# Patient Record
Sex: Male | Born: 1950 | Race: White | Hispanic: No | Marital: Married | Smoking: Former smoker
Health system: Southern US, Community
[De-identification: ages and names within clinical notes are randomized; demographics above are authoritative.]

---

## 2019-12-17 DIAGNOSIS — I4891 Unspecified atrial fibrillation: Secondary | ICD-10-CM

## 2020-01-31 ENCOUNTER — Inpatient Hospital Stay (HOSPITAL_COMMUNITY): Payer: Medicare HMO

## 2020-01-31 ENCOUNTER — Inpatient Hospital Stay (HOSPITAL_COMMUNITY)
Admission: AD | Admit: 2020-01-31 | Discharge: 2020-02-17 | DRG: 207 | Disposition: E | Payer: Medicare HMO | Source: Other Acute Inpatient Hospital | Attending: Internal Medicine | Admitting: Internal Medicine

## 2020-01-31 DIAGNOSIS — N179 Acute kidney failure, unspecified: Secondary | ICD-10-CM | POA: Diagnosis present

## 2020-01-31 DIAGNOSIS — Z66 Do not resuscitate: Secondary | ICD-10-CM | POA: Diagnosis not present

## 2020-01-31 DIAGNOSIS — E669 Obesity, unspecified: Secondary | ICD-10-CM | POA: Diagnosis present

## 2020-01-31 DIAGNOSIS — G9341 Metabolic encephalopathy: Secondary | ICD-10-CM | POA: Diagnosis present

## 2020-01-31 DIAGNOSIS — G936 Cerebral edema: Secondary | ICD-10-CM | POA: Diagnosis present

## 2020-01-31 DIAGNOSIS — Z4659 Encounter for fitting and adjustment of other gastrointestinal appliance and device: Secondary | ICD-10-CM

## 2020-01-31 DIAGNOSIS — L8990 Pressure ulcer of unspecified site, unspecified stage: Secondary | ICD-10-CM | POA: Insufficient documentation

## 2020-01-31 DIAGNOSIS — G9340 Encephalopathy, unspecified: Secondary | ICD-10-CM

## 2020-01-31 DIAGNOSIS — E785 Hyperlipidemia, unspecified: Secondary | ICD-10-CM | POA: Diagnosis present

## 2020-01-31 DIAGNOSIS — R0600 Dyspnea, unspecified: Secondary | ICD-10-CM

## 2020-01-31 DIAGNOSIS — R220 Localized swelling, mass and lump, head: Secondary | ICD-10-CM | POA: Diagnosis not present

## 2020-01-31 DIAGNOSIS — J440 Chronic obstructive pulmonary disease with acute lower respiratory infection: Secondary | ICD-10-CM | POA: Diagnosis present

## 2020-01-31 DIAGNOSIS — J9601 Acute respiratory failure with hypoxia: Secondary | ICD-10-CM | POA: Diagnosis not present

## 2020-01-31 DIAGNOSIS — R0603 Acute respiratory distress: Secondary | ICD-10-CM | POA: Diagnosis not present

## 2020-01-31 DIAGNOSIS — J9621 Acute and chronic respiratory failure with hypoxia: Secondary | ICD-10-CM | POA: Diagnosis present

## 2020-01-31 DIAGNOSIS — Z6841 Body Mass Index (BMI) 40.0 and over, adult: Secondary | ICD-10-CM | POA: Diagnosis not present

## 2020-01-31 DIAGNOSIS — Z0189 Encounter for other specified special examinations: Secondary | ICD-10-CM

## 2020-01-31 DIAGNOSIS — F329 Major depressive disorder, single episode, unspecified: Secondary | ICD-10-CM | POA: Diagnosis present

## 2020-01-31 DIAGNOSIS — R4182 Altered mental status, unspecified: Secondary | ICD-10-CM | POA: Diagnosis not present

## 2020-01-31 DIAGNOSIS — Z9289 Personal history of other medical treatment: Secondary | ICD-10-CM

## 2020-01-31 DIAGNOSIS — I4891 Unspecified atrial fibrillation: Secondary | ICD-10-CM | POA: Diagnosis present

## 2020-01-31 DIAGNOSIS — R918 Other nonspecific abnormal finding of lung field: Secondary | ICD-10-CM | POA: Diagnosis not present

## 2020-01-31 DIAGNOSIS — Z7189 Other specified counseling: Secondary | ICD-10-CM | POA: Diagnosis not present

## 2020-01-31 DIAGNOSIS — Z515 Encounter for palliative care: Secondary | ICD-10-CM | POA: Diagnosis not present

## 2020-01-31 DIAGNOSIS — R739 Hyperglycemia, unspecified: Secondary | ICD-10-CM | POA: Diagnosis not present

## 2020-01-31 DIAGNOSIS — Z87891 Personal history of nicotine dependence: Secondary | ICD-10-CM

## 2020-01-31 DIAGNOSIS — I609 Nontraumatic subarachnoid hemorrhage, unspecified: Secondary | ICD-10-CM | POA: Diagnosis present

## 2020-01-31 DIAGNOSIS — Z79899 Other long term (current) drug therapy: Secondary | ICD-10-CM | POA: Diagnosis not present

## 2020-01-31 DIAGNOSIS — J9819 Other pulmonary collapse: Secondary | ICD-10-CM | POA: Diagnosis present

## 2020-01-31 DIAGNOSIS — J181 Lobar pneumonia, unspecified organism: Secondary | ICD-10-CM | POA: Diagnosis present

## 2020-01-31 DIAGNOSIS — R410 Disorientation, unspecified: Secondary | ICD-10-CM | POA: Diagnosis not present

## 2020-01-31 DIAGNOSIS — Z8616 Personal history of COVID-19: Secondary | ICD-10-CM

## 2020-01-31 DIAGNOSIS — G9389 Other specified disorders of brain: Secondary | ICD-10-CM

## 2020-01-31 DIAGNOSIS — C3431 Malignant neoplasm of lower lobe, right bronchus or lung: Secondary | ICD-10-CM | POA: Diagnosis present

## 2020-01-31 DIAGNOSIS — G4700 Insomnia, unspecified: Secondary | ICD-10-CM | POA: Diagnosis present

## 2020-01-31 DIAGNOSIS — Z452 Encounter for adjustment and management of vascular access device: Secondary | ICD-10-CM

## 2020-01-31 DIAGNOSIS — C7931 Secondary malignant neoplasm of brain: Secondary | ICD-10-CM

## 2020-01-31 DIAGNOSIS — J9600 Acute respiratory failure, unspecified whether with hypoxia or hypercapnia: Secondary | ICD-10-CM

## 2020-01-31 DIAGNOSIS — I10 Essential (primary) hypertension: Secondary | ICD-10-CM | POA: Diagnosis present

## 2020-01-31 DIAGNOSIS — R05 Cough: Secondary | ICD-10-CM | POA: Diagnosis not present

## 2020-01-31 DIAGNOSIS — J9589 Other postprocedural complications and disorders of respiratory system, not elsewhere classified: Secondary | ICD-10-CM

## 2020-01-31 DIAGNOSIS — E869 Volume depletion, unspecified: Secondary | ICD-10-CM | POA: Diagnosis present

## 2020-01-31 DIAGNOSIS — Z9981 Dependence on supplemental oxygen: Secondary | ICD-10-CM | POA: Diagnosis not present

## 2020-01-31 DIAGNOSIS — E870 Hyperosmolality and hypernatremia: Secondary | ICD-10-CM | POA: Diagnosis not present

## 2020-01-31 DIAGNOSIS — J9690 Respiratory failure, unspecified, unspecified whether with hypoxia or hypercapnia: Secondary | ICD-10-CM | POA: Diagnosis not present

## 2020-01-31 DIAGNOSIS — R579 Shock, unspecified: Secondary | ICD-10-CM | POA: Diagnosis not present

## 2020-01-31 DIAGNOSIS — D649 Anemia, unspecified: Secondary | ICD-10-CM | POA: Diagnosis present

## 2020-01-31 LAB — CBC
HCT: 35 % — ABNORMAL LOW (ref 39.0–52.0)
Hemoglobin: 10.8 g/dL — ABNORMAL LOW (ref 13.0–17.0)
MCH: 26 pg (ref 26.0–34.0)
MCHC: 30.9 g/dL (ref 30.0–36.0)
MCV: 84.1 fL (ref 80.0–100.0)
Platelets: 278 10*3/uL (ref 150–400)
RBC: 4.16 MIL/uL — ABNORMAL LOW (ref 4.22–5.81)
RDW: 15.9 % — ABNORMAL HIGH (ref 11.5–15.5)
WBC: 13.2 10*3/uL — ABNORMAL HIGH (ref 4.0–10.5)
nRBC: 0 % (ref 0.0–0.2)

## 2020-01-31 LAB — COMPREHENSIVE METABOLIC PANEL
ALT: 20 U/L (ref 0–44)
AST: 23 U/L (ref 15–41)
Albumin: 2.2 g/dL — ABNORMAL LOW (ref 3.5–5.0)
Alkaline Phosphatase: 108 U/L (ref 38–126)
Anion gap: 13 (ref 5–15)
BUN: 52 mg/dL — ABNORMAL HIGH (ref 8–23)
CO2: 23 mmol/L (ref 22–32)
Calcium: 9.4 mg/dL (ref 8.9–10.3)
Chloride: 99 mmol/L (ref 98–111)
Creatinine, Ser: 2.06 mg/dL — ABNORMAL HIGH (ref 0.61–1.24)
GFR calc Af Amer: 38 mL/min — ABNORMAL LOW (ref >60)
GFR calc non Af Amer: 33 mL/min — ABNORMAL LOW (ref >60)
Glucose, Bld: 95 mg/dL (ref 70–99)
Potassium: 3.9 mmol/L (ref 3.5–5.1)
Sodium: 135 mmol/L (ref 135–145)
Total Bilirubin: 1 mg/dL (ref 0.3–1.2)
Total Protein: 6.9 g/dL (ref 6.5–8.1)

## 2020-01-31 LAB — SARS CORONAVIRUS 2 (TAT 6-24 HRS): SARS Coronavirus 2: NEGATIVE

## 2020-01-31 IMAGING — MR MR HEAD W/O CM
14 series · 48 of 48 positions shown · non-contrast
Comparison: Prior head CT from [DATE].

CLINICAL DATA: Follow-up examination for subarachnoid hemorrhage.
History of right lung mass, smoking.

EXAM:
MRI HEAD WITHOUT CONTRAST
TECHNIQUE: Multiplanar, multiecho pulse sequences of the brain and surrounding
structures were obtained without intravenous contrast.

[Series 5: DWI · axial · 3.0mm · 1.04mm/px · z∈[-55,+104]mm · 7 of 118 slices shown (1 of 4)]
[im 1/118]
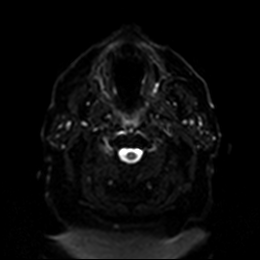
[im 20/118]
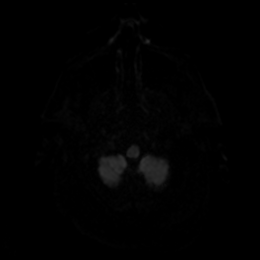
[im 40/118]
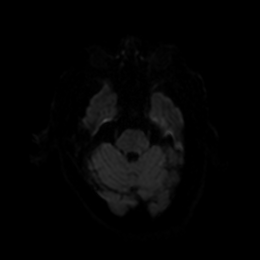
[im 59/118]
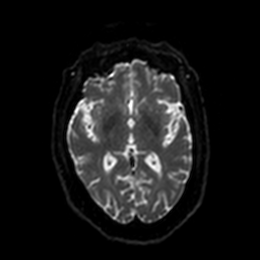
[im 79/118]
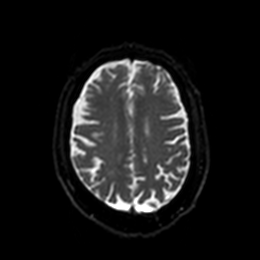
[im 98/118]
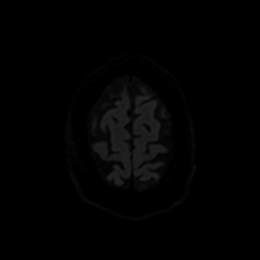
[im 118/118]
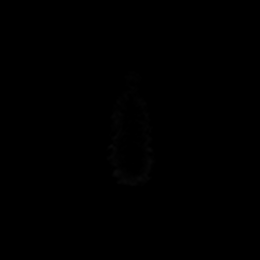

[Series 6: DWI · axial · 3.0mm · 1.04mm/px · z∈[-55,+104]mm · 4 of 59 slices shown (2 of 4)]
[im 1/59]
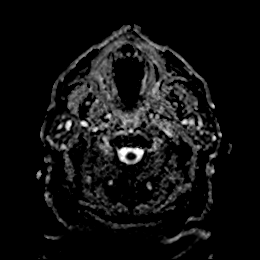
[im 20/59]
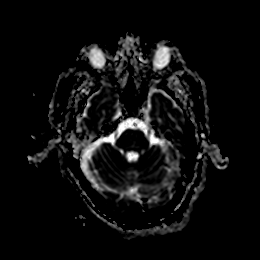
[im 39/59]
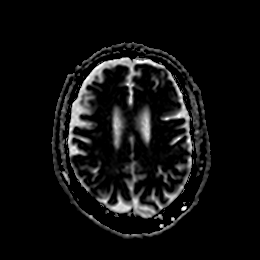
[im 59/59]
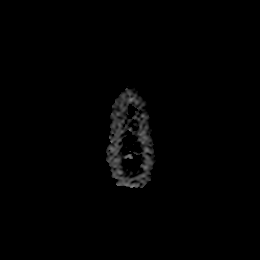

[Series 7: DWI · coronal · 4.0mm · 0.88mm/px · 6 of 78 slices shown (3 of 4)]
[im 1/78]
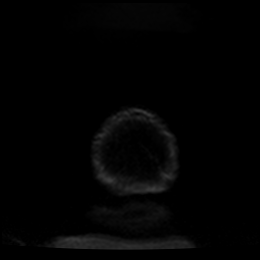
[im 16/78]
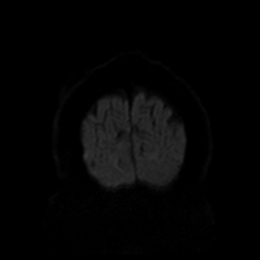
[im 31/78]
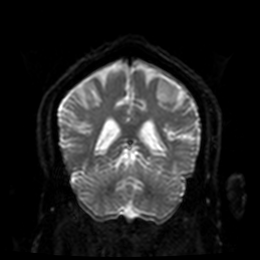
[im 47/78]
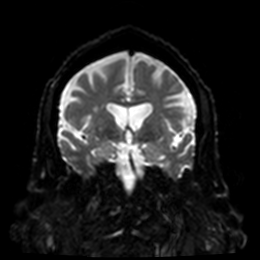
[im 62/78]
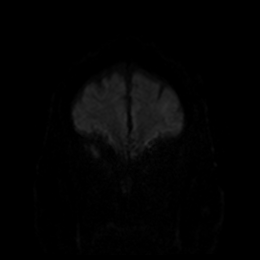
[im 78/78]
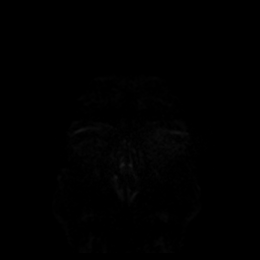

[Series 8: DWI · coronal · 4.0mm · 0.88mm/px · 3 of 39 slices shown (4 of 4)]
[im 1/39]
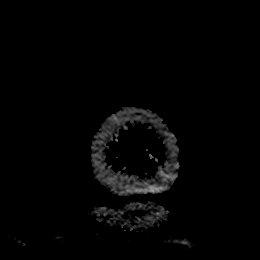
[im 20/39]
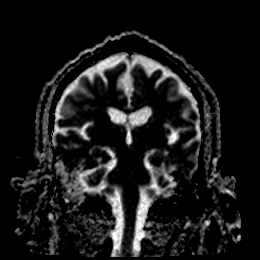
[im 39/39]
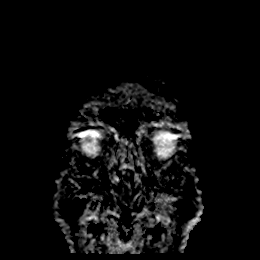

[Series 9: T1 · sagittal · 5.0mm · 0.81mm/px · 2 of 23 slices shown]
[im 1/23]
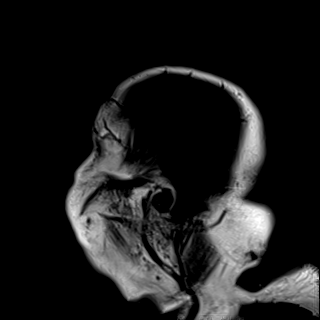
[im 23/23]
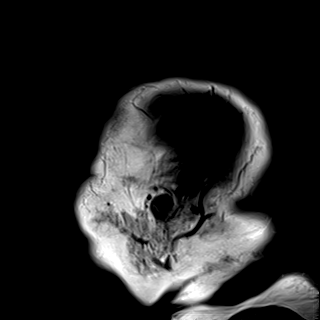

[Series 10: T2 · axial · 5.0mm · 0.75mm/px · z∈[-52,+80]mm · 2 of 25 slices shown (1 of 2)]
[im 1/25]
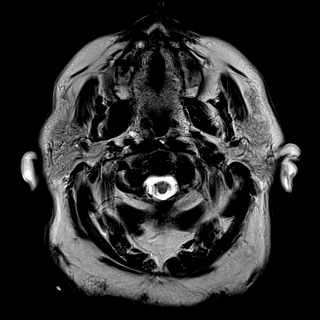
[im 25/25]
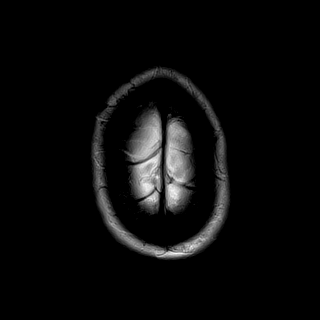

[Series 11: mag_images · axial · 3.0mm · 0.94mm/px · z∈[-57,+105]mm · 4 of 60 slices shown]
[im 1/60]
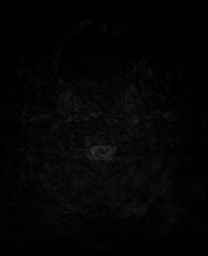
[im 20/60]
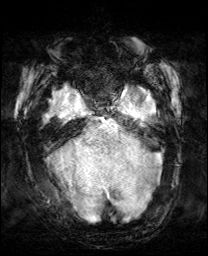
[im 40/60]
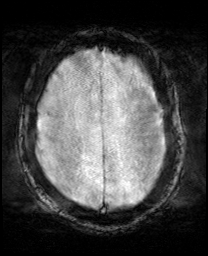
[im 60/60]
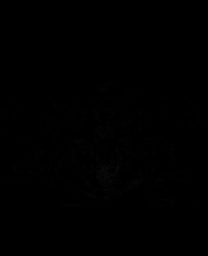

[Series 12: pha_images · axial · 3.0mm · 0.94mm/px · z∈[-54,+97]mm · 4 of 56 slices shown]
[im 1/56]
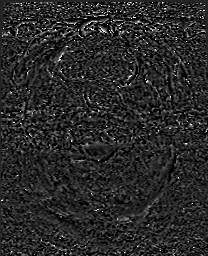
[im 19/56]
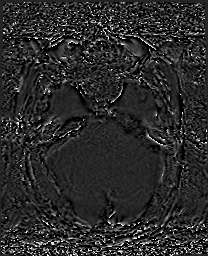
[im 37/56]
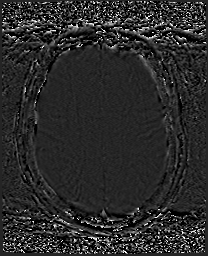
[im 56/56]
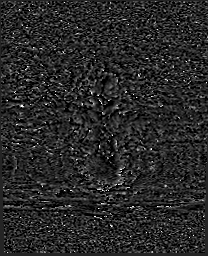

[Series 13: swi_images · axial · 3.0mm · 0.94mm/px · z∈[-57,+105]mm · 4 of 60 slices shown]
[im 1/60]
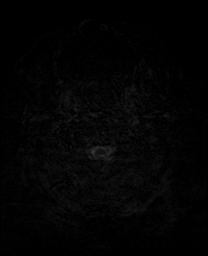
[im 20/60]
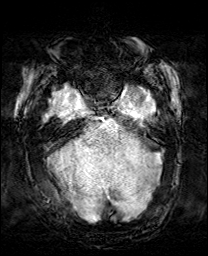
[im 40/60]
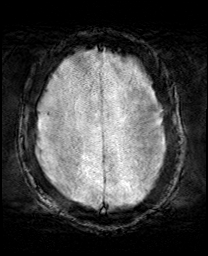
[im 60/60]
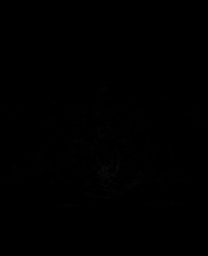

[Series 14: mip_images(sw) · axial · 24.0mm · 0.94mm/px · z∈[-47,+95]mm · 4 of 53 slices shown]
[im 1/53]
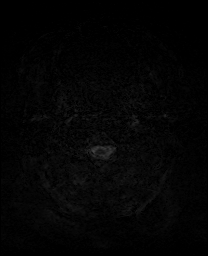
[im 18/53]
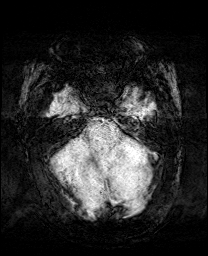
[im 35/53]
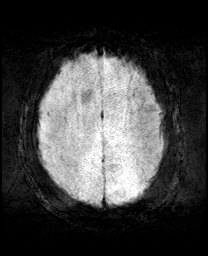
[im 53/53]
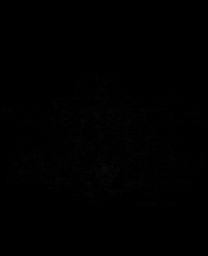

[Series 15: FLAIR · axial · 5.0mm · 0.90mm/px · z∈[-53,+78]mm · 2 of 25 slices shown (1 of 2)]
[im 1/25]
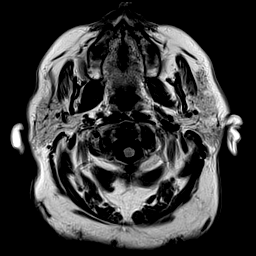
[im 25/25]
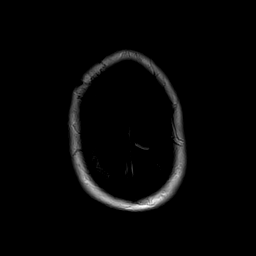

[Series 16: FLAIR · axial · 5.0mm · 0.90mm/px · z∈[-44,+87]mm · 2 of 25 slices shown (2 of 2)]
[im 1/25]
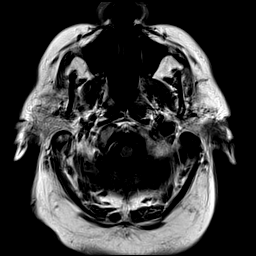
[im 25/25]
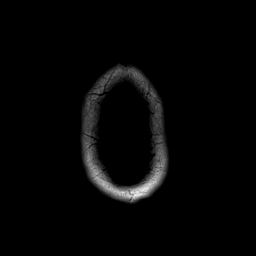

[Series 18: ax hemo · axial · 5.0mm · 0.86mm/px · z∈[-45,+86]mm · 2 of 25 slices shown]
[im 1/25]
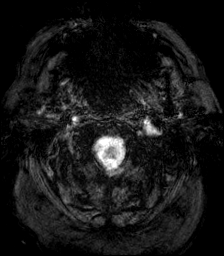
[im 25/25]
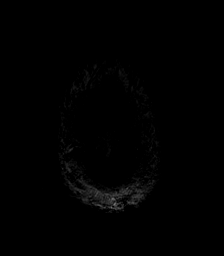

[Series 19: T2 · coronal · 5.0mm · 0.72mm/px · 2 of 29 slices shown (2 of 2)]
[im 1/29]
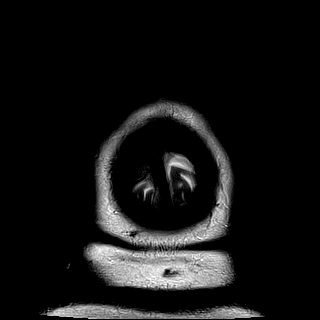
[im 29/29]
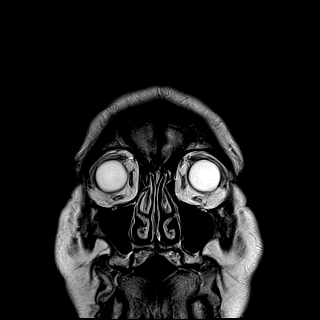

[48 of 48 positions shown; findings below may reference images not displayed]

FINDINGS: Brain: Examination moderately degraded by motion artifact.

Diffuse prominence of the CSF containing spaces compatible with
generalized age-related cerebral atrophy. Patchy T2/FLAIR
hyperintensity within the periventricular and deep white matter both
cerebral hemispheres most consistent with chronic small vessel
ischemic disease, mild to moderate in nature.

No abnormal foci of restricted diffusion to suggest acute or
subacute ischemia. No encephalomalacia to suggest chronic cortical
infarction.

There is an approximate 13 x 14 mm cortically based mass positioned
at the anterior left frontal lobe, corresponding with abnormality
seen on prior CT (series 15, image 17 associated susceptibility
artifact compatible with blood products, also seen on prior CT. No
other definite discrete masses identified on this noncontrast
examination. No midline shift. No hydrocephalus. No extra-axial
fluid collection. Pituitary gland suprasellar region normal. Midline
structures intact.). Exact measurements somewhat difficult given
lack of IV contrast and motion artifact. Small amount of localized
vasogenic edema without significant regional mass effect.

Vascular: Major intracranial vascular flow voids are maintained.

Skull and upper cervical spine: Craniocervical junction within
normal limits. Visualized upper cervical spine grossly unremarkable.
Bone marrow signal intensity within normal limits. No focal marrow
replacing lesion. Scalp soft tissues unremarkable.

Sinuses/Orbits: Patient status post ocular lens replacement on the
left. Globes and orbital soft tissues demonstrate no acute finding.
Paranasal sinuses are clear.

Other: Right mastoid and middle ear effusion noted, of uncertain
significance. Visualized nasopharynx grossly unremarkable.
IMPRESSION: 1. Approximate 14 mm cortically based hemorrhagic mass involving the
anterior left frontal lobe, corresponding with abnormality seen on
prior CT. Mild localized vasogenic edema without significant
regional mass effect. Finding is indeterminate, but most concerning
for a possible solitary intracranial metastasis given the patient's
pulmonary history. Primary CNS neoplasm could also be considered.
Further assessment with postcontrast imaging recommended for
complete evaluation.
2. Underlying mild age-related cerebral atrophy with
mild-to-moderate chronic microvascular ischemic disease.

## 2020-01-31 IMAGING — US US RENAL
1 series · 14 of 25 positions shown · non-contrast
Comparison: [DATE]

CLINICAL DATA: Acute renal insufficiency, renal cysts, renal
calculi

EXAM:
RENAL / URINARY TRACT ULTRASOUND COMPLETE

[Series 1: us renal · 43 acquisitions, 14 frames shown]
[im 1/43]
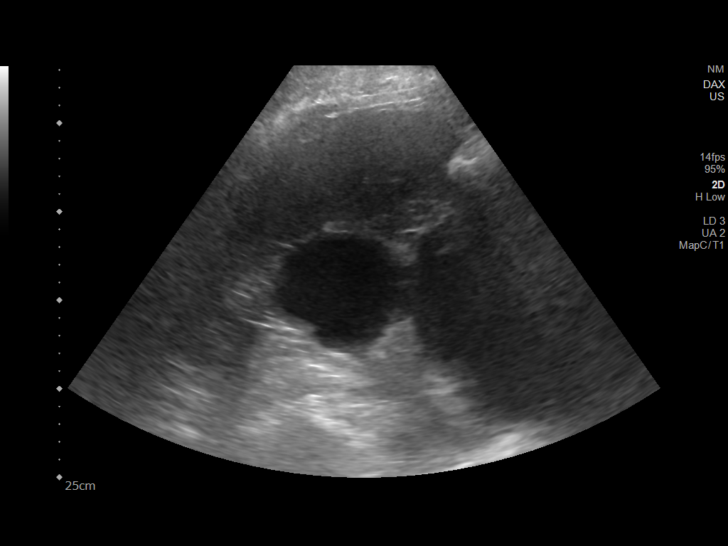
[im 4/43]
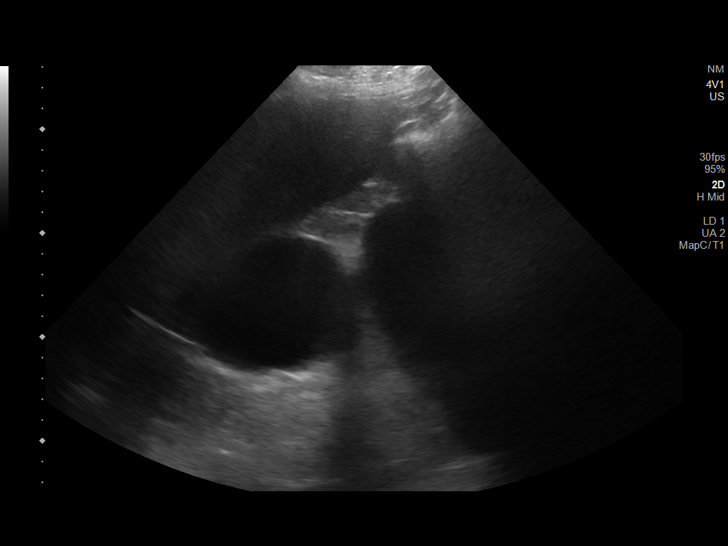
[im 8/43]
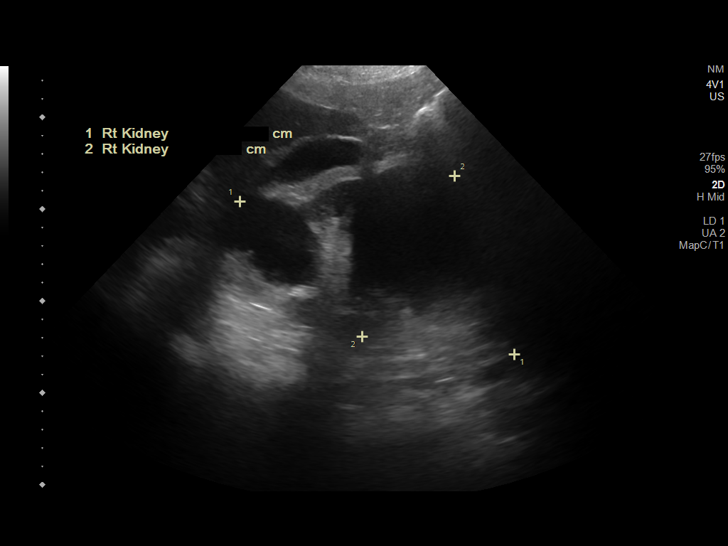
[im 11/43]
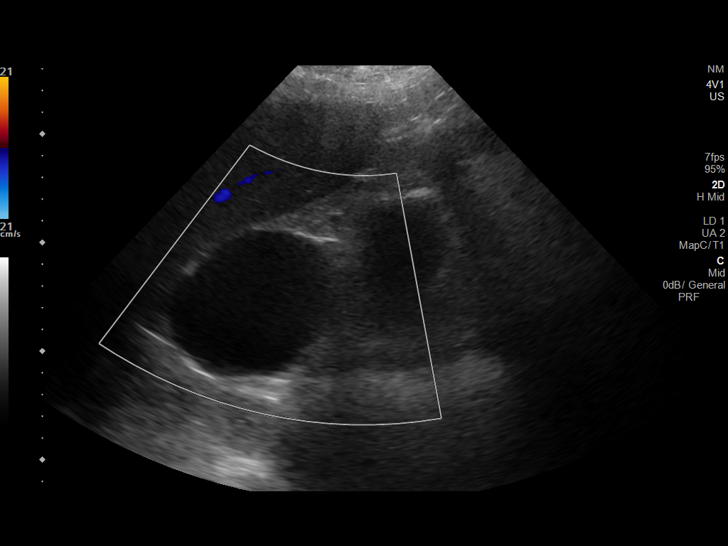
[im 15/43]
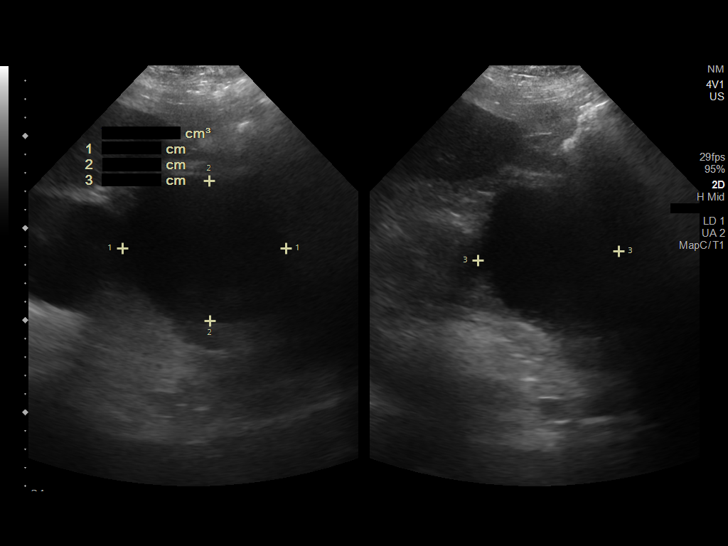
[im 16/43]
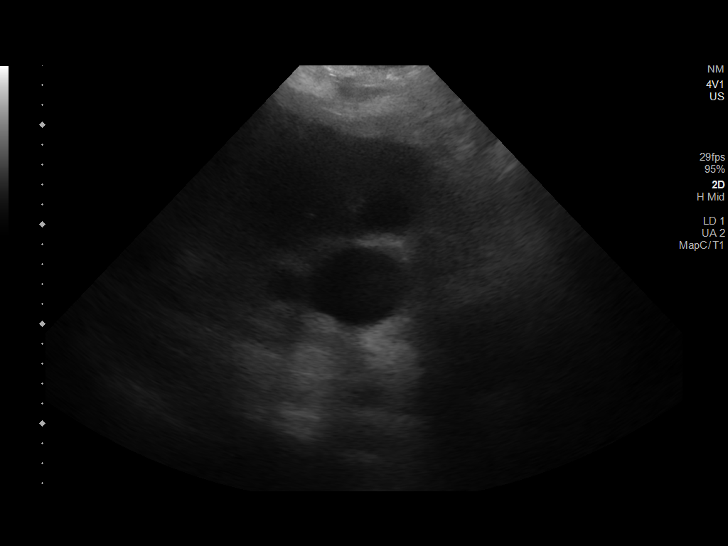
[im 20/43]
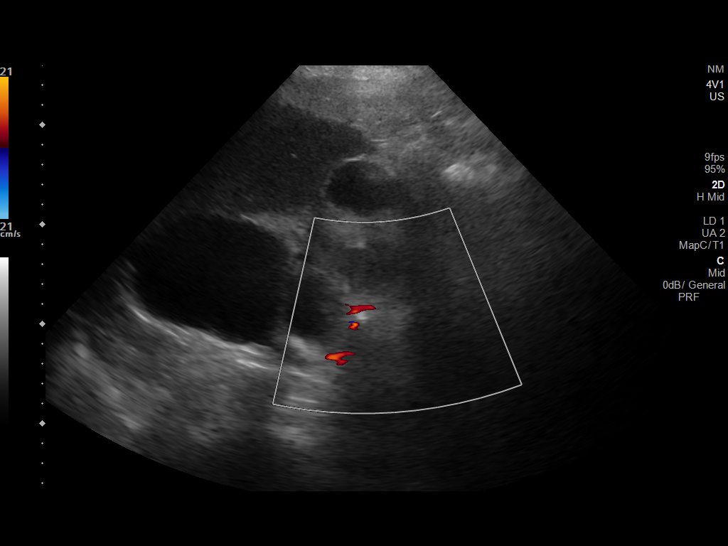
[im 23/43]
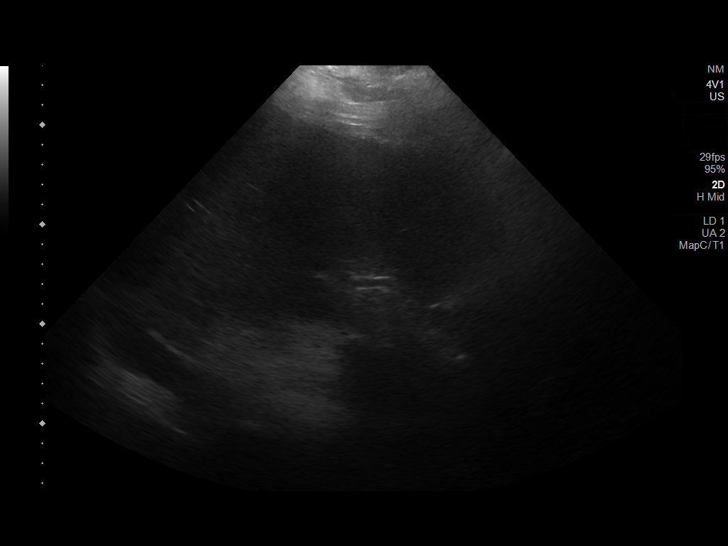
[im 27/43]
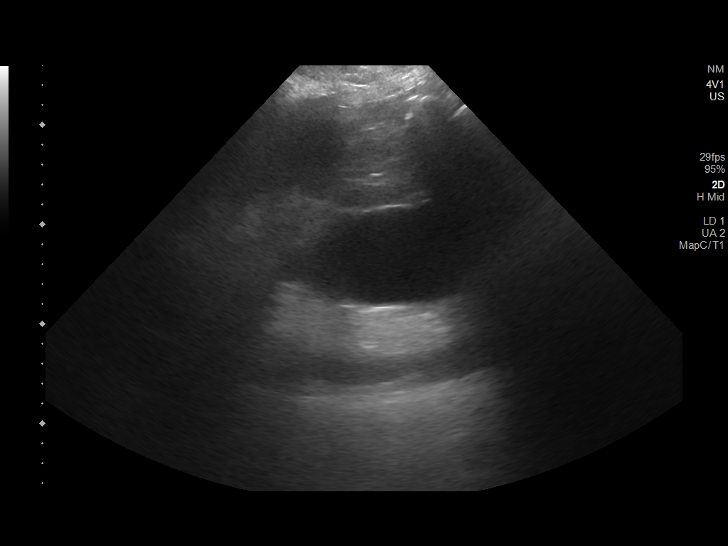
[im 29/43]
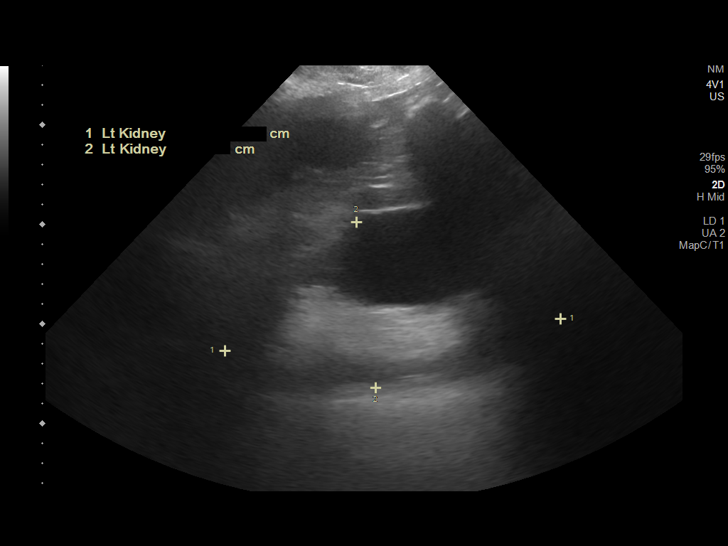
[im 32/43]
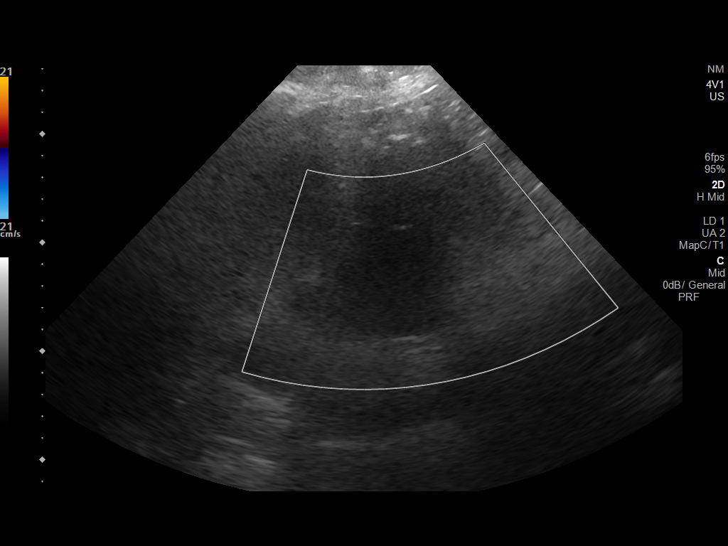
[im 36/43]
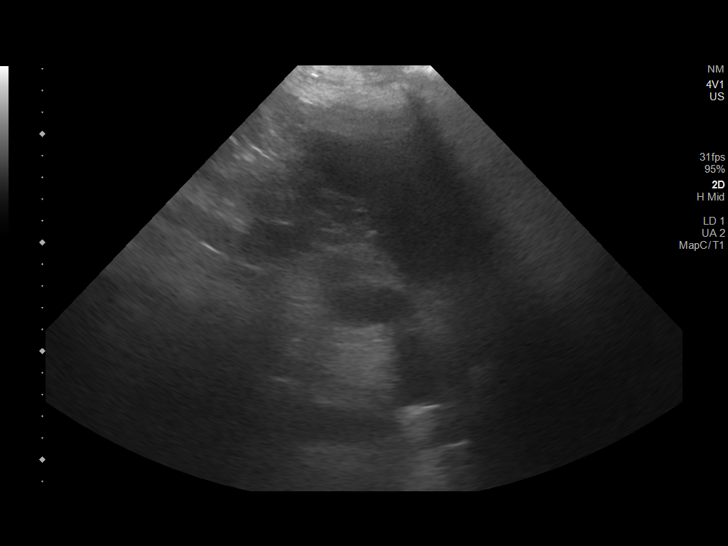
[im 39/43]
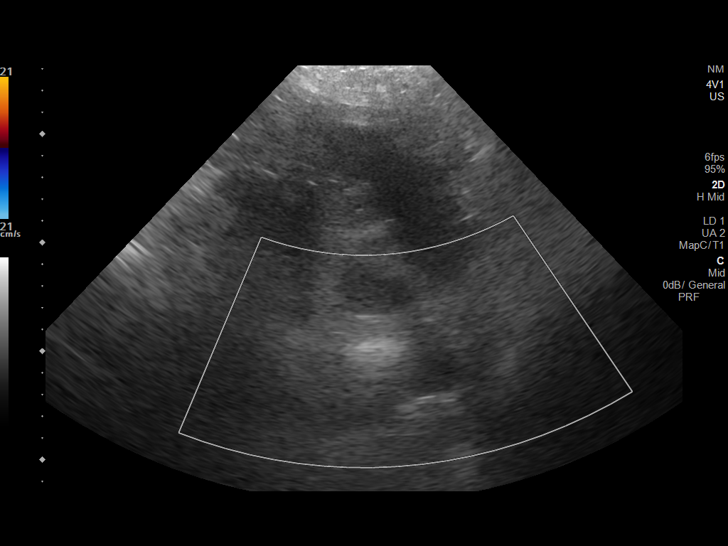
[im 43/43]
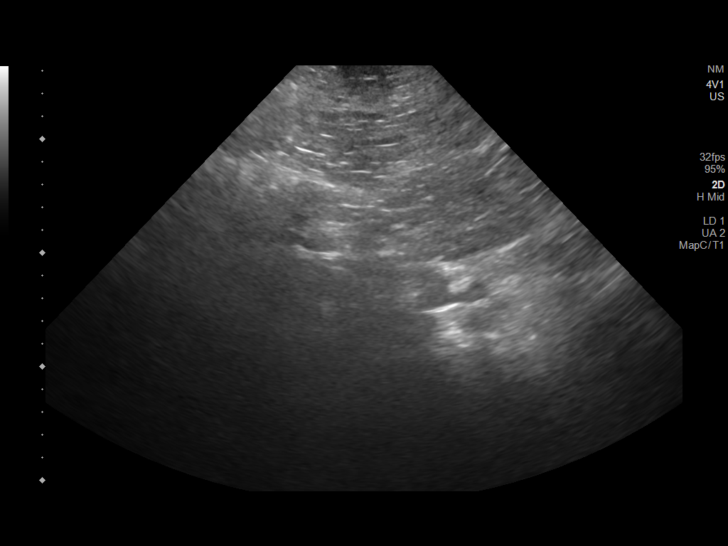

[14 of 25 positions shown; findings below may reference images not displayed]

FINDINGS: Right Kidney:

Renal measurements: 17.1 x 10.1 x 7.7 cm = volume: 696 mL. Numerous
right renal cysts are identified measuring up to 8.9 cm, unchanged
since recent CT. There is increased renal cortical echotexture.

Left Kidney:

Renal measurements: 16.9 x 8.4 by 7.1 cm = volume: 524 mL. Multiple
left renal cysts are identified largest measuring 6.5 cm. Mild
increased renal cortical echotexture.

Bladder:

The bladder is decompressed.

Other:

None.
IMPRESSION: 1. Numerous bilateral renal cortical cysts.
2. Increased renal cortical echotexture consistent with medical
renal disease.

## 2020-01-31 IMAGING — DX DG CHEST 1V PORT
1 series · 1 of 1 positions shown · non-contrast
Comparison: [DATE] [DATE] a.m, chest CT [DATE].

CLINICAL DATA: Dyspnea

EXAM:
PORTABLE CHEST 1 VIEW

[chest ap]
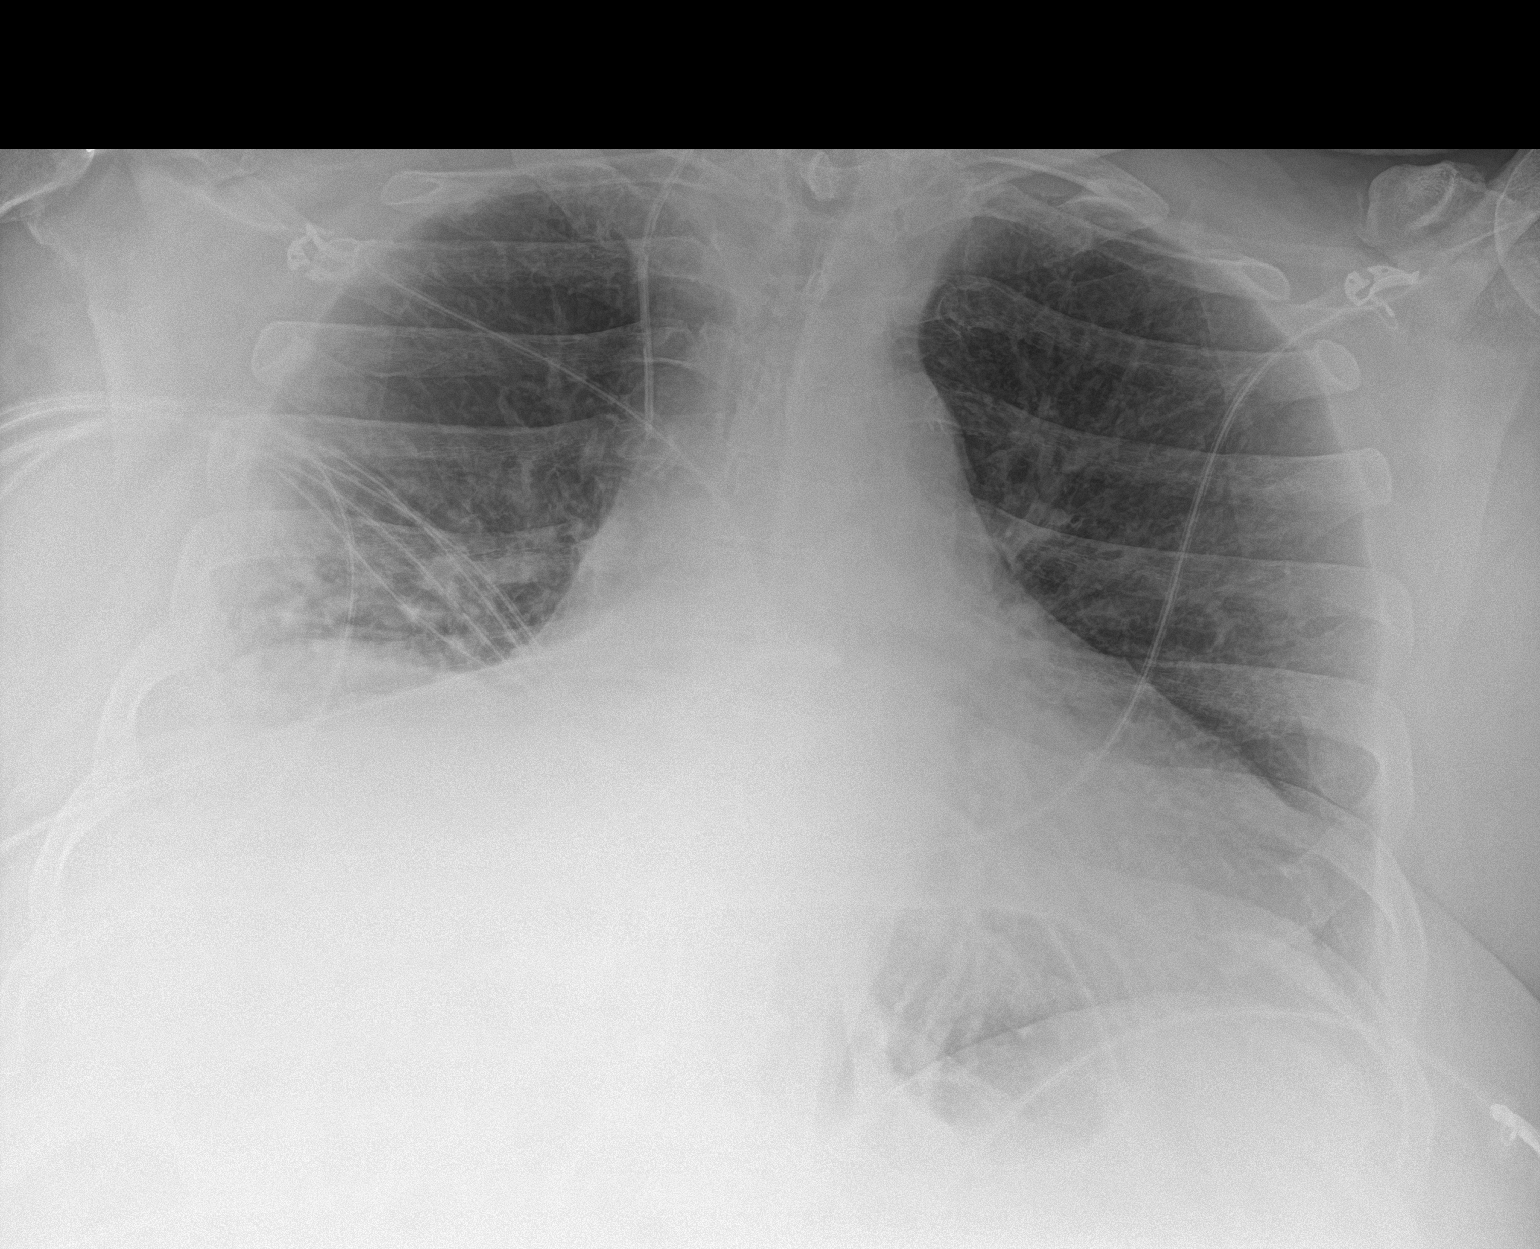

[1 of 1 positions shown; findings below may reference images not displayed]

FINDINGS: Single frontal view of the chest demonstrates stable right internal
jugular catheter tip overlying superior vena cava. The cardiac
silhouette is enlarged but stable. There is persistent but improving
right basilar airspace disease. No effusion or pneumothorax.
IMPRESSION: 1. Persistent but improving right basilar airspace disease, most
compatible with postobstructive bronchopneumonia. Please refer to
prior chest CT [DATE] describing a central obstructive right
hilar mass.

## 2020-01-31 MED ORDER — SODIUM CHLORIDE 0.9% FLUSH
10.0000 mL | Freq: Two times a day (BID) | INTRAVENOUS | Status: DC
  Administered 2020-01-31: 22:00:00 10 mL
  Administered 2020-02-01: 21:00:00 20 mL
  Administered 2020-02-01 – 2020-02-02 (×3): 10 mL

## 2020-01-31 MED ORDER — SODIUM CHLORIDE 0.9 % IV SOLN
1.0000 g | INTRAVENOUS | Status: DC
  Administered 2020-01-31: 21:00:00 1 g via INTRAVENOUS
  Filled 2020-01-31: qty 10

## 2020-01-31 MED ORDER — TRAZODONE HCL 50 MG PO TABS: 25.0000 mg | ORAL_TABLET | Freq: Every evening | ORAL | Status: DC | PRN

## 2020-01-31 MED ORDER — QUETIAPINE FUMARATE 25 MG PO TABS
25.0000 mg | ORAL_TABLET | Freq: Every day | ORAL | Status: AC
  Administered 2020-01-31 – 2020-02-01 (×2): 25 mg via ORAL
  Filled 2020-01-31 (×2): qty 1

## 2020-01-31 MED ORDER — IPRATROPIUM-ALBUTEROL 0.5-2.5 (3) MG/3ML IN SOLN
3.0000 mL | Freq: Four times a day (QID) | RESPIRATORY_TRACT | Status: DC
  Filled 2020-01-31: qty 3

## 2020-01-31 MED ORDER — SODIUM CHLORIDE 0.9 % IV SOLN: INTRAVENOUS | Status: DC

## 2020-01-31 MED ORDER — ACETAMINOPHEN 325 MG PO TABS
650.0000 mg | ORAL_TABLET | Freq: Four times a day (QID) | ORAL | Status: DC | PRN
  Administered 2020-02-02 – 2020-02-15 (×4): 650 mg via ORAL
  Filled 2020-01-31 (×4): qty 2

## 2020-01-31 MED ORDER — ONDANSETRON HCL 4 MG PO TABS
4.0000 mg | ORAL_TABLET | Freq: Four times a day (QID) | ORAL | Status: DC | PRN
  Filled 2020-01-31: qty 1

## 2020-01-31 MED ORDER — LEVETIRACETAM IN NACL 1000 MG/100ML IV SOLN
1000.0000 mg | INTRAVENOUS | Status: AC
  Administered 2020-01-31: 22:00:00 1000 mg via INTRAVENOUS
  Filled 2020-01-31: qty 100

## 2020-01-31 MED ORDER — SODIUM CHLORIDE 0.9 % IV SOLN
500.0000 mg | INTRAVENOUS | Status: DC
  Administered 2020-01-31 – 2020-02-08 (×9): 500 mg via INTRAVENOUS
  Filled 2020-01-31 (×10): qty 500

## 2020-01-31 MED ORDER — BUDESONIDE 0.25 MG/2ML IN SUSP
0.2500 mg | Freq: Two times a day (BID) | RESPIRATORY_TRACT | Status: DC
  Filled 2020-01-31 (×2): qty 2

## 2020-01-31 MED ORDER — ACETAMINOPHEN 650 MG RE SUPP: 650.0000 mg | Freq: Four times a day (QID) | RECTAL | Status: DC | PRN

## 2020-01-31 MED ORDER — LEVETIRACETAM 500 MG PO TABS
500.0000 mg | ORAL_TABLET | Freq: Two times a day (BID) | ORAL | Status: DC
  Administered 2020-02-01 (×2): 500 mg via ORAL
  Filled 2020-01-31 (×2): qty 1

## 2020-01-31 MED ORDER — CHLORHEXIDINE GLUCONATE CLOTH 2 % EX PADS
6.0000 | MEDICATED_PAD | Freq: Every day | CUTANEOUS | Status: DC
  Administered 2020-01-31 – 2020-02-05 (×4): 6 via TOPICAL

## 2020-01-31 MED ORDER — SODIUM CHLORIDE 0.9% FLUSH: 10.0000 mL | INTRAVENOUS | Status: DC | PRN

## 2020-01-31 MED ORDER — ONDANSETRON HCL 4 MG/2ML IJ SOLN: 4.0000 mg | Freq: Four times a day (QID) | INTRAMUSCULAR | Status: DC | PRN

## 2020-01-31 MED ORDER — IPRATROPIUM-ALBUTEROL 0.5-2.5 (3) MG/3ML IN SOLN
3.0000 mL | Freq: Four times a day (QID) | RESPIRATORY_TRACT | Status: DC | PRN
  Administered 2020-02-07: 3 mL via RESPIRATORY_TRACT
  Filled 2020-01-31: qty 3

## 2020-01-31 MED ORDER — TRAMADOL HCL 50 MG PO TABS: 50.0000 mg | ORAL_TABLET | Freq: Four times a day (QID) | ORAL | Status: DC | PRN

## 2020-01-31 NOTE — Plan of Care (Signed)

## 2020-01-31 NOTE — H&P (Addendum)
History and Physical  Shane Avila XIP:[id number] DOB: [birth year] DOA: 02/03/2020  PCP: Bonnita Nasuti, MD Patient coming from: [facility]   I have personally briefly reviewed patient's old medical records in [place]   Chief Complaint: Confusion.   HPI: Shane Avila is a 69 y.o. male past medical history significant for A. fib, right lung mass smoker who presented to [facility] complaining of insomnia for 4 days duration.  He took some Xanax on Monday night but that did not help him.  Patient report chronic cough, no fevers, denies chest pain denies worsening shortness of breath.  Per family patient was diagnosed with a lung mass he was supposed to have biopsy done but he went into cardiac arrest during initial attempt.  Biopsy was rescheduled as an outpatient but patient missed appointment '' due to his brain telling him that he got healed, he is a Environmental education officer now " patient wife noticed that patient has been acting with bizarre behavior and talking nonsense.  Evaluation at [facility]: Patient was found to have white blood cell of 15, hemoglobin 10, BUN 61, creatinine 3.7 blood pressure low 85/45, gross hematuria.  He received IV fluids.  CT head showed a small hyperdense area within the left frontal lobe which likely represent a small area of subarachnoid hemorrhage, however cannot exclude underlying mass lesion with a small area of hemorrhage.  Will recommend MRI with contrast for further evaluation.  Portable chest x-ray showed persistent right pleural effusion with adjacent right basilar atelectasis versus airspace disease.  Obscured right heart border limits evaluation of a known right hilar subcarinal mass.  CT abdomen and pelvis: Reticulonodular and tree in bud opacities with posterior right lung base consolidation, these findings are concerning for multifocal pneumonia.  Trace right pleural effusion.  Unchanged bilateral renal cyst.  Nonobstructing  right renal calculi.  Diverticulosis without diverticulitis, aortic atherosclerosis.   Review of Systems: All systems reviewed and apart from history of presenting illness, are negative.  No past medical history on file.  PMH;  A. 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   Family History; unable to obtain from patient.   Prior to Admission medications   Not on File   Physical Exam: There were no vitals filed for this visit.   General exam: Moderately built and nourished patient, lying comfortably supine on the gurney.  Head, eyes and ENT: Nontraumatic and normocephalic. Pupils equally reacting to light and accommodation. Oral mucosa dry   Neck: Supple. No JVD, carotid bruit or thyromegaly.  Lymphatics: No lymphadenopathy.  Respiratory system: bilateral ronchus, tachypnea.   Cardiovascular system: S1 and S2 heard, RRR. No JVD, murmurs, gallops, clicks, trace edema.  Gastrointestinal system: Abdomen is nondistended, soft and nontender. Normal bowel sounds heard. No organomegaly or masses appreciated.  Central nervous system: Alert and oriented. Follows command, confuse, upper extremity strength 5/5, LE he didn't want to move it due to pain.   Extremities: trace edema,   Musculoskeletal system no muscle atrophy   Psychiatry: Pleasant and cooperative. Confuse.    Labs on Admission:  Basic Metabolic Panel: No results for input(s): NA, K, CL, CO2, GLUCOSE, BUN, CREATININE, CALCIUM, MG, PHOS in the last 168 hours. Liver Function Tests: No results for input(s): AST, ALT, ALKPHOS, BILITOT, PROT, ALBUMIN in the last 168 hours. No results for input(s): LIPASE, AMYLASE in the last 168 hours.  No results for input(s): AMMONIA in the last 168 hours. CBC: No results for input(s): WBC, NEUTROABS, HGB, HCT, MCV, PLT in the last 168 hours. Cardiac Enzymes: No results for  input(s): CKTOTAL, CKMB, CKMBINDEX, TROPONINI in the last 168 hours.  BNP (last 3 results) No results for input(s): PROBNP in the last 8760 hours. CBG: No results for input(s): GLUCAP in the last 168 hours.  Radiological Exams on Admission: No results found.  EKG: Independently reviewed. A fib on monitor. Will order EKG  Assessment/Plan Active Problems:   SAH (subarachnoid hemorrhage) (HCC)   AKI (acute kidney injury) ([place])   Lung mass   Lobar pneumonia ([place])   1-AKI; Patient presents with cr at 3, BUN 60, per [facility] records.  -Suspect related to hypovolemia, in setting diuretics use (losartan NCHTZ) poor oral intake.  -Continue with IV fluids.  -Check renal US -Monitor urine out put. He has foley catheter in place with 800 cc urine in bag.  -Check UA.   2-SAH; per CT scan, cant rule out mass with hemorrhage.  Discussed with Dr Lorraine Lax with neurology, proceed with MRI , no contrast due to renal failure. If patient has a mass will need oncology /neurosurgery consultations. If [facility] neurology will see in consultation.  -Neuro check. -Monitor BP. Avoid Blood thinner.   3-Multifocal PNA;  Continue with ceftriaxone and azithromycin.   4-A fib;  Med list from [place] [facility] listed Nevibolol hold for now due to hypotension. Hold aspirin.   5-Acute hypoxic resp failure; lung mass, PNA; History of COPD.  Continue with oxygen supplement.  Check chest  Xray.  Duonebulizer.  Pulmicort.  Awaiting med rec.   6-Acute metabolic encephalopathy;  Multifactorial, infection, AKI, dehydration, SAH ?  MRI rule out Mass.   7-Lung Mass;  Might need evaluation by pulmonology. Check chest x ray.  8-Anemia; check anemia panel.   DVT Prophylaxis: SCD no anticoagulation in the event of possible SAH Code Status: Full code Family Communication: unable to contact family.  Disposition Plan:   Time spent: 75 minutes.   Elmarie Shiley MD [facility]   02/02/2020, 5:34 PM

## 2020-01-31 NOTE — Consult Note (Addendum)
Neurology Consultation  Reason for Consult: Altered mental status, abnormal brain imaging findings at an outside hospital Referring Physician: Dr Tyrell Antonio  CC: Altered mental status, abnormal brain imaging findings  History is obtained from: Chart review from outside hospital, EMR review  HPI: Shane Avila is a 69 y.o. male past medical history of atrial fibrillation, lung mass, history of smoking, coming in to [facility] for difficulty falling asleep and he says possible overuse of Xanax. He was noted to be not acting like himself.  I was unable to speak with any family where he has been appearing confused over the past few days. Brain imaging in the form of CT scan was performed at the outside hospital and there was a concern for a small left frontal hyperdensity-small subarachnoid versus mass. No reports of seizure activity. MRI was recommended upon phone consultation with my day colleague, and due to deranged renal function only brain MRI without contrast was done which revealed that lesion to be likely a solitary met given the history of lung cancer. Patient continues to report that he knows that he is at [facility] and he is in the hospital because he had taken a few extra Xanax because he has been having a difficult time sleeping.  He also complains of severe back pain and leg pain due to his chronic arthritis.  He seems to be aware that he has a lung mass but does not know if that is cancerous or not.    ROS: Unable to reliably obtain  No past medical history on file. Past medical history as documented above  No family history on file. Unable to provide  Social History:   has no history on file for tobacco, alcohol, and drug. Past history of smoking documented in the H&P.  Unable to ascertain history of drug or tobacco abuse but urinary toxicology screen from the outside hospital was positive for THC and benzos.  Medications  Current Facility-Administered  Medications:  .  0.9 %  sodium chloride infusion, , Intravenous, Continuous, Regalado, Belkys A, MD, Last Rate: 75 mL/hr at 01/20/2020 1804, New Bag at 02/11/2020 1804 .  acetaminophen (TYLENOL) tablet 650 mg, 650 mg, Oral, Q6H PRN **OR** acetaminophen (TYLENOL) suppository 650 mg, 650 mg, Rectal, Q6H PRN, Regalado, Belkys A, MD .  azithromycin (ZITHROMAX) 500 mg in sodium chloride 0.9 % 250 mL IVPB, 500 mg, Intravenous, Q24H, Regalado, Belkys A, MD, Last Rate: 250 mL/hr at 01/23/2020 2000, 500 mg at 01/30/2020 2000 .  budesonide (PULMICORT) nebulizer solution 0.25 mg, 0.25 mg, Nebulization, BID, Regalado, Belkys A, MD .  cefTRIAXone (ROCEPHIN) 1 g in sodium chloride 0.9 % 100 mL IVPB, 1 g, Intravenous, Q24H, Regalado, Belkys A, MD .  ipratropium-albuterol (DUONEB) 0.5-2.5 (3) MG/3ML nebulizer solution 3 mL, 3 mL, Nebulization, Q6H, Regalado, Belkys A, MD .  ondansetron (ZOFRAN) tablet 4 mg, 4 mg, Oral, Q6H PRN **OR** ondansetron (ZOFRAN) injection 4 mg, 4 mg, Intravenous, Q6H PRN, Regalado, Belkys A, MD .  traMADol (ULTRAM) tablet 50 mg, 50 mg, Oral, Q6H PRN, Regalado, Belkys A, MD .  traZODone (DESYREL) tablet 25 mg, 25 mg, Oral, QHS PRN, Regalado, Belkys A, MD  Exam: Current vital signs: BP 91/65   Pulse 82   Temp 98.2 F (36.8 C) (Oral)   Resp (!) 26   SpO2 97%  Vital signs in last 24 hours: Temp:  [98.2 F (36.8 C)] 98.2 F (36.8 C) (02/12 1943) Pulse Rate:  [82-90] 82 (02/12 2030) Resp:  [16-26]  26 (02/12 2030) BP: (91-102)/(50-66) 91/65 (02/12 2030) SpO2:  [95 %-100 %] 97 % (02/12 2030) General: Very pale appearing obese man sleeping in bed opens eyes to voice and follows commands. HEENT: Cephalic atraumatic Lungs: Rhonchorous sounding chest bilaterally Cardiovascular: Regular rate rhythm Abdomen: Obese Extremities: Palms of both hands have a bluish hue, he has pedal edema bilaterally. Neurological exam Awake alert oriented to the fact that he is in the hospital, himself but not to  the exact date and time. Speech is not dysarthric. There is no evidence of aphasia but he has poor attention concentration. Cranial: Pupils equal round react light, extract movements intact, visual fields appear full, face is symmetric, tongue and palate midline, articulate intact. Motor exam: Both upper extremities are antigravity without any drift.  He refused to participate with a lower extremity exam citing severe pain as the reason but says he is able to feel me touching both his legs symmetrically. Sensory exam: Intact to light touch all over Coordination: Difficult to perform but no gross dysmetria in the upper extremities.  Did not perform any testing in the lower extremities Gait testing was deferred due to pain discomfort and patient cooperation.  Of note, on my evaluation the first systolic blood pressure was in the 50s.  Repeat blood pressure testing revealed systolic in the 09U.   Labs I have reviewed labs in epic and the results pertinent to this consultation are:  CBC    Component Value Date/Time   WBC 13.2 (H) 01/21/2020 1727   RBC 4.16 (L) 01/28/2020 1727   HGB 10.8 (L) 01/20/2020 1727   HCT 35.0 (L) 02/08/2020 1727   PLT 278 02/11/2020 1727   MCV 84.1 01/29/2020 1727   MCH 26.0 02/06/2020 1727   MCHC 30.9 02/15/2020 1727   RDW 15.9 (H) 01/27/2020 1727    CMP     Component Value Date/Time   NA 135 01/30/2020 1727   K 3.9 01/26/2020 1727   CL 99 02/05/2020 1727   CO2 23 01/29/2020 1727   GLUCOSE 95 01/25/2020 1727   BUN 52 (H) 01/27/2020 1727   CREATININE 2.06 (H) 01/27/2020 1727   CALCIUM 9.4 02/13/2020 1727   PROT 6.9 02/02/2020 1727   ALBUMIN 2.2 (L) 02/14/2020 1727   AST 23 01/30/2020 1727   ALT 20 02/01/2020 1727   ALKPHOS 108 02/09/2020 1727   BILITOT 1.0 02/13/2020 1727   GFRNONAA 33 (L) 01/21/2020 1727   GFRAA 38 (L) 01/26/2020 1727   Some pertinent outside labs include CRP of greater than 270, proBNP elevated in the 2000.   Imaging I have  reviewed the images obtained:  CT-scan of the brain reviewed in PACS.  Small area of hyperdensity in the left frontal lobe concerning for a subarachnoid versus an underlying mass MRI examination of the brain done at [facility] without contrast due to deranged renal function-reveals a 14 mm cortically based hemorrhagic mass in the anterior left frontal lobe corresponding with an abnormality seen on the prior CT along with mild localized vasogenic edema without significant regional mass-effect.  It is an indeterminate finding but most concerning for possible solitary intracranial meta stasis given the history of the lung findings.  Primary CNS neoplasm could also be considered.  Further imaging with contrast should be performed when able to.  Assessment:  69 year old male past history of atrial fibrillation, lung mass, chronic history of smoking, insomnia with chronic pain medication and sleep medication use, presenting for evaluation of confusion outside hospital noted  to have a suspicious finding for a possible subarachnoid versus mass in the left frontal lobe. Further imaging by MRI reveals a possible hemorrhagic metastatic lesion in the left frontal lobe which is cortically based. Most likely his confusion is secondary to this frontal lesion. Rule out seizure given the cortical location.  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 -I would hold off on adding steroids as he is already very insomniac and appears mildly agitated.  There is very mild local mass-effect based on imaging and I would wait for an oncology/neuro-oncology consultation prior to initiating steroid treatment -I would also avoid  sedating medications.  For sleep, I would recommend using low-dose Seroquel. -Gentle fluid resuscitation-500 cc normal saline for correction of hypotension.  Further management per primary team. -Once renal function improves with GFR above 40, do an MRI brain with and without contrast at that time to get a clear picture of this lesion.  I have discussed my plan with the on-call advanced provider, Rhunette Croft Blount-NP, for the [facility] team.  Please call neurology with questions  -- Amie Portland, MD [facility] Pager: [phone] If 7pm to 7am, please call on call as listed on AMION.

## 2020-01-31 NOTE — Plan of Care (Signed)
°  Problem: Coping: °Goal: Level of anxiety will decrease °Outcome: Progressing °  °

## 2020-02-01 ENCOUNTER — Inpatient Hospital Stay (HOSPITAL_COMMUNITY): Payer: Medicare HMO

## 2020-02-01 ENCOUNTER — Encounter (HOSPITAL_COMMUNITY): Payer: Self-pay | Admitting: Internal Medicine

## 2020-02-01 DIAGNOSIS — G9389 Other specified disorders of brain: Secondary | ICD-10-CM

## 2020-02-01 DIAGNOSIS — G9340 Encephalopathy, unspecified: Secondary | ICD-10-CM

## 2020-02-01 DIAGNOSIS — R918 Other nonspecific abnormal finding of lung field: Secondary | ICD-10-CM

## 2020-02-01 DIAGNOSIS — R4182 Altered mental status, unspecified: Secondary | ICD-10-CM

## 2020-02-01 LAB — URINALYSIS, ROUTINE W REFLEX MICROSCOPIC
Bacteria, UA: NONE SEEN
Bilirubin Urine: NEGATIVE
Glucose, UA: NEGATIVE mg/dL
Ketones, ur: NEGATIVE mg/dL
Nitrite: NEGATIVE
Protein, ur: NEGATIVE mg/dL
RBC / HPF: >50 RBC/hpf — ABNORMAL HIGH (ref 0–5)
Specific Gravity, Urine: 1.014 (ref 1.005–1.030)
pH: 6 (ref 5.0–8.0)

## 2020-02-01 LAB — RETICULOCYTES
Immature Retic Fract: 12.4 % (ref 2.3–15.9)
RBC.: 3.91 MIL/uL — ABNORMAL LOW (ref 4.22–5.81)
Retic Count, Absolute: 40.3 10*3/uL (ref 19.0–186.0)
Retic Ct Pct: 1 % (ref 0.4–3.1)

## 2020-02-01 LAB — CBC
HCT: 33.4 % — ABNORMAL LOW (ref 39.0–52.0)
Hemoglobin: 10.1 g/dL — ABNORMAL LOW (ref 13.0–17.0)
MCH: 25.7 pg — ABNORMAL LOW (ref 26.0–34.0)
MCHC: 30.2 g/dL (ref 30.0–36.0)
MCV: 85 fL (ref 80.0–100.0)
Platelets: 274 10*3/uL (ref 150–400)
RBC: 3.93 MIL/uL — ABNORMAL LOW (ref 4.22–5.81)
RDW: 15.9 % — ABNORMAL HIGH (ref 11.5–15.5)
WBC: 11.4 10*3/uL — ABNORMAL HIGH (ref 4.0–10.5)
nRBC: 0 % (ref 0.0–0.2)

## 2020-02-01 LAB — COMPREHENSIVE METABOLIC PANEL
ALT: 20 U/L (ref 0–44)
AST: 21 U/L (ref 15–41)
Albumin: 2.1 g/dL — ABNORMAL LOW (ref 3.5–5.0)
Alkaline Phosphatase: 111 U/L (ref 38–126)
Anion gap: 12 (ref 5–15)
BUN: 42 mg/dL — ABNORMAL HIGH (ref 8–23)
CO2: 25 mmol/L (ref 22–32)
Calcium: 9.1 mg/dL (ref 8.9–10.3)
Chloride: 100 mmol/L (ref 98–111)
Creatinine, Ser: 1.45 mg/dL — ABNORMAL HIGH (ref 0.61–1.24)
GFR calc Af Amer: 58 mL/min — ABNORMAL LOW (ref >60)
GFR calc non Af Amer: 50 mL/min — ABNORMAL LOW (ref >60)
Glucose, Bld: 86 mg/dL (ref 70–99)
Potassium: 3.6 mmol/L (ref 3.5–5.1)
Sodium: 137 mmol/L (ref 135–145)
Total Bilirubin: 1 mg/dL (ref 0.3–1.2)
Total Protein: 6.2 g/dL — ABNORMAL LOW (ref 6.5–8.1)

## 2020-02-01 LAB — MAGNESIUM: Magnesium: 1.9 mg/dL (ref 1.7–2.4)

## 2020-02-01 LAB — IRON AND TIBC
Iron: 21 ug/dL — ABNORMAL LOW (ref 45–182)
Saturation Ratios: 12 % — ABNORMAL LOW (ref 17.9–39.5)
TIBC: 181 ug/dL — ABNORMAL LOW (ref 250–450)
UIBC: 160 ug/dL

## 2020-02-01 LAB — HIV ANTIBODY (ROUTINE TESTING W REFLEX): HIV Screen 4th Generation wRfx: NONREACTIVE

## 2020-02-01 LAB — FERRITIN: Ferritin: 212 ng/mL (ref 24–336)

## 2020-02-01 LAB — PROTIME-INR
INR: 1.3 — ABNORMAL HIGH (ref 0.8–1.2)
Prothrombin Time: 16.1 seconds — ABNORMAL HIGH (ref 11.4–15.2)

## 2020-02-01 LAB — FOLATE: Folate: 2.9 ng/mL — ABNORMAL LOW (ref >5.9)

## 2020-02-01 LAB — VITAMIN B12: Vitamin B-12: 227 pg/mL (ref 180–914)

## 2020-02-01 IMAGING — MR MR HEAD W/O CM
9 series · 48 of 48 positions shown · non-contrast
Comparison: Brain MRI [DATE]

CLINICAL DATA: Brain mass or lesion. Additional history obtained
from electronic medical record: History of lung mass

EXAM:
MRI HEAD WITHOUT CONTRAST
TECHNIQUE: Multiplanar, multiecho pulse sequences of the brain and surrounding
structures were obtained without intravenous contrast.

[Series 5: DWI · axial · 3.0mm · 0.88mm/px · z∈[-76,+60]mm · 14 of 96 slices shown (1 of 4)]
[im 1/96]
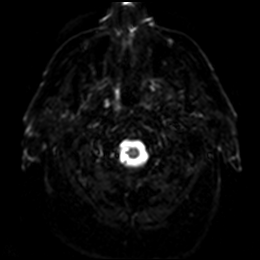
[im 8/96]
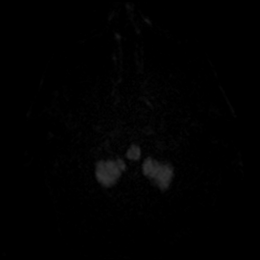
[im 15/96]
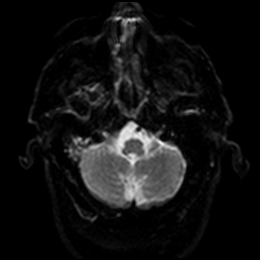
[im 22/96]
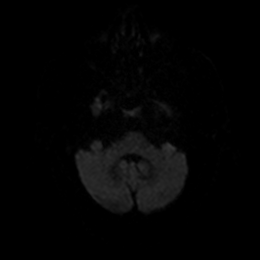
[im 30/96]
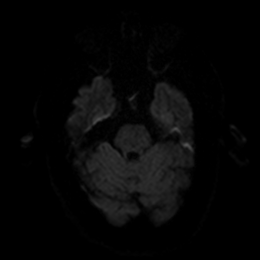
[im 37/96]
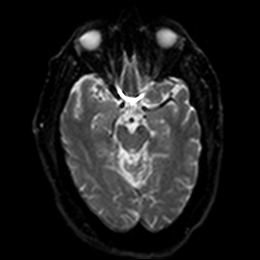
[im 44/96]
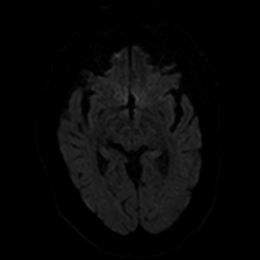
[im 52/96]
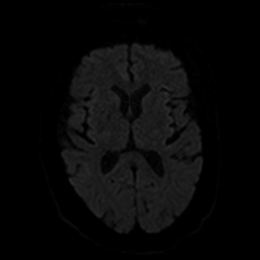
[im 59/96]
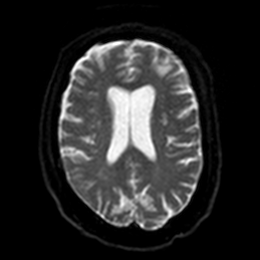
[im 66/96]
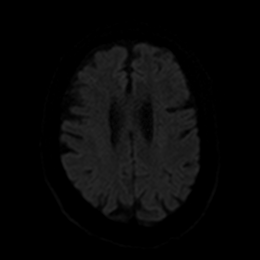
[im 74/96]
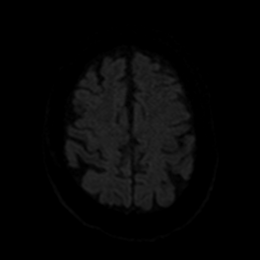
[im 81/96]
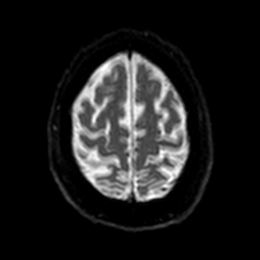
[im 88/96]
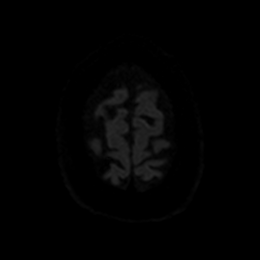
[im 96/96]
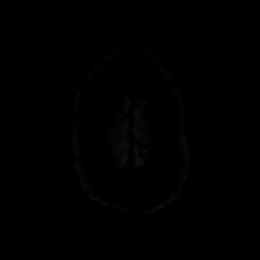

[Series 6: DWI · axial · 3.0mm · 0.88mm/px · z∈[-76,+60]mm · 6 of 48 slices shown (2 of 4)]
[im 1/48]
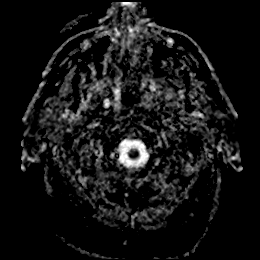
[im 10/48]
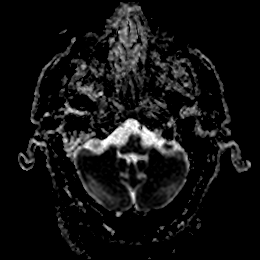
[im 19/48]
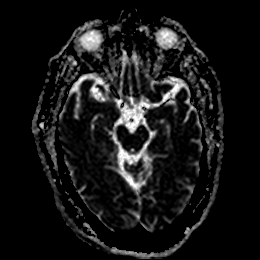
[im 29/48]
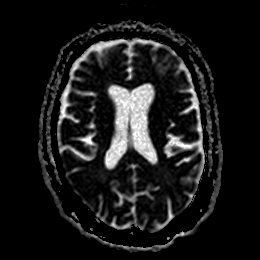
[im 38/48]
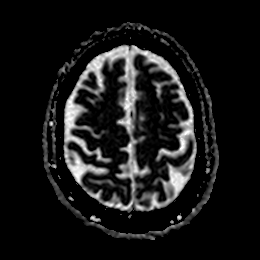
[im 48/48]
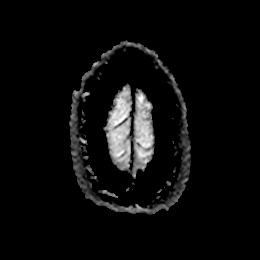

[Series 7: DWI · coronal · 4.0mm · 0.88mm/px · 9 of 72 slices shown (3 of 4)]
[im 1/72]
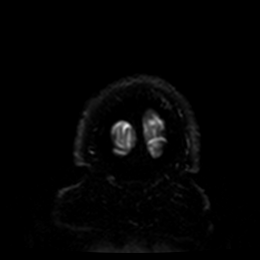
[im 9/72]
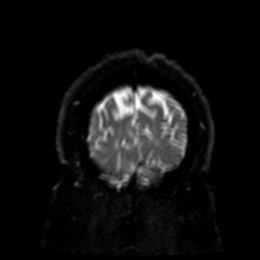
[im 18/72]
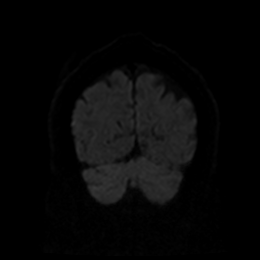
[im 27/72]
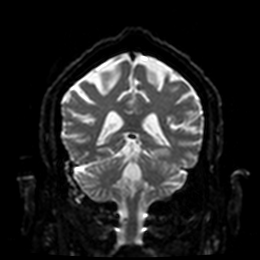
[im 36/72]
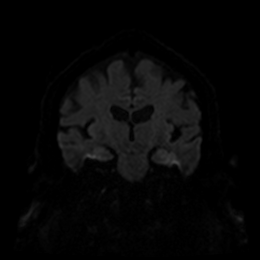
[im 45/72]
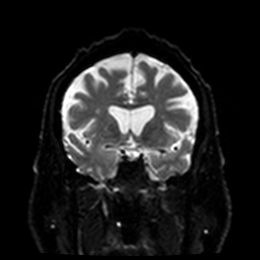
[im 54/72]
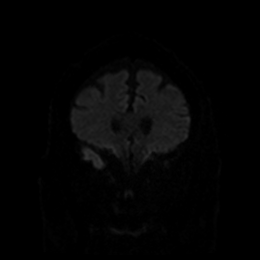
[im 63/72]
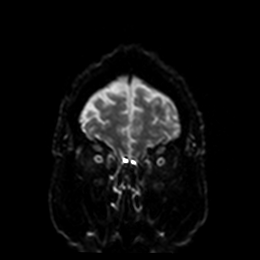
[im 72/72]
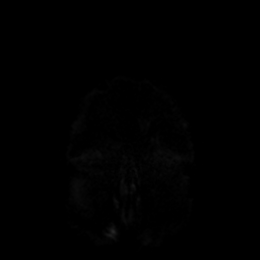

[Series 8: DWI · coronal · 4.0mm · 0.88mm/px · 5 of 36 slices shown (4 of 4)]
[im 1/36]
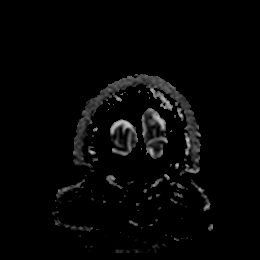
[im 9/36]
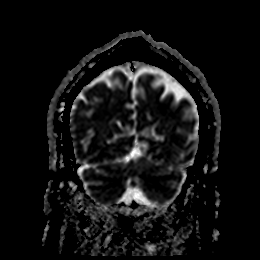
[im 18/36]
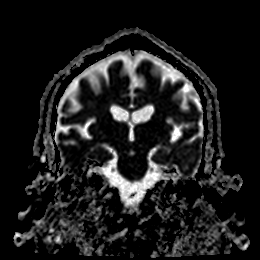
[im 27/36]
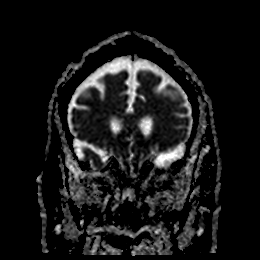
[im 36/36]
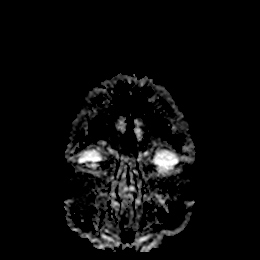

[Series 9: T1 · sagittal · 5.0mm · 0.75mm/px · 3 of 23 slices shown]
[im 1/23]
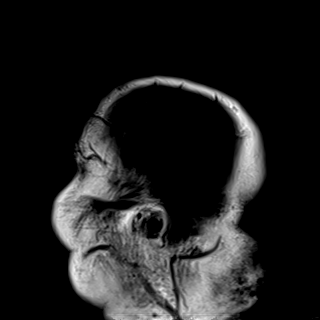
[im 12/23]
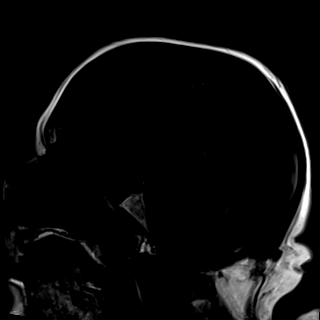
[im 23/23]
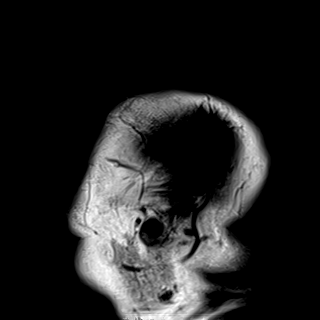

[Series 10: T2 · axial · 5.0mm · 0.72mm/px · z∈[-78,+61]mm · 3 of 25 slices shown]
[im 1/25]
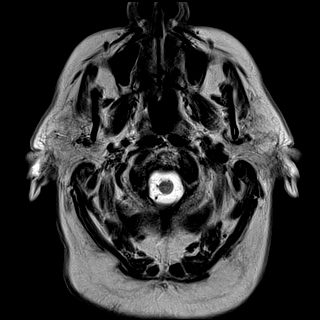
[im 13/25]
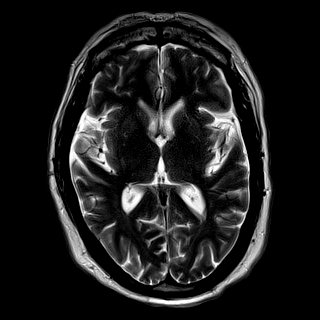
[im 25/25]
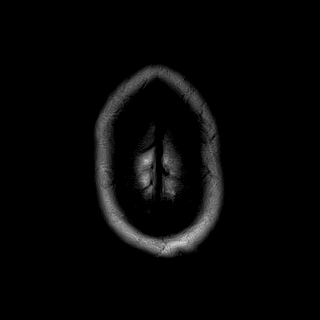

[Series 11: FLAIR · axial · 5.0mm · 0.90mm/px · z∈[-78,+61]mm · 3 of 25 slices shown (1 of 2)]
[im 1/25]
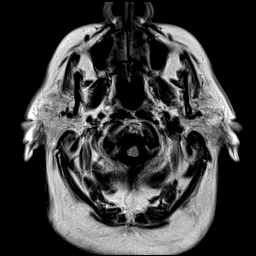
[im 13/25]
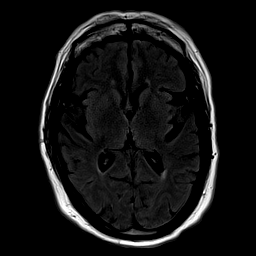
[im 25/25]
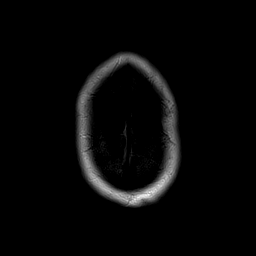

[Series 12: ax hemo · axial · 5.0mm · 0.86mm/px · z∈[-78,+61]mm · 3 of 25 slices shown]
[im 1/25]
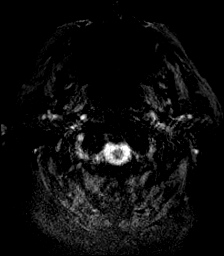
[im 13/25]
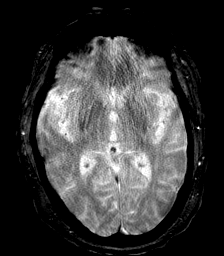
[im 25/25]
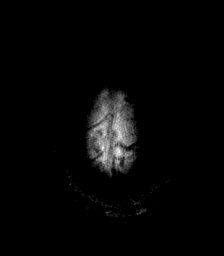

[Series 13: FLAIR · axial · 5.0mm · 0.90mm/px · z∈[-78,+61]mm · 2 of 17 slices shown (2 of 2)]
[im 1/17]
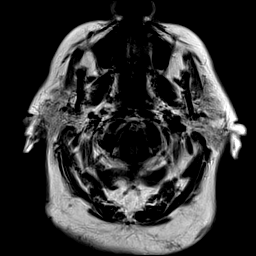
[im 17/17]
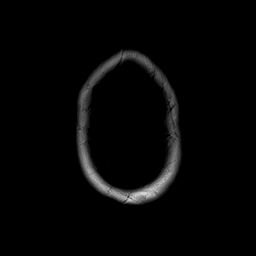

[48 of 48 positions shown; findings below may reference images not displayed]

FINDINGS: Brain:

The examination was prematurely terminated at the patient's request.
Only axial and coronal diffusion-weighted imaging, sagittal T1
weighted imaging, axial T2 weighted imaging, axial T2 FLAIR imaging,
axial T2* imaging and axial T1 FLAIR imaging was obtained.
Postcontrast imaging could not be obtained. The acquired sequences
are motion degraded. Most notably there is moderate motion
degradation of the sagittal T1 weighted sequence and moderate motion
degradation of the axial T2 FLAIR sequence.

Unchanged from prior examination [DATE], there is a 13 x 14 mm
cortically based mass within the anterior left frontal lobe with
associated susceptibility artifact compatible with blood products.
Unchanged small amount of associated edema at this site. There is no
effacement of the ventricular system or midline shift.

No evidence of acute infarct. Background mild scattered T2/FLAIR
hyperintensity within the cerebral white matter is nonspecific, but
consistent with chronic small vessel ischemic disease. Mild
generalized parenchymal atrophy. There is no extra-axial fluid
collection.

Vascular: Flow voids maintained within the proximal large arterial
vessels.

Skull and upper cervical spine: Within the limitations of motion
degradation, no focal marrow lesion is identified.

Sinuses/Orbits: Visualized orbits demonstrate no acute abnormality.
Mild ethmoid sinus mucosal thickening. Redemonstrated right mastoid
effusion.
IMPRESSION: Incomplete and motion degraded examination as described. The
examination was prematurely terminated at the patient's request,
prior to post-contrast imaging.

Unchanged 14 mm cortically based hemorrhagic mass involving the
anterior left frontal lobe with associated mild edema. Findings are
again indeterminate, but most concerning for a metastasis. Primary
CNS neoplasm is also a consideration. Postcontrast imaging is
recommended when the patient is better able to tolerate the study.

Mild generalized parenchymal atrophy and chronic small vessel
ischemic disease.

Right mastoid effusion.

## 2020-02-01 MED ORDER — ORAL CARE MOUTH RINSE
15.0000 mL | Freq: Two times a day (BID) | OROMUCOSAL | Status: DC
  Administered 2020-02-01 – 2020-02-04 (×5): 15 mL via OROMUCOSAL

## 2020-02-01 MED ORDER — FOLIC ACID 1 MG PO TABS
1.0000 mg | ORAL_TABLET | Freq: Every day | ORAL | Status: DC
  Administered 2020-02-01 – 2020-02-10 (×5): 1 mg via ORAL
  Filled 2020-02-01 (×5): qty 1

## 2020-02-01 MED ORDER — DEXAMETHASONE 2 MG PO TABS
4.0000 mg | ORAL_TABLET | Freq: Two times a day (BID) | ORAL | Status: DC
  Administered 2020-02-03 (×2): 4 mg via ORAL
  Filled 2020-02-01 (×2): qty 1

## 2020-02-01 MED ORDER — BENZONATATE 100 MG PO CAPS
200.0000 mg | ORAL_CAPSULE | Freq: Three times a day (TID) | ORAL | Status: DC | PRN
  Administered 2020-02-01 – 2020-02-02 (×2): 200 mg via ORAL
  Filled 2020-02-01 (×2): qty 2

## 2020-02-01 MED ORDER — ARFORMOTEROL TARTRATE 15 MCG/2ML IN NEBU
15.0000 ug | INHALATION_SOLUTION | Freq: Two times a day (BID) | RESPIRATORY_TRACT | Status: DC
  Administered 2020-02-03 – 2020-02-10 (×13): 15 ug via RESPIRATORY_TRACT
  Filled 2020-02-01 (×13): qty 2

## 2020-02-01 MED ORDER — POTASSIUM CHLORIDE 20 MEQ PO PACK
40.0000 meq | PACK | Freq: Once | ORAL | Status: DC
  Filled 2020-02-01: qty 2

## 2020-02-01 MED ORDER — PIPERACILLIN-TAZOBACTAM 3.375 G IVPB
3.3750 g | Freq: Three times a day (TID) | INTRAVENOUS | Status: DC
  Administered 2020-02-01 – 2020-02-10 (×25): 3.375 g via INTRAVENOUS
  Filled 2020-02-01 (×27): qty 50

## 2020-02-01 MED ORDER — LEVETIRACETAM 250 MG PO TABS: 250.0000 mg | ORAL_TABLET | Freq: Two times a day (BID) | ORAL | Status: DC

## 2020-02-01 MED ORDER — LEVETIRACETAM 500 MG PO TABS
500.0000 mg | ORAL_TABLET | Freq: Two times a day (BID) | ORAL | Status: DC
  Administered 2020-02-02 – 2020-02-03 (×4): 500 mg via ORAL
  Filled 2020-02-01 (×4): qty 1

## 2020-02-01 MED ORDER — MAGNESIUM SULFATE IN D5W 1-5 GM/100ML-% IV SOLN
1.0000 g | Freq: Once | INTRAVENOUS | Status: AC
  Administered 2020-02-01: 14:00:00 1 g via INTRAVENOUS
  Filled 2020-02-01: qty 100

## 2020-02-01 MED ORDER — POTASSIUM CITRATE ER 10 MEQ (1080 MG) PO TBCR
40.0000 meq | EXTENDED_RELEASE_TABLET | Freq: Once | ORAL | Status: AC
  Administered 2020-02-01: 40 meq via ORAL
  Filled 2020-02-01: qty 4

## 2020-02-01 MED ORDER — VITAMIN B-12 1000 MCG PO TABS
1000.0000 ug | ORAL_TABLET | Freq: Every day | ORAL | Status: DC
  Administered 2020-02-01 – 2020-02-10 (×5): 1000 ug via ORAL
  Filled 2020-02-01 (×5): qty 1

## 2020-02-01 MED ORDER — DEXAMETHASONE 4 MG PO TABS
8.0000 mg | ORAL_TABLET | Freq: Two times a day (BID) | ORAL | Status: AC
  Administered 2020-02-01 – 2020-02-02 (×4): 8 mg via ORAL
  Filled 2020-02-01 (×4): qty 2

## 2020-02-01 MED ORDER — PIPERACILLIN-TAZOBACTAM 3.375 G IVPB 30 MIN
3.3750 g | Freq: Once | INTRAVENOUS | Status: AC
  Administered 2020-02-01: 12:00:00 3.375 g via INTRAVENOUS
  Filled 2020-02-01: qty 50

## 2020-02-01 NOTE — Progress Notes (Addendum)
NEURO HOSPITALIST PROGRESS NOTE   Subjective: Patient awake, alert, in bed, NAD on 3 L [place]. EEG did not show any seizures or epileptiform discharges.   Exam: Vitals:   02/01/20 0140 02/01/20 0523  BP: 101/70   Pulse: 97   Resp: (!) 26   Temp:  98.9 F (37.2 C)  SpO2: 98%     Physical Exam  Constitutional: Appears well-developed and well-nourished.  Psych: Affect appropriate to situation Eyes: Normal external eye and conjunctiva. HENT: Normocephalic, no lesions, without obvious abnormality.   Musculoskeletal-no joint tenderness, deformity or swelling Cardiovascular: Normal rate and regular rhythm.  Respiratory: Effort normal, non-labored breathing saturations WNL GI: Soft.  No distension. There is no tenderness.  Skin: WDI   Neuro:  Mental Status: Alert, oriented, thought content appropriate.  Speech fluent without evidence of aphasia.  Able to follow  commands without difficulty. Cranial Nerves: II:  Visual fields grossly normal,  III,IV, VI: ptosis not present, extra-ocular motions intact bilaterally pupils equal, round, reactive to light and accommodation V,VII: smile symmetric, facial light touch sensation normal bilaterally VIII: hearing normal bilaterally IX,X: uvula rises symmetrically XI: bilateral shoulder shrug XII: midline tongue extension Motor: Able to life BUE antigravity with 5/5 strength bilaterally. BLE complains of pain and states that he can not move/ lift his legs. Did wiggle toes, but did not even try to lift legs off bed. Tone and bulk:normal tone throughout; no atrophy noted Sensory: light touch intact throughout, bilaterally Deep Tendon Reflexes: 1+ and symmetric biceps, patella Plantars: Right: downgoing   Left: downgoing Cerebellar: No ataxia Gait: deferred    Medications:  Scheduled: . budesonide (PULMICORT) nebulizer solution  0.25 mg Nebulization BID  . Chlorhexidine Gluconate Cloth  6 each Topical Daily  .  dexamethasone  8 mg Oral Q12H   Followed by  . [START ON 02/03/2020] dexamethasone  4 mg Oral Q12H  . folic acid  1 mg Oral Daily  . levETIRAcetam  500 mg Oral BID  . mouth rinse  15 mL Mouth Rinse BID  . potassium chloride  40 mEq Oral Once  . QUEtiapine  25 mg Oral QHS  . sodium chloride flush  10-40 mL Intracatheter Q12H  . vitamin B-12  1,000 mcg Oral Daily   Continuous: . sodium chloride 75 mL/hr at 02/01/20 1107  . azithromycin 500 mg (01/29/2020 2000)  . piperacillin-tazobactam 3.375 g (02/01/20 1136)  . piperacillin-tazobactam (ZOSYN)  IV     TJQ:[id number] **OR** acetaminophen, benzonatate, ipratropium-albuterol, ondansetron **OR** ondansetron (ZOFRAN) IV, sodium chloride flush  Pertinent Labs/Diagnostics:   EEG  Result Date: 02/01/2020 Lora Havens, MD     02/01/2020 10:08 AM Patient Name: Shane Avila MRN: [id number] Epilepsy Attending: Lora Havens Referring Physician/Provider: Dr Amie [place] Date: 02/01/2020 Duration: 22.41 mins Patient history: 69yo M with ams. MRI brain showed possible hemorrhagic metastatic lesion in the left frontal lobe which is cortically based. EEG to evaluate for seizure. Level of alertness: awake AEDs during EEG study: keppra Technical aspects: This EEG study was done with scalp electrodes positioned according to the 10-20 International system of electrode placement. Electrical activity was acquired at a sampling rate of 500Hz  and reviewed with a high frequency filter of 70Hz  and a low frequency filter of 1Hz . EEG data were recorded continuously and digitally stored. DESCRIPTION: The posterior dominant rhythm consists of 8-9Hz  activity of moderate voltage (25-35 uV) seen  predominantly in posterior head regions, symmetric and reactive to eye opening and eye closing.  Photic driving was seen in photic stimulation. Hyperventilation was not performed. IMPRESSION: This study is within normal limits. No seizures or epileptiform discharges were seen  throughout the recording. However, only wakefulness was recorded. If suspicion for interictal activity remains a concern, a prolonged study including sleep should be considered. Lora Havens   MR BRAIN WO CONTRAST  Result Date: 02/02/2020 CLINICAL DATA:  Follow-up examination for subarachnoid hemorrhage. History of right lung mass, smoking. EXAM: MRI HEAD WITHOUT CONTRAST TECHNIQUE: Multiplanar, multiecho pulse sequences of the brain and surrounding structures were obtained without intravenous contrast. COMPARISON:  Prior head CT from 01/30/2020. FINDINGS: Brain: Examination moderately degraded by motion artifact. Diffuse prominence of the CSF containing spaces compatible with generalized age-related cerebral atrophy. Patchy T2/FLAIR hyperintensity within the periventricular and deep white matter both cerebral hemispheres most consistent with chronic small vessel ischemic disease, mild to moderate in nature. No abnormal foci of restricted diffusion to suggest acute or subacute ischemia. No encephalomalacia to suggest chronic cortical infarction. There is an approximate 13 x 14 mm cortically based mass positioned at the anterior left frontal lobe, corresponding with abnormality seen on prior CT (series 15, image 17 associated susceptibility artifact compatible with blood products, also seen on prior CT. No other definite discrete masses identified on this noncontrast examination. No midline shift. No hydrocephalus. No extra-axial fluid collection. Pituitary gland suprasellar region normal. Midline structures intact.). Exact measurements somewhat difficult given lack of IV contrast and motion artifact. Small amount of localized vasogenic edema without significant regional mass effect. Vascular: Major intracranial vascular flow voids are maintained. Skull and upper cervical spine: Craniocervical junction within normal limits. Visualized upper cervical spine grossly unremarkable. Bone marrow signal intensity  within normal limits. No focal marrow replacing lesion. Scalp soft tissues unremarkable. Sinuses/Orbits: Patient status post ocular lens replacement on the left. Globes and orbital soft tissues demonstrate no acute finding. Paranasal sinuses are clear. Other: Right mastoid and middle ear effusion noted, of uncertain significance. Visualized nasopharynx grossly unremarkable. IMPRESSION: 1. Approximate 14 mm cortically based hemorrhagic mass involving the anterior left frontal lobe, corresponding with abnormality seen on prior CT. Mild localized vasogenic edema without significant regional mass effect. Finding is indeterminate, but most concerning for a possible solitary intracranial metastasis given the patient's pulmonary history. Primary CNS neoplasm could also be considered. Further assessment with postcontrast imaging recommended for complete evaluation. 2. Underlying mild age-related cerebral atrophy with mild-to-moderate chronic microvascular ischemic disease. Electronically Signed   By: Jeannine Boga M.D.   On: 02/01/2020 19:36   US RENAL  Result Date: 02/11/2020 CLINICAL DATA:  Acute renal insufficiency, renal cysts, renal calculi EXAM: RENAL / URINARY TRACT ULTRASOUND COMPLETE COMPARISON:  01/30/2020 FINDINGS: Right Kidney: Renal measurements: 17.1 x 10.1 x 7.7 cm = volume: 696 mL. Numerous right renal cysts are identified measuring up to 8.9 cm, unchanged since recent CT. There is increased renal cortical echotexture. Left Kidney: Renal measurements: 16.9 x 8.4 by 7.1 cm = volume: 524 mL. Multiple left renal cysts are identified largest measuring 6.5 cm. Mild increased renal cortical echotexture. Bladder: The bladder is decompressed. Other: None. IMPRESSION: 1. Numerous bilateral renal cortical cysts. 2. Increased renal cortical echotexture consistent with medical renal disease. Electronically Signed   By: Randa Ngo M.D.   On: 02/15/2020 23:27   DG CHEST PORT 1 VIEW  Result Date:  01/22/2020 CLINICAL DATA:  Dyspnea EXAM: PORTABLE CHEST 1 VIEW COMPARISON:  01/20/2020 8:38 a.m, chest CT 01/26/2020. FINDINGS: Single frontal view of the chest demonstrates stable right internal jugular catheter tip overlying superior vena cava. The cardiac silhouette is enlarged but stable. There is persistent but improving right basilar airspace disease. No effusion or pneumothorax. IMPRESSION: 1. Persistent but improving right basilar airspace disease, most compatible with postobstructive bronchopneumonia. Please refer to prior chest CT 01/23/2020 describing a central obstructive right hilar mass. Electronically Signed   By: Randa Ngo M.D.   On: 01/27/2020 17:59   Assessment:  69 year old male past history of atrial fibrillation, lung mass, chronic history of smoking, insomnia with chronic pain medication and sleep medication use, presenting for evaluation of confusion.suspicious finding for a possible subarachnoid versus mass in the left frontal lobe was seen at OSH. Here  MRI reveals a possible hemorrhagic metastatic lesion in the left frontal lobe which is cortically based. Mri brain with and WO contrast ordered to further evaluate lesion. GFR today is 50. Most likely his confusion is secondary to this frontal lesion. Rule out seizure given the cortical location.   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  low dose seroquel can be used for sleep -hypotension.  per primary team. --MRI brain  with and without contrast at that time to get a clear picture of this lesion.  JLaurey Morale MSN, NP-C [facility] [phone] Attending neurologist's note to follow    02/01/2020, 10:55 AM   NEUROHOSPITALIST ADDENDUM Performed a face to face diagnostic evaluation.   I have reviewed the contents of history and physical exam as documented by PA/ARNP/Resident and agree with above documentation.  I have discussed and formulated the above plan as documented. Edits to the note have been made as needed.  Reviewed imaging, is concerning for hemorrhagic met rather than contusion.  Unfortunately patient refused MRI brain.  Given hilar mass is highly concerning for lung cancer, will go ahead and consult oncology.  The patient is here to Monday - please consult Dr. VMickeal Skinner neuro oncologist.  His lung cancer confirmed, may not need biopsy so we will hold off consulting neurosurgery for now.  Patient has been started on Keppra 500 mg twice daily. Continue dose.  EEG shows no epileptiform discharges.    SKarena AddisonAroor MD [facility] [phone]  If 7pm to 7am, please call on call as listed on AMION.

## 2020-02-01 NOTE — Consult Note (Signed)
NAME:  Shane Avila, MRN:  [id number], DOB:  [birth year], LOS: 1 ADMISSION DATE:  02/13/2020, CONSULTATION DATE: 02/01/2020 REFERRING MD: Dr. Tyrell Antonio, CHIEF COMPLAINT: Subcarinal mass  Brief History   69 year old smoker with COPD, hypoxemic failure,  History of present illness   69 year old former smoker (110 pack years) with a history of COPD and chronic hypoxemic respiratory failure on 2 L/min.  Also with hypertension, hyperlipidemia, atrial fibrillation and depression.  He apparently is undergoing work-up for a subcarinal, right hilar mass that was first discovered on CT chest 11/13/2019 at [place].  Biopsy was planned but has not yet been done.  He was evaluated in [facility] ED at [facility] 2/11 for confusion and "bizarre behavior".  CT head revealed a left frontal small hyperdense area, question SAH versus mass.  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  His other work-up revealed acute renal failure (improving).  Past Medical History  COPD Chronic hypoxemic respiratory failure Hypertension Hyperlipidemia Atrial fibrillation Depression COVID-19 pneumonia in October 2020, did not require hospital admission  Significant Hospital Events     Consults:  Neurology PCCM  Procedures:    Significant Diagnostic Tests:   CT chest Oval [place]) 01/30/2020 >> 5.1 x 4.8 cm subcarinal mass that impacts the main carina and extrinsically compresses the right mainstem bronchus, right upper lobe airway and bronchus intermedius.  Unclear whether there is an endobronchial lesion.  Significant right lower lobe volume loss/atelectasis/collapse vs post-obstructive PNA.  Small right effusion   Micro Data:  [id number] 2/12 >> negative Blood 2/12 >>  Respiratory 2/12 >>  Antimicrobials:  Ceftriaxone 2/12 x1 Azithromycin 2/12 >>  Zosyn 2/13 >>   Interim history/subjective:  Patient is calm.   States that he does not know about the subcarinal mass, notes that there were plans for it to be biopsied with what sounds like bronchoscopy by Dr Nani Gasser in [place].  Per family > his was never done - had to be cancelled due to A Fib, and then he got confused and missed his appt.    Objective   Blood pressure 101/70, pulse (!) 57, temperature 98.9 F (37.2 C), temperature source Axillary, resp. rate (!) 22, weight (!) 142.6 kg, SpO2 (!) 85 %.        Intake/Output Summary (Last 24 hours) at 02/01/2020 1430 Last data filed at 02/01/2020 5916 Gross per 24 hour  Intake 1360 ml  Output 2500 ml  Net -1140 ml   Filed Weights   02/01/20 0500  Weight: (!) 142.6 kg    Examination: General: Obese gentleman, laying in bed in no distress HENT: Oropharynx dry, some dried mucus/exudate in his posterior pharynx, poor dentition Lungs: Coarse bilaterally, slight paradoxical abdominal movement, no wheezing Cardiovascular: Regular, distant, 90s Abdomen: Obese, soft, nondistended with positive bowel sounds Extremities: No significant edema Neuro: Awake, alert, answers questions and is oriented x3 (thought it was 2/14 or 15).  He lacks insight, believes that he is going home this evening even though that plan does not appear to be officially in place.  Moves all extremities.  No tremor GU: Deferred  Resolved Hospital Problem list     Assessment & Plan:   Subcarinal/right hilar mass, almost certainly primary lung cancer, 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 -Most straightforward biopsy strategy would be bronchoscopy with available endobronchial ultrasound in the event that there is no endobronchial lesion.  This would likely be  done most safely under general anesthesia.  Could consider standard FOB with conscious sedation and then proceeding to intubation and EBUS if no endobronchial lesion present.  I would prefer to do it with continuous anesthesia monitoring. -Patient  states that at this time he does not want to get the lesion evaluated here, wants to go home, thinks that this is his plan for this evening.  Not clear to me that that is true.  He may be in some degree of denial as he was scheduled to have a biopsy done previously but this never happened.  He may just be confused, have poor insight. I discussed with his wife and daughter by phone 2/13.  They agree that he should have the procedure done here while he is admitted.  We may need to get their help in convincing him that that is the appropriate plan.  If he is willing to have bronchoscopy done here then I would proceed as above  Suspected postobstructive pneumonia -Agree with azithromycin and Zosyn pending respiratory culture data  COPD -Can hold his Pulmicort while he is on dexamethasone -Start scheduled Brovana twice daily -Consider adding Spiriva Respimat going forward -Okay to continue DuoNebs available as needed  Encephalopathy, confusion.  Suspect largely due to his left frontal lobe lesion.  Consider also contribution of metabolic status, his renal insufficiency.  He is still somewhat confused, lacks insight into his current medical condition. -Appreciate neurology evaluation -EEG 2/13 reassuring -Seizure precautions -Keppra 500 mg twice daily as ordered -Decadron as ordered  Acute renal failure, improving Follow BMP, urine output with volume resuscitation   Labs   CBC: Recent Labs  Lab 02/02/2020 1727 02/01/20 0518  WBC 13.2* 11.4*  HGB 10.8* 10.1*  HCT 35.0* 33.4*  MCV 84.1 85.0  PLT 278 810    Basic Metabolic Panel: Recent Labs  Lab 02/13/2020 1727 02/01/20 0518 02/01/20 1103  NA 135 137  --   K 3.9 3.6  --   CL 99 100  --   CO2 23 25  --   GLUCOSE 95 86  --   BUN 52* 42*  --   CREATININE 2.06* 1.45*  --   CALCIUM 9.4 9.1  --   MG  --   --  1.9   GFR: CrCl cannot be calculated (Unknown ideal weight.). Recent Labs  Lab 01/22/2020 1727 02/01/20 0518  WBC 13.2*  11.4*    Liver Function Tests: Recent Labs  Lab 02/03/2020 1727 02/01/20 0518  AST 23 21  ALT 20 20  ALKPHOS 108 111  BILITOT 1.0 1.0  PROT 6.9 6.2*  ALBUMIN 2.2* 2.1*   No results for input(s): LIPASE, AMYLASE in the last 168 hours. No results for input(s): AMMONIA in the last 168 hours.  ABG No results found for: PHART, PCO2ART, PO2ART, HCO3, TCO2, ACIDBASEDEF, O2SAT   Coagulation Profile: Recent Labs  Lab 02/01/20 0518  INR 1.3*    Cardiac Enzymes: No results for input(s): CKTOTAL, CKMB, CKMBINDEX, TROPONINI in the last 168 hours.  HbA1C: No results found for: HGBA1C  CBG: No results for input(s): GLUCAP in the last 168 hours.  Review of Systems:   Feels well, denies pain, denies cough  Social History      Family History   His family history is not on file.   Allergies Allergies  Allergen Reactions  . Codeine     nausea     Home Medications  Prior to Admission medications   Not on File  Critical care time: NA      Baltazar Apo, MD, PhD 02/01/2020, 2:59 PM [facility] [phone] or if no answer [phone]

## 2020-02-01 NOTE — Plan of Care (Signed)

## 2020-02-01 NOTE — Progress Notes (Signed)
EEG complete - results pending 

## 2020-02-01 NOTE — Evaluation (Signed)
Physical Therapy Evaluation Patient Details Name: Shane Avila MRN: [id number] DOB: [birth year] Today's Date: 02/01/2020   History of Present Illness  Patient presented to the hospital with AKI, dyspnea, AMS. He previously had found a mass on his lung but failed to show up for his biopsy. 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 PMH: A-fib , chronic O2 use, depression, HTN, and hyperlipidemia  Clinical Impression  Patient required max a to sit at the edge of the bed. He was confused but followed commands. He was unable to stand. He was short off breath sitting edge of the bed. He would benefit from rehab at a SNF at this time. Acute therapy will continue to work with him on mobility as appropriate.     Follow Up Recommendations SNF    Equipment Recommendations  Rolling walker with 5" wheels    Recommendations for Other Services Rehab consult     Precautions / Restrictions Precautions Precautions: Fall Restrictions Weight Bearing Restrictions: No      Mobility  Bed Mobility Overal bed mobility: Needs Assistance Bed Mobility: Supine to Sit;Sit to Supine     Supine to sit: Max assist;+2 for safety/equipment;+2 for physical assistance Sit to supine: Max assist;+2 for physical assistance;+2 for safety/equipment   General bed mobility comments: Max a to sit up and to scoot his hips. Max   Transfers                 General transfer comment: Patient unable to stand. he became SOB sitting at the edge of the bed and was transfered back to a supine head elevated position   Ambulation/Gait                Stairs            Wheelchair Mobility    Modified Rankin (Stroke Patients Only)       Balance Overall balance assessment: Needs assistance         Standing balance support: Single extremity supported Standing balance-Leahy Scale: Fair Standing balance comment: needed min guard sitting edge of th bed                               Pertinent Vitals/Pain Pain Assessment: Faces Faces Pain Scale: Hurts whole lot Pain Location: bilateral LE R>L  Pain Descriptors / Indicators: Grimacing;Guarding;Moaning Pain Intervention(s): Limited activity within patient's tolerance    Home Living Family/patient expects to be discharged to:: Private residence Living Arrangements: Spouse/significant other Available Help at Discharge: Family Type of Home: House Home Access: Level entry     Home Layout: One level   Additional Comments: Patient answered all questions but was an unreliable historian.     Prior Function Level of Independence: Independent with assistive device(s)         Comments: used a walker because of his arthritis      Hand Dominance        Extremity/Trunk Assessment   Upper Extremity Assessment Upper Extremity Assessment: Defer to OT evaluation    Lower Extremity Assessment Lower Extremity Assessment: Generalized weakness       Communication   Communication: No difficulties  Cognition Arousal/Alertness: Awake/alert Behavior During Therapy: Restless;Agitated;Impulsive Overall Cognitive Status: Within Functional Limits for tasks assessed  General Comments: Patient could answer all questions but became agitated at the questions at one point. He was easily redirected though.       General Comments      Exercises     Assessment/Plan    PT Assessment Patient needs continued PT services  PT Problem List Decreased strength;Decreased range of motion;Decreased activity tolerance;Decreased balance;Decreased mobility;Decreased cognition;Decreased knowledge of use of DME;Decreased safety awareness;Decreased knowledge of precautions;Pain       PT Treatment Interventions DME instruction;Gait training;Functional mobility training;Therapeutic activities;Therapeutic exercise;Neuromuscular re-education;Patient/family  education    PT Goals (Current goals can be found in the Care Plan section)  Acute Rehab PT Goals Patient Stated Goal: unable to state  PT Goal Formulation: With patient Time For Goal Achievement: 02/08/20 Potential to Achieve Goals: Fair    Frequency Min 2X/week   Barriers to discharge        Co-evaluation PT/OT/SLP Co-Evaluation/Treatment: Yes Reason for Co-Treatment: Complexity of the patient's impairments (multi-system involvement);Necessary to address cognition/behavior during functional activity;For patient/therapist safety;To address functional/ADL transfers PT goals addressed during session: Balance;Mobility/safety with mobility;Proper use of DME;Strengthening/ROM         AM-PAC PT "6 Clicks" Mobility  Outcome Measure Help needed turning from your back to your side while in a flat bed without using bedrails?: Total Help needed moving from lying on your back to sitting on the side of a flat bed without using bedrails?: Total Help needed moving to and from a bed to a chair (including a wheelchair)?: Total Help needed standing up from a chair using your arms (e.g., wheelchair or bedside chair)?: Total Help needed to walk in hospital room?: Total Help needed climbing 3-5 steps with a railing? : Total 6 Click Score: 6    End of Session   Activity Tolerance: Patient limited by fatigue;Patient limited by pain;Treatment limited secondary to agitation Patient left: in bed;with call bell/phone within reach;with bed alarm set Nurse Communication: Mobility status PT Visit Diagnosis: Unsteadiness on feet (R26.81);Other abnormalities of gait and mobility (R26.89);Difficulty in walking, not elsewhere classified (R26.2);Pain Pain - Right/Left: (bilateral) Pain - part of body: Leg    Time: 1130-1150 PT Time Calculation (min) (ACUTE ONLY): 20 min   Charges:   PT Evaluation $PT Eval High Complexity: 1 High            Carney Living PT DPT  02/01/2020, 1:06 PM

## 2020-02-01 NOTE — Progress Notes (Addendum)
Pharmacy Antibiotic Note  Shane Avila is a 69 y.o. 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 She is on rocephin and azithromycin for PNA and antibiotics to change to Zosyn. Pharmacy consulted to dose -WBC= 11.4, afebrile, SCr= 1.45, CrCL ~ 50  Plan -Zosyn 3,375gm IV q8h -Will follow renal function, cultures and clinical progress    Weight: (!) 314 lb 6 oz (142.6 kg)  Temp (24hrs), Avg:98.5 F (36.9 C), Min:98.2 F (36.8 C), Max:98.9 F (37.2 C)  Recent Labs  Lab 02/10/2020 1727 02/01/20 0518  WBC 13.2* 11.4*  CREATININE 2.06* 1.45*    CrCl cannot be calculated (Unknown ideal weight.).    Allergies  Allergen Reactions  . Codeine     nausea    Antimicrobials this admission: 2/12 rocephin x1 2/12 azithromycin x1 2/13 zosyn  Dose adjustments this admission:   Microbiology results: 2/12 blood  Thank you for allowing pharmacy to be a part of this patient's care.  Hildred Laser, PharmD Clinical Pharmacist **Pharmacist phone directory can now be found on [email or web address] ([email or web address]).  Listed under [place].

## 2020-02-01 NOTE — Procedures (Signed)
Patient Name: Shane Avila  MRN: [id number]  Epilepsy Attending: Lora Havens  Referring Physician/Provider: Dr Amie Portland Date: 02/01/2020 Duration: 22.41 mins  Patient history: 69yo M with ams. MRI brain showed possible hemorrhagic metastatic lesion in the left frontal lobe which is cortically based. EEG to evaluate for seizure.  Level of alertness: awake  AEDs during EEG study: keppra  Technical aspects: This EEG study was done with scalp electrodes positioned according to the 10-20 International system of electrode placement. Electrical activity was acquired at a sampling rate of 500Hz  and reviewed with a high frequency filter of 70Hz  and a low frequency filter of 1Hz . EEG data were recorded continuously and digitally stored.   DESCRIPTION: The posterior dominant rhythm consists of 8-9Hz  activity of moderate voltage (25-35 uV) seen predominantly in posterior head regions, symmetric and reactive to eye opening and eye closing.  Photic driving was seen in photic stimulation. Hyperventilation was not performed.  IMPRESSION: This study is within normal limits. No seizures or epileptiform discharges were seen throughout the recording. However, only wakefulness was recorded. If suspicion for interictal activity remains a concern, a prolonged study including sleep should be considered.   Zarria Towell Barbra Sarks

## 2020-02-01 NOTE — Progress Notes (Signed)
PROGRESS NOTE    Shane Avila  ZOX:[id number] DOB: 04-May-1951 DOA: 02/15/2020 PCP: Bonnita Nasuti, MD    Brief Narrative:  69 year old Caucasian male with PMH of atrial fibrillation, tobacco abuse, right lung mass initially seen on imaging on 12/03/2019 initially presented to [facility] for altered mental status and insomnia ongoing for about 3 weeks, found to have small area of hemorrhage on CT head.  Right lung mass was redemonstrated on CT chest.  Was also noted to have changes consistent with postobstructive pneumonia on chest imaging.  He was started on antibiotics and transferred to [facility] for further evaluation with MRI and specialty care.  Per documentation, he had a cardiac arrest during initial attempt to have a biopsy and was subsequently scheduled to have the biopsy done on the day of presentation to the hospital.  Seen by neurology who recommended Keppra and MRI with contrast when renal function allows.   Assessment & Plan:   Active Problems:   SAH (subarachnoid hemorrhage) (HCC)   AKI (acute kidney injury) ([facility])   Lung mass   Lobar pneumonia (HCC)   Brain mass   Acute encephalopathy  Acute encephalopathy Likely multifactorial in setting of infection, brain lesion Management as below Vitamin W11, folic acid low.  Replace orally Seroquel 25 mg at bedtime ordered per neurology for insomnia Tramadol discontinued as it decreases seizure threshold  Brain mass MRI brain without contrast showed 1.4 cm cortically based hemorrhagic mass involving the left anterior frontal lobe with mild localized vasogenic edema.  Concern for solitary metastasis versus primary CNS neoplasm. Neurology on board-continue Keppra EEG showed no seizure or epileptiform discharges MRI with contrast ordered per neurology recommendation for better delineation of the mass.  Pending results, will consult neurosurgery for further recommendations. Spoke with Dr. Learta Codding with oncology who  recommended low-dose dexamethasone 8 mg twice daily orally for 2 days followed by 4 mg twice daily.  Right lung mass CT chest without contrast on 02/14/2020 showed 'redemonstration of a large subcarinal mass difficult to fully characterize and absence of contrast but appears to be progressively narrowing the carina and proximal left mainstem bronchus as well is encasing and completely occluding the right mainstem bronchus and bronchus intermedius.  Progressive multifocal areas of airspace opacity throughout the right lung worrisome for postobstructive pneumonia.' Pulmonology called for bronchoscopy.  He will be seen sometime next week for this.  Postobstructive pneumonia CT chest on 02/10/2020 showed multifocal opacities concerning for postobstructive pneumonia Switch antibiotics from ceftriaxone to Zosyn to cover anaerobes/Pseudomonas, continue azithromycin  Acute renal failure Creatinine 2.06 on admission.  Unclear baseline, renal ultrasound consistent with medical renal disease. Likely prerenal-in setting of poor p.o. intake, diuretics Continue IV fluids Avoid nephrotoxic drugs, renally dose all medications Daily BMP  Acute hypoxic respiratory failure Requiring 3 L oxygen via nasal cannula Multifactorial in setting of lung mass and pneumonia  Hypotension Likely in setting of poor p.o. intake, volume depletion, infection, continued antihypertensive use Improved with fluid resuscitation  Atrial fibrillation Per documentation on nebivolol at home.  On hold due to borderline low blood pressure  Medication reconciliation in epic pending  DVT prophylaxis: SCD/Compression stockings (avoiding chemical prophylaxis in setting of CNS hemorrhage) Code Status: Full code  Family Communication: Updated daughter Jackelyn Poling and wife via telephone Disposition Plan: Pending medical stability, admitted from home   Consultants:   Neurology, pulmonology called for  bronchoscopy  Procedures:  None  Antimicrobials:  Anti-infectives (From admission, onward)   Start     Dose/Rate  Route Frequency Ordered Stop   02/01/20 1800  piperacillin-tazobactam (ZOSYN) IVPB 3.375 g     3.375 g 12.5 mL/hr over 240 Minutes Intravenous Every 8 hours 02/01/20 1048     02/01/20 1130  piperacillin-tazobactam (ZOSYN) IVPB 3.375 g     3.375 g 100 mL/hr over 30 Minutes Intravenous  Once 02/01/20 1048 02/01/20 1206   01/21/2020 1730  cefTRIAXone (ROCEPHIN) 1 g in sodium chloride 0.9 % 100 mL IVPB  Status:  Discontinued     1 g 200 mL/hr over 30 Minutes Intravenous Every 24 hours 02/10/2020 1721 02/01/20 1010   02/07/2020 1730  azithromycin (ZITHROMAX) 500 mg in sodium chloride 0.9 % 250 mL IVPB     500 mg 250 mL/hr over 60 Minutes Intravenous Every 24 hours 01/29/2020 1721           Subjective: Awake, alert but confused.  Objective: Vitals:   02/01/20 0110 02/01/20 0140 02/01/20 0500 02/01/20 0523  BP: (!) 90/54 101/70    Pulse:  97    Resp: 20 (!) 26    Temp:    98.9 F (37.2 C)  TempSrc:    Axillary  SpO2:  98%    Weight:   (!) 142.6 kg     Intake/Output Summary (Last 24 hours) at 02/01/2020 9211 Last data filed at 02/01/2020 0400 Gross per 24 hour  Intake 1360 ml  Output 1700 ml  Net -340 ml   Filed Weights   02/01/20 0500  Weight: (!) 142.6 kg    Examination:  General exam: Awake, alert, mildly agitated Respiratory system: Bilateral rhonchi present, mild accessory muscle use on 3 L oxygen via nasal cannula Cardiovascular system: S1 & S2 heard, RRR. No JVD, murmurs.  Puffy feet but no pitting pedal edema Gastrointestinal system: Abdomen is nondistended, soft and nontender.  Normal bowel sounds heard. Central nervous system: Alert, disoriented.  Pupils equal and reactive to light.  Uses both upper extremities voluntarily Extremities: No significant edema Skin: No rashes, lesions or ulcers Psychiatry: Confused Right IJ in place, Foley catheter in  place  Data Reviewed: I have personally reviewed following labs and imaging studies  CBC: Recent Labs  Lab 01/30/2020 1727 02/01/20 0518  WBC 13.2* 11.4*  HGB 10.8* 10.1*  HCT 35.0* 33.4*  MCV 84.1 85.0  PLT 278 941   Basic Metabolic Panel: Recent Labs  Lab 01/21/2020 1727 02/01/20 0518  NA 135 137  K 3.9 3.6  CL 99 100  CO2 23 25  GLUCOSE 95 86  BUN 52* 42*  CREATININE 2.06* 1.45*  CALCIUM 9.4 9.1   GFR: CrCl cannot be calculated (Unknown ideal weight.). Liver Function Tests: Recent Labs  Lab 01/25/2020 1727 02/01/20 0518  AST 23 21  ALT 20 20  ALKPHOS 108 111  BILITOT 1.0 1.0  PROT 6.9 6.2*  ALBUMIN 2.2* 2.1*   No results for input(s): LIPASE, AMYLASE in the last 168 hours. No results for input(s): AMMONIA in the last 168 hours. Coagulation Profile: Recent Labs  Lab 02/01/20 0518  INR 1.3*   Cardiac Enzymes: No results for input(s): CKTOTAL, CKMB, CKMBINDEX, TROPONINI in the last 168 hours. BNP (last 3 results) No results for input(s): PROBNP in the last 8760 hours. HbA1C: No results for input(s): HGBA1C in the last 72 hours. CBG: No results for input(s): GLUCAP in the last 168 hours. Lipid Profile: No results for input(s): CHOL, HDL, LDLCALC, TRIG, CHOLHDL, LDLDIRECT in the last 72 hours. Thyroid Function Tests: No results for input(s): TSH, T4TOTAL,  FREET4, T3FREE, THYROIDAB in the last 72 hours. Anemia Panel: Recent Labs    02/01/20 0518 02/01/20 0519  VITAMINB12 227  --   FOLATE  --  2.9*  FERRITIN 212  --   TIBC 181*  --   IRON 21*  --   RETICCTPCT  --  1.0   Sepsis Labs: No results for input(s): PROCALCITON, LATICACIDVEN in the last 168 hours.  Recent Results (from the past 240 hour(s))  SARS CORONAVIRUS 2 (TAT 6-24 HRS) Nasopharyngeal Nasopharyngeal Swab     Status: None   Collection Time: 02/06/2020  6:12 PM   Specimen: Nasopharyngeal Swab  Result Value Ref Range Status   SARS Coronavirus 2 NEGATIVE NEGATIVE Final    Comment:  (NOTE) SARS-CoV-2 target nucleic acids are NOT DETECTED. The SARS-CoV-2 RNA is generally detectable in upper and lower respiratory specimens during the acute phase of infection. Negative results do not preclude SARS-CoV-2 infection, do not rule out co-infections with other pathogens, and should not be used as the sole basis for treatment or other patient management decisions. Negative results must be combined with clinical observations, patient history, and epidemiological information. The expected result is Negative. Fact Sheet for Patients: SugarRoll.be Fact Sheet for Healthcare Providers: https://www.woods-mathews.com/ This test is not yet approved or cleared by the [place] FDA and  has been authorized for detection and/or diagnosis of SARS-CoV-2 by FDA under an Emergency Use Authorization (EUA). This EUA will remain  in effect (meaning this test can be used) for the duration of the COVID-19 declaration under Section 56 4(b)(1) of the Act, 21 U.S.C. section 360bbb-3(b)(1), unless the authorization is terminated or revoked sooner. Performed at [facility], [place] [address], [place], [place] [address]          Radiology Studies: MR BRAIN WO CONTRAST  Result Date: 02/12/2020 CLINICAL DATA:  Follow-up examination for subarachnoid hemorrhage. History of right lung mass, smoking. EXAM: MRI HEAD WITHOUT CONTRAST TECHNIQUE: Multiplanar, multiecho pulse sequences of the brain and surrounding structures were obtained without intravenous contrast. COMPARISON:  Prior head CT from 01/30/2020. FINDINGS: Brain: Examination moderately degraded by motion artifact. Diffuse prominence of the CSF containing spaces compatible with generalized age-related cerebral atrophy. Patchy T2/FLAIR hyperintensity within the periventricular and deep white matter both cerebral hemispheres most consistent with chronic small vessel ischemic disease, mild to moderate  in nature. No abnormal foci of restricted diffusion to suggest acute or subacute ischemia. No encephalomalacia to suggest chronic cortical infarction. There is an approximate 13 x 14 mm cortically based mass positioned at the anterior left frontal lobe, corresponding with abnormality seen on prior CT (series 15, image 17 associated susceptibility artifact compatible with blood products, also seen on prior CT. No other definite discrete masses identified on this noncontrast examination. No midline shift. No hydrocephalus. No extra-axial fluid collection. Pituitary gland suprasellar region normal. Midline structures intact.). Exact measurements somewhat difficult given lack of IV contrast and motion artifact. Small amount of localized vasogenic edema without significant regional mass effect. Vascular: Major intracranial vascular flow voids are maintained. Skull and upper cervical spine: Craniocervical junction within normal limits. Visualized upper cervical spine grossly unremarkable. Bone marrow signal intensity within normal limits. No focal marrow replacing lesion. Scalp soft tissues unremarkable. Sinuses/Orbits: Patient status post ocular lens replacement on the left. Globes and orbital soft tissues demonstrate no acute finding. Paranasal sinuses are clear. Other: Right mastoid and middle ear effusion noted, of uncertain significance. Visualized nasopharynx grossly unremarkable. IMPRESSION: 1. Approximate 14 mm cortically based  hemorrhagic mass involving the anterior left frontal lobe, corresponding with abnormality seen on prior CT. Mild localized vasogenic edema without significant regional mass effect. Finding is indeterminate, but most concerning for a possible solitary intracranial metastasis given the patient's pulmonary history. Primary CNS neoplasm could also be considered. Further assessment with postcontrast imaging recommended for complete evaluation. 2. Underlying mild age-related cerebral atrophy with  mild-to-moderate chronic microvascular ischemic disease. Electronically Signed   By: Jeannine Boga M.D.   On: 01/29/2020 19:36   US RENAL  Result Date: 01/27/2020 CLINICAL DATA:  Acute renal insufficiency, renal cysts, renal calculi EXAM: RENAL / URINARY TRACT ULTRASOUND COMPLETE COMPARISON:  01/30/2020 FINDINGS: Right Kidney: Renal measurements: 17.1 x 10.1 x 7.7 cm = volume: 696 mL. Numerous right renal cysts are identified measuring up to 8.9 cm, unchanged since recent CT. There is increased renal cortical echotexture. Left Kidney: Renal measurements: 16.9 x 8.4 by 7.1 cm = volume: 524 mL. Multiple left renal cysts are identified largest measuring 6.5 cm. Mild increased renal cortical echotexture. Bladder: The bladder is decompressed. Other: None. IMPRESSION: 1. Numerous bilateral renal cortical cysts. 2. Increased renal cortical echotexture consistent with medical renal disease. Electronically Signed   By: Randa Ngo M.D.   On: 01/28/2020 23:27   DG CHEST PORT 1 VIEW  Result Date: 02/11/2020 CLINICAL DATA:  Dyspnea EXAM: PORTABLE CHEST 1 VIEW COMPARISON:  01/25/2020 8:38 a.m, chest CT 02/11/2020. FINDINGS: Single frontal view of the chest demonstrates stable right internal jugular catheter tip overlying superior vena cava. The cardiac silhouette is enlarged but stable. There is persistent but improving right basilar airspace disease. No effusion or pneumothorax. IMPRESSION: 1. Persistent but improving right basilar airspace disease, most compatible with postobstructive bronchopneumonia. Please refer to prior chest CT 01/28/2020 describing a central obstructive right hilar mass. Electronically Signed   By: Randa Ngo M.D.   On: 01/25/2020 17:59        Scheduled Meds: . budesonide (PULMICORT) nebulizer solution  0.25 mg Nebulization BID  . Chlorhexidine Gluconate Cloth  6 each Topical Daily  . folic acid  1 mg Oral Daily  . levETIRAcetam  500 mg Oral BID  . mouth rinse  15 mL  Mouth Rinse BID  . QUEtiapine  25 mg Oral QHS  . sodium chloride flush  10-40 mL Intracatheter Q12H  . vitamin B-12  1,000 mcg Oral Daily   Continuous Infusions: . sodium chloride 75 mL/hr at 02/09/2020 1804  . azithromycin 500 mg (01/23/2020 2000)  . cefTRIAXone (ROCEPHIN)  IV 1 g (02/11/2020 2114)     LOS: 1 day    Time spent: Spent more than 30 minutes in coordinating care for this patient including bedside patient care.   Lucky Cowboy, MD [facility] If 7PM-7AM, please contact night-coverage 02/01/2020, 8:33 AM   This document was prepared using Dragon voice recognition software and may contain some unintended transcription errors.

## 2020-02-01 NOTE — Evaluation (Signed)
Occupational Therapy Evaluation Patient Details Name: Shane Avila MRN: [id number] DOB: 1951-03-17 Today's Date: 02/01/2020    History of Present Illness Patient presented to the hospital with AKI, dyspnea, AMS. He previously had found a mass on his lung but failed to show up for his biopsy. 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 PMH: A-fib , chronic O2 use, depression, HTN, and hyperlipidemia   Clinical Impression   Patient is a 69 year old male that lives with his spouse in a single level home. Unable to obtain full history as patient becomes agitated with increased questions and is questionable historian. 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 Currently patient requires max A x2 for bed mobility, with decreased activity tolerance and dyspnea with sitting at edge of bed tolerating approximately 2-3 mins before having to return to supine. Due to current functional level recommend SNF, will continue to follow with acute OT services.     Follow Up Recommendations  SNF;Supervision/Assistance - 24 hour    Equipment Recommendations  Other (comment)(defer to next venue of care)       Precautions / Restrictions Precautions Precautions: Fall Restrictions Weight Bearing Restrictions: No      Mobility Bed Mobility Overal bed mobility: Needs Assistance Bed Mobility: Supine to Sit;Sit to Supine     Supine to sit: Max assist;+2 for safety/equipment;+2 for physical assistance Sit to supine: Max assist;+2 for physical assistance;+2 for safety/equipment   General bed mobility comments: Max A to elevate trunk and mobilize LEs to EOB, cues for use of bed rails. Max A for trunk control and lifting LEs onto bed to lay down  Transfers Overall transfer level: Needs assistance               General transfer comment: Patient unable to stand. he became SOB sitting at the edge of the bed and was  transfered back to a supine head elevated position     Balance Overall balance assessment: Needs assistance Sitting-balance support: Bilateral upper extremity supported;Feet supported Sitting balance-Leahy Scale: Poor Sitting balance - Comments: limited activity tolerance and use of UEs to support in sitting   Standing balance support: Single extremity supported Standing balance-Leahy Scale: Fair Standing balance comment: needed min guard sitting edge of th bed                            ADL either performed or assessed with clinical judgement   ADL Overall ADL's : Needs assistance/impaired     Grooming: Set up;Bed level   Upper Body Bathing: Moderate assistance;Sitting   Lower Body Bathing: Total assistance;Sitting/lateral leans;Bed level   Upper Body Dressing : Moderate assistance;Sitting   Lower Body Dressing: Total assistance;Bed level;Sitting/lateral leans   Toilet Transfer: +2 for physical assistance;+2 for safety/equipment Toilet Transfer Details (indicate cue type and reason): did not attempt to stand due to patient limited activity tolerance, strength, anticipate extensive assistance x2 to attempt transfer         Functional mobility during ADLs: Maximal assistance;+2 for physical assistance;+2 for safety/equipment(for bed mobility) General ADL Comments: patient requiring extensive assistance for self care due to decreased activity tolerance, strength, balance, safety awareness.                  Pertinent Vitals/Pain Pain Assessment: Faces Faces Pain Scale: Hurts whole lot Pain Location:  bilateral LE R>L  Pain Descriptors / Indicators: Grimacing;Guarding;Moaning Pain Intervention(s): Monitored during session;Repositioned;Limited activity within patient's tolerance     Hand Dominance Right   Extremity/Trunk Assessment Upper Extremity Assessment Upper Extremity Assessment: Generalized weakness   Lower Extremity Assessment Lower Extremity  Assessment: Defer to PT evaluation       Communication Communication Communication: No difficulties   Cognition Arousal/Alertness: Awake/alert Behavior During Therapy: Restless;Agitated;Impulsive Overall Cognitive Status: No family/caregiver present to determine baseline cognitive functioning                                 General Comments: Patient became agitated with increased questions, able to re-direct. patient oriented to place, time however having difficulty with task initation, problem solving, sequencing with bed mobility. pt becomes more lethargic once returned to bed with minimal eye opening when repeating his name              Home Living Family/patient expects to be discharged to:: Private residence Living Arrangements: Spouse/significant other Available Help at Discharge: Family Type of Home: House Home Access: Level entry     Home Layout: One level                   Additional Comments: Patient becoming agitated with increased questioning and is unreliable narrator      Prior Functioning/Environment Level of Independence: (unsure)        Comments: patient reports using a walker for ambulation due to arthritis        OT Problem List: Decreased strength;Decreased activity tolerance;Impaired balance (sitting and/or standing);Decreased cognition;Decreased safety awareness;Obesity;Pain      OT Treatment/Interventions: Self-care/ADL training;Therapeutic exercise;Energy conservation;DME and/or AE instruction;Therapeutic activities;Cognitive remediation/compensation;Patient/family education;Balance training    OT Goals(Current goals can be found in the care plan section) Acute Rehab OT Goals Patient Stated Goal: unable to state  OT Goal Formulation: Patient unable to participate in goal setting Time For Goal Achievement: 02/15/20 Potential to Achieve Goals: Fair  OT Frequency: Min 2X/week           Co-evaluation PT/OT/SLP  Co-Evaluation/Treatment: Yes Reason for Co-Treatment: Complexity of the patient's impairments (multi-system involvement);Necessary to address cognition/behavior during functional activity;For patient/therapist safety;To address functional/ADL transfers PT goals addressed during session: Balance;Mobility/safety with mobility;Proper use of DME;Strengthening/ROM OT goals addressed during session: ADL's and self-care      AM-PAC OT "6 Clicks" Daily Activity     Outcome Measure Help from another person eating meals?: None Help from another person taking care of personal grooming?: A Little Help from another person toileting, which includes using toliet, bedpan, or urinal?: Total Help from another person bathing (including washing, rinsing, drying)?: A Lot Help from another person to put on and taking off regular upper body clothing?: A Lot Help from another person to put on and taking off regular lower body clothing?: Total 6 Click Score: 13   End of Session Equipment Utilized During Treatment: Oxygen Nurse Communication: Mobility status  Activity Tolerance: Patient limited by fatigue Patient left: in bed;with call bell/phone within reach;with bed alarm set  OT Visit Diagnosis: Other abnormalities of gait and mobility (R26.89);Muscle weakness (generalized) (M62.81);Other symptoms and signs involving cognitive function                Time: 1130-1153 OT Time Calculation (min): 23 min Charges:  OT General Charges $OT Visit: 1 Visit OT Evaluation $OT Eval Moderate Complexity: [place] OT OT office:  [phone]  Rosemary Holms 02/01/2020, 1:46 PM

## 2020-02-01 NOTE — Progress Notes (Signed)
Pt had brain w/o contrast yesterday. I recommended just doing an MRI W but NP wanted full exam repeated. Pt was moving during entire exam and  refused to continue during seq prior to contrast. I tried to convince pt to continue but he refused.

## 2020-02-01 NOTE — Progress Notes (Signed)
Discussed plan of care with daughter Jackelyn Poling and wife Katharine Look via telephone.  Informed MRI brain with contrast was not completed due to patient refusal.  Also noted, he refused bronchoscopy.  Patient does not have [facility] POA.  Wife Katharine Look will be making decisions for him as he is confused.  Debbie and her sister will attempt to speak with the patient to convince him for the bronchoscopy.  Discussed CODE STATUS and they would like him to be a full code at this time.  They would not want him on the ventilator permanently.  Budd Palmer, MD Internal Medicine  Hospitalist

## 2020-02-02 LAB — CBC
HCT: 34.9 % — ABNORMAL LOW (ref 39.0–52.0)
Hemoglobin: 10.5 g/dL — ABNORMAL LOW (ref 13.0–17.0)
MCH: 25.2 pg — ABNORMAL LOW (ref 26.0–34.0)
MCHC: 30.1 g/dL (ref 30.0–36.0)
MCV: 83.9 fL (ref 80.0–100.0)
Platelets: 265 10*3/uL (ref 150–400)
RBC: 4.16 MIL/uL — ABNORMAL LOW (ref 4.22–5.81)
RDW: 15.9 % — ABNORMAL HIGH (ref 11.5–15.5)
WBC: 9.2 10*3/uL (ref 4.0–10.5)
nRBC: 0 % (ref 0.0–0.2)

## 2020-02-02 LAB — BASIC METABOLIC PANEL
Anion gap: 13 (ref 5–15)
BUN: 29 mg/dL — ABNORMAL HIGH (ref 8–23)
CO2: 23 mmol/L (ref 22–32)
Calcium: 9.7 mg/dL (ref 8.9–10.3)
Chloride: 104 mmol/L (ref 98–111)
Creatinine, Ser: 1.12 mg/dL (ref 0.61–1.24)
GFR calc Af Amer: >60 mL/min (ref >60)
GFR calc non Af Amer: >60 mL/min (ref >60)
Glucose, Bld: 123 mg/dL — ABNORMAL HIGH (ref 70–99)
Potassium: 4.1 mmol/L (ref 3.5–5.1)
Sodium: 140 mmol/L (ref 135–145)

## 2020-02-02 MED ORDER — ATORVASTATIN CALCIUM 40 MG PO TABS
40.0000 mg | ORAL_TABLET | Freq: Every day | ORAL | Status: DC
  Administered 2020-02-02 – 2020-02-09 (×3): 40 mg via ORAL
  Filled 2020-02-02 (×5): qty 1

## 2020-02-02 MED ORDER — NEBIVOLOL HCL 5 MG PO TABS
5.0000 mg | ORAL_TABLET | Freq: Every day | ORAL | Status: DC
  Administered 2020-02-02 – 2020-02-10 (×3): 5 mg via ORAL
  Filled 2020-02-02 (×8): qty 1

## 2020-02-02 MED ORDER — PANTOPRAZOLE SODIUM 40 MG PO TBEC
40.0000 mg | DELAYED_RELEASE_TABLET | Freq: Every day | ORAL | Status: DC
  Administered 2020-02-02 – 2020-02-03 (×2): 40 mg via ORAL
  Filled 2020-02-02 (×3): qty 1

## 2020-02-02 NOTE — Plan of Care (Signed)
  Problem: Clinical Measurements: Goal: Respiratory complications will improve Outcome: Progressing Goal: Cardiovascular complication will be avoided Outcome: Progressing   

## 2020-02-02 NOTE — Plan of Care (Signed)

## 2020-02-02 NOTE — Progress Notes (Addendum)
PROGRESS NOTE    Shane Avila  ZOX:[id number] DOB: 02/27/1951 DOA: 02/10/2020 PCP: Bonnita Nasuti, MD    Brief Narrative:  69 year old Caucasian male with PMH of atrial fibrillation, tobacco abuse, right lung mass initially seen on imaging on 12/03/2019 initially presented to [facility] for altered mental status and insomnia ongoing for about 3 weeks, found to have small area of hemorrhage on CT head.  Right lung mass was redemonstrated on CT chest.  Was also noted to have changes consistent with postobstructive pneumonia on chest imaging.  He was started on antibiotics and transferred to [facility] for further evaluation with MRI and specialty care.  Per documentation, he had a cardiac arrest during initial attempt to have a biopsy and was subsequently scheduled to have the biopsy done on the day of presentation to the hospital.  Seen by neurology who recommended Keppra and MRI with contrast when renal function allows.  He is refused to complete his repeat MRI with contrast.   Assessment & Plan:   Active Problems:   SAH (subarachnoid hemorrhage) (HCC)   AKI (acute kidney injury) ([place])   Lung mass   Lobar pneumonia (HCC)   Brain mass   Acute encephalopathy  Acute encephalopathy Likely multifactorial in setting of infection, brain lesion Management as below Vitamin W11, folic acid low.  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  Concern for solitary metastasis versus primary CNS neoplasm. EEG showed no seizure or epileptiform discharges Unable to complete MRI with and without contrast due to patient refusal Spoke with Dr. Learta Codding with  oncology on 02/01/2020 who recommended low-dose dexamethasone 8 mg twice daily orally for 2 days followed by 4 mg twice daily; also recommended pulmonology consult for bronchoscopy.  Recommended patient can follow-up with oncologist in [place] as it would be closer to his residence.  To call back oncology if further assistance required. Neurology on board-continue Keppra, recommend calling Dr. Mickeal Skinner with neuro oncology on Monday, 02/03/2020.  Right lung mass Suspicious for lung cancer with significant history of tobacco smoking CT chest without contrast on 02/06/2020 showed 'redemonstration of a large subcarinal mass difficult to fully characterize and absence of contrast but appears to be progressively narrowing the carina and proximal left mainstem bronchus as well is encasing and completely occluding the right mainstem bronchus and bronchus intermedius.  Progressive multifocal areas of airspace opacity throughout the right lung worrisome for postobstructive pneumonia.' Pulmonology on board for bronchoscopy to get  biopsy  Postobstructive pneumonia CT chest on 01/22/2020 showed multifocal opacities concerning for postobstructive pneumonia Continue Zosyn, azithromycin  Acute renal failure Creatinine 2.06 on admission.  Unclear baseline, renal ultrasound consistent with medical renal disease. Likely prerenal-in setting of poor p.o. intake, diuretics Improving Continue IV fluids Avoid nephrotoxic drugs, renally dose all medications Daily BMP  Acute hypoxic respiratory failure Requiring 3 L oxygen via nasal cannula Multifactorial in setting of lung mass and pneumonia  Hypotension Likely in setting of poor p.o. intake, volume depletion, infection, continued antihypertensive use Improved with fluid resuscitation  Atrial fibrillation On nebivolol 20 mg at home. Start low-dose nebivolol at 5 mg daily, uptitrate as blood pressure tolerates   DVT prophylaxis: SCD/Compression stockings  (avoiding chemical prophylaxis in setting  of CNS hemorrhage) Code Status: Full code  Family Communication:  Disposition Plan: Pending medical stability, admitted from home, PT/OT recommend SNF   Consultants:   Neurology, pulmonology called for bronchoscopy  Procedures:  None  Antimicrobials:  Anti-infectives (From admission, onward)   Start     Dose/Rate Route Frequency Ordered Stop   02/01/20 1800  piperacillin-tazobactam (ZOSYN) IVPB 3.375 g     3.375 g 12.5 mL/hr over 240 Minutes Intravenous Every 8 hours 02/01/20 1048     02/01/20 1130  piperacillin-tazobactam (ZOSYN) IVPB 3.375 g     3.375 g 100 mL/hr over 30 Minutes Intravenous  Once 02/01/20 1048 02/01/20 1206   01/30/2020 1730  cefTRIAXone (ROCEPHIN) 1 g in sodium chloride 0.9 % 100 mL IVPB  Status:  Discontinued     1 g 200 mL/hr over 30 Minutes Intravenous Every 24 hours 02/11/2020 1721 02/01/20 1010   01/28/2020 1730  azithromycin (ZITHROMAX) 500 mg in sodium chloride 0.9 % 250 mL IVPB     500 mg 250 mL/hr over 60 Minutes Intravenous Every 24 hours 01/24/2020 1721          Subjective: Awake, alert.  Much more lucid than yesterday.  Denies any complaints.  Says he will quit smoking.  Declines nicotine patch.  He is agreeable for biopsy now.  Objective: Vitals:   02/01/20 2145 02/02/20 0123 02/02/20 0223 02/02/20 0320  BP:  108/73  91/63  Pulse: (!) 128 (!) 152 93 97  Resp: (!) 23  20 20   Temp: 98.1 F (36.7 C)   97.7 F (36.5 C)  TempSrc: Oral   Oral  SpO2:  92% 93% 93%  Weight:    (!) 140.6 kg    Intake/Output Summary (Last 24 hours) at 02/02/2020 9892 Last data filed at 02/02/2020 0506 Gross per 24 hour  Intake 595.45 ml  Output 2200 ml  Net -1604.55 ml   Filed Weights   02/01/20 0500 02/02/20 0320  Weight: (!) 142.6 kg (!) 140.6 kg    Examination:  General exam: Awake, alert, answering questions appropriately, calm, following commands appropriately Respiratory system: Bilateral rhonchi present, mild  accessory muscle use on 3 L oxygen via nasal cannula Cardiovascular system: S1 & S2 heard, RRR. No JVD, murmurs.  Puffy feet but no pitting pedal edema Gastrointestinal system: Abdomen is nondistended, soft and nontender.  Normal bowel sounds heard. Central nervous system: Alert, oriented x3.  Pupils equal and reactive to light.  Uses both upper extremities voluntarily Extremities: No significant edema Skin: No rashes, lesions or ulcers Psychiatry: Alert, oriented x3.  Knows why he is in the hospital.  Appears to be delusional stating the preacher will heal him.   Right IJ in place, Foley catheter in place  Data Reviewed: I have personally reviewed following labs and imaging studies  CBC: Recent Labs  Lab 02/15/2020 1727 02/01/20 0518 02/02/20 0755  WBC 13.2* 11.4* 9.2  HGB 10.8* 10.1* 10.5*  HCT 35.0* 33.4* 34.9*  MCV 84.1 85.0 83.9  PLT 278 274 119   Basic Metabolic Panel: Recent Labs  Lab 02/03/2020 1727 02/01/20 0518 02/01/20 1103  NA 135 137  --   K 3.9 3.6  --   CL 99 100  --   CO2 23 25  --   GLUCOSE 95 86  --   BUN 52* 42*  --   CREATININE 2.06* 1.45*  --   CALCIUM 9.4 9.1  --   MG  --   --  1.9   GFR:  CrCl cannot be calculated (Unknown ideal weight.). Liver Function Tests: Recent Labs  Lab 01/22/2020 1727 02/01/20 0518  AST 23 21  ALT 20 20  ALKPHOS 108 111  BILITOT 1.0 1.0  PROT 6.9 6.2*  ALBUMIN 2.2* 2.1*   No results for input(s): LIPASE, AMYLASE in the last 168 hours. No results for input(s): AMMONIA in the last 168 hours. Coagulation Profile: Recent Labs  Lab 02/01/20 0518  INR 1.3*   Cardiac Enzymes: No results for input(s): CKTOTAL, CKMB, CKMBINDEX, TROPONINI in the last 168 hours. BNP (last 3 results) No results for input(s): PROBNP in the last 8760 hours. HbA1C: No results for input(s): HGBA1C in the last 72 hours. CBG: No results for input(s): GLUCAP in the last 168 hours. Lipid Profile: No results for input(s): CHOL, HDL, LDLCALC,  TRIG, CHOLHDL, LDLDIRECT in the last 72 hours. Thyroid Function Tests: No results for input(s): TSH, T4TOTAL, FREET4, T3FREE, THYROIDAB in the last 72 hours. Anemia Panel: Recent Labs    02/01/20 0518 02/01/20 0519  VITAMINB12 227  --   FOLATE  --  2.9*  FERRITIN 212  --   TIBC 181*  --   IRON 21*  --   RETICCTPCT  --  1.0   Sepsis Labs: No results for input(s): PROCALCITON, LATICACIDVEN in the last 168 hours.  Recent Results (from the past 240 hour(s))  Culture, blood (routine x 2)     Status: None (Preliminary result)   Collection Time: 02/03/2020  5:27 PM   Specimen: BLOOD LEFT HAND  Result Value Ref Range Status   Specimen Description BLOOD LEFT HAND  Final   Special Requests   Final    BOTTLES DRAWN AEROBIC ONLY Blood Culture adequate volume   Culture   Final    NO GROWTH < 24 HOURS Performed at [facility], [address], [place], [place] [address]    Report Status PENDING  Incomplete  Culture, blood (routine x 2)     Status: None (Preliminary result)   Collection Time: 01/27/2020  5:27 PM   Specimen: BLOOD LEFT HAND  Result Value Ref Range Status   Specimen Description BLOOD LEFT HAND  Final   Special Requests   Final    BOTTLES DRAWN AEROBIC ONLY Blood Culture adequate volume   Culture   Final    NO GROWTH < 24 HOURS Performed at [facility], [place] [address], [place], [place] [address]    Report Status PENDING  Incomplete  SARS CORONAVIRUS 2 (TAT 6-24 HRS) Nasopharyngeal Nasopharyngeal Swab     Status: None   Collection Time: 02/02/2020  6:12 PM   Specimen: Nasopharyngeal Swab  Result Value Ref Range Status   SARS Coronavirus 2 NEGATIVE NEGATIVE Final    Comment: (NOTE) SARS-CoV-2 target nucleic acids are NOT DETECTED. The SARS-CoV-2 RNA is generally detectable in upper and lower respiratory specimens during the acute phase of infection. Negative results do not preclude SARS-CoV-2 infection, do not rule out co-infections with other pathogens, and  should not be used as the sole basis for treatment or other patient management decisions. Negative results must be combined with clinical observations, patient history, and epidemiological information. The expected result is Negative. Fact Sheet for Patients: SugarRoll.be Fact Sheet for Healthcare Providers: https://www.woods-mathews.com/ This test is not yet approved or cleared by the [place] FDA and  has been authorized for detection and/or diagnosis of SARS-CoV-2 by FDA under an Emergency Use Authorization (EUA). This EUA will remain  in effect (meaning  this test can be used) for the duration of the COVID-19 declaration under Section 56 4(b)(1) of the Act, 21 U.S.C. section 360bbb-3(b)(1), unless the authorization is terminated or revoked sooner. Performed at [facility], [place] [address], [place], [place] [address]          Radiology Studies: EEG  Result Date: 02/01/2020 Lora Havens, MD     02/01/2020 10:08 AM Patient Name: Shane Avila MRN: [id number] Epilepsy Attending: Lora Havens Referring Physician/Provider: Dr Amie [place] Date: 02/01/2020 Duration: 22.41 mins Patient history: 69yo M with ams. MRI brain showed possible hemorrhagic metastatic lesion in the left frontal lobe which is cortically based. EEG to evaluate for seizure. Level of alertness: awake AEDs during EEG study: keppra Technical aspects: This EEG study was done with scalp electrodes positioned according to the 10-20 International system of electrode placement. Electrical activity was acquired at a sampling rate of 500Hz  and reviewed with a high frequency filter of 70Hz  and a low frequency filter of 1Hz . EEG data were recorded continuously and digitally stored. DESCRIPTION: The posterior dominant rhythm consists of 8-9Hz  activity of moderate voltage (25-35 uV) seen predominantly in posterior head regions, symmetric and reactive to eye opening and eye  closing.  Photic driving was seen in photic stimulation. Hyperventilation was not performed. IMPRESSION: This study is within normal limits. No seizures or epileptiform discharges were seen throughout the recording. However, only wakefulness was recorded. If suspicion for interictal activity remains a concern, a prolonged study including sleep should be considered. Lora Havens   MR BRAIN WO CONTRAST  Result Date: 02/01/2020 CLINICAL DATA:  Brain mass or lesion. Additional history obtained from [place] of lung mass EXAM: MRI HEAD WITHOUT CONTRAST TECHNIQUE: Multiplanar, multiecho pulse sequences of the brain and surrounding structures were obtained without intravenous contrast. COMPARISON:  Brain MRI 01/26/2020 FINDINGS: Brain: The examination was prematurely terminated at the patient's request. Only axial and coronal diffusion-weighted imaging, sagittal T1 weighted imaging, axial T2 weighted imaging, axial T2 FLAIR imaging, axial T2* imaging and axial T1 FLAIR imaging was obtained. Postcontrast imaging could not be obtained. The acquired sequences are motion degraded. Most notably there is moderate motion degradation of the sagittal T1 weighted sequence and moderate motion degradation of the axial T2 FLAIR sequence. Unchanged from prior examination 01/25/2020, there is a 13 x 14 mm cortically based mass within the anterior left frontal lobe with associated susceptibility artifact compatible with blood products. Unchanged small amount of associated edema at this site. There is no effacement of the ventricular system or midline shift. No evidence of acute infarct. Background mild scattered T2/FLAIR hyperintensity within the cerebral white matter is nonspecific, but consistent with chronic small vessel ischemic disease. Mild generalized parenchymal atrophy. There is no extra-axial fluid collection. Vascular: Flow voids maintained within the proximal large arterial vessels. Skull and  upper cervical spine: Within the limitations of motion degradation, no focal marrow lesion is identified. Sinuses/Orbits: Visualized orbits demonstrate no acute abnormality. Mild ethmoid sinus mucosal thickening. Redemonstrated right mastoid effusion. IMPRESSION: Incomplete and motion degraded examination as described. The examination was prematurely terminated at the patient's request, prior to post-contrast imaging. Unchanged 14 mm cortically based hemorrhagic mass involving the anterior left frontal lobe with associated mild edema. Findings are again indeterminate, but most concerning for a metastasis. Primary CNS neoplasm is also a consideration. Postcontrast imaging is recommended when the patient is better able to tolerate the study. Mild generalized parenchymal atrophy and chronic small vessel ischemic disease.  Right mastoid effusion. Electronically Signed   By: Kellie Simmering DO   On: 02/01/2020 14:30   MR BRAIN WO CONTRAST  Result Date: 02/03/2020 CLINICAL DATA:  Follow-up examination for subarachnoid hemorrhage. History of right lung mass, smoking. EXAM: MRI HEAD WITHOUT CONTRAST TECHNIQUE: Multiplanar, multiecho pulse sequences of the brain and surrounding structures were obtained without intravenous contrast. COMPARISON:  Prior head CT from 01/30/2020. FINDINGS: Brain: Examination moderately degraded by motion artifact. Diffuse prominence of the CSF containing spaces compatible with generalized age-related cerebral atrophy. Patchy T2/FLAIR hyperintensity within the periventricular and deep white matter both cerebral hemispheres most consistent with chronic small vessel ischemic disease, mild to moderate in nature. No abnormal foci of restricted diffusion to suggest acute or subacute ischemia. No encephalomalacia to suggest chronic cortical infarction. There is an approximate 13 x 14 mm cortically based mass positioned at the anterior left frontal lobe, corresponding with abnormality seen on prior CT  (series 15, image 17 associated susceptibility artifact compatible with blood products, also seen on prior CT. No other definite discrete masses identified on this noncontrast examination. No midline shift. No hydrocephalus. No extra-axial fluid collection. Pituitary gland suprasellar region normal. Midline structures intact.). Exact measurements somewhat difficult given lack of IV contrast and motion artifact. Small amount of localized vasogenic edema without significant regional mass effect. Vascular: Major intracranial vascular flow voids are maintained. Skull and upper cervical spine: Craniocervical junction within normal limits. Visualized upper cervical spine grossly unremarkable. Bone marrow signal intensity within normal limits. No focal marrow replacing lesion. Scalp soft tissues unremarkable. Sinuses/Orbits: Patient status post ocular lens replacement on the left. Globes and orbital soft tissues demonstrate no acute finding. Paranasal sinuses are clear. Other: Right mastoid and middle ear effusion noted, of uncertain significance. Visualized nasopharynx grossly unremarkable. IMPRESSION: 1. Approximate 14 mm cortically based hemorrhagic mass involving the anterior left frontal lobe, corresponding with abnormality seen on prior CT. Mild localized vasogenic edema without significant regional mass effect. Finding is indeterminate, but most concerning for a possible solitary intracranial metastasis given the patient's pulmonary history. Primary CNS neoplasm could also be considered. Further assessment with postcontrast imaging recommended for complete evaluation. 2. Underlying mild age-related cerebral atrophy with mild-to-moderate chronic microvascular ischemic disease. Electronically Signed   By: Jeannine Boga M.D.   On: 02/07/2020 19:36   US RENAL  Result Date: 01/25/2020 CLINICAL DATA:  Acute renal insufficiency, renal cysts, renal calculi EXAM: RENAL / URINARY TRACT ULTRASOUND COMPLETE  COMPARISON:  01/30/2020 FINDINGS: Right Kidney: Renal measurements: 17.1 x 10.1 x 7.7 cm = volume: 696 mL. Numerous right renal cysts are identified measuring up to 8.9 cm, unchanged since recent CT. There is increased renal cortical echotexture. Left Kidney: Renal measurements: 16.9 x 8.4 by 7.1 cm = volume: 524 mL. Multiple left renal cysts are identified largest measuring 6.5 cm. Mild increased renal cortical echotexture. Bladder: The bladder is decompressed. Other: None. IMPRESSION: 1. Numerous bilateral renal cortical cysts. 2. Increased renal cortical echotexture consistent with medical renal disease. Electronically Signed   By: Randa Ngo M.D.   On: 01/20/2020 23:27   DG CHEST PORT 1 VIEW  Result Date: 01/30/2020 CLINICAL DATA:  Dyspnea EXAM: PORTABLE CHEST 1 VIEW COMPARISON:  01/22/2020 8:38 a.m, chest CT 02/12/2020. FINDINGS: Single frontal view of the chest demonstrates stable right internal jugular catheter tip overlying superior vena cava. The cardiac silhouette is enlarged but stable. There is persistent but improving right basilar airspace disease. No effusion or pneumothorax. IMPRESSION: 1. Persistent but improving right basilar  airspace disease, most compatible with postobstructive bronchopneumonia. Please refer to prior chest CT 02/03/2020 describing a central obstructive right hilar mass. Electronically Signed   By: Randa Ngo M.D.   On: 01/30/2020 17:59        Scheduled Meds: . arformoterol  15 mcg Nebulization BID  . Chlorhexidine Gluconate Cloth  6 each Topical Daily  . dexamethasone  8 mg Oral Q12H   Followed by  . [START ON 02/03/2020] dexamethasone  4 mg Oral Q12H  . folic acid  1 mg Oral Daily  . levETIRAcetam  500 mg Oral BID  . mouth rinse  15 mL Mouth Rinse BID  . sodium chloride flush  10-40 mL Intracatheter Q12H  . vitamin B-12  1,000 mcg Oral Daily   Continuous Infusions: . sodium chloride 75 mL/hr at 02/01/20 1107  . azithromycin 500 mg (02/01/20 1719)   . piperacillin-tazobactam (ZOSYN)  IV 3.375 g (02/02/20 0208)     LOS: 2 days    Time spent: Spent more than 30 minutes in coordinating care for this patient including bedside patient care.   Lucky Cowboy, MD [facility] If 7PM-7AM, please contact night-coverage 02/02/2020, 8:23 AM   This document was prepared using Dragon voice recognition software and may contain some unintended transcription errors.

## 2020-02-03 DIAGNOSIS — G9389 Other specified disorders of brain: Secondary | ICD-10-CM

## 2020-02-03 DIAGNOSIS — R05 Cough: Secondary | ICD-10-CM

## 2020-02-03 DIAGNOSIS — R410 Disorientation, unspecified: Secondary | ICD-10-CM

## 2020-02-03 DIAGNOSIS — C7931 Secondary malignant neoplasm of brain: Secondary | ICD-10-CM

## 2020-02-03 DIAGNOSIS — R0600 Dyspnea, unspecified: Secondary | ICD-10-CM

## 2020-02-03 DIAGNOSIS — R220 Localized swelling, mass and lump, head: Secondary | ICD-10-CM

## 2020-02-03 DIAGNOSIS — N179 Acute kidney failure, unspecified: Secondary | ICD-10-CM

## 2020-02-03 LAB — BASIC METABOLIC PANEL
Anion gap: 11 (ref 5–15)
BUN: 30 mg/dL — ABNORMAL HIGH (ref 8–23)
CO2: 26 mmol/L (ref 22–32)
Calcium: 9.8 mg/dL (ref 8.9–10.3)
Chloride: 103 mmol/L (ref 98–111)
Creatinine, Ser: 1.23 mg/dL (ref 0.61–1.24)
GFR calc Af Amer: >60 mL/min (ref >60)
GFR calc non Af Amer: >60 mL/min (ref >60)
Glucose, Bld: 131 mg/dL — ABNORMAL HIGH (ref 70–99)
Potassium: 3.9 mmol/L (ref 3.5–5.1)
Sodium: 140 mmol/L (ref 135–145)

## 2020-02-03 LAB — CBC
HCT: 37.5 % — ABNORMAL LOW (ref 39.0–52.0)
Hemoglobin: 11.3 g/dL — ABNORMAL LOW (ref 13.0–17.0)
MCH: 26 pg (ref 26.0–34.0)
MCHC: 30.1 g/dL (ref 30.0–36.0)
MCV: 86.4 fL (ref 80.0–100.0)
Platelets: 327 10*3/uL (ref 150–400)
RBC: 4.34 MIL/uL (ref 4.22–5.81)
RDW: 16 % — ABNORMAL HIGH (ref 11.5–15.5)
WBC: 12.2 10*3/uL — ABNORMAL HIGH (ref 4.0–10.5)
nRBC: 0 % (ref 0.0–0.2)

## 2020-02-03 LAB — GLUCOSE, CAPILLARY: Glucose-Capillary: 123 mg/dL — ABNORMAL HIGH (ref 70–99)

## 2020-02-03 MED ORDER — PHENYLEPHRINE HCL 0.25 % NA SOLN
1.0000 | Freq: Four times a day (QID) | NASAL | Status: DC | PRN
  Filled 2020-02-03: qty 15

## 2020-02-03 MED ORDER — BUTAMBEN-TETRACAINE-BENZOCAINE 2-2-14 % EX AERO
1.0000 | INHALATION_SPRAY | Freq: Once | CUTANEOUS | Status: DC
  Filled 2020-02-03: qty 20

## 2020-02-03 MED ORDER — LIDOCAINE HCL URETHRAL/MUCOSAL 2 % EX GEL
1.0000 "application " | Freq: Once | CUTANEOUS | Status: DC
  Filled 2020-02-03: qty 20

## 2020-02-03 NOTE — TOC Initial Note (Signed)
Transition of Care [facility]) - Initial/Assessment Note    Patient Details  Name: Shane Avila MRN: [id number] Date of Birth: [birth year]  Transition of Care [facility]) CM/SW Contact:    Arvella Merles, LCSW Phone Number: 02/03/2020, 12:59 PM  Clinical Narrative:                 CSW received consult for possible SNF placement at time of discharge. CSW spoke with patient's wife Shane Avila [phone]  regarding PT recommendation of SNF placement at time of discharge. Patient's spouse reported that patient would not be in agreement with SNF and she is waiting for more medical information. Patient's daughter Shane Avila was in the background of the call and stated the family would like more medical information before making a decision on discharge plans. CSW to continue to follow and assist with discharge planning needs.   Expected Discharge Plan: Skilled Nursing Facility Barriers to Discharge: Continued Medical Work up   Patient Goals and CMS Choice   CMS Medicare.gov Compare Post Acute Care list provided to:: Patient Represenative (must comment)(Shane Avila, wife) Choice offered to / list presented to : Spouse  Expected Discharge Plan and Services Expected Discharge Plan: [place] arrangements for the past 2 months: Single Family Home                                      Prior Living Arrangements/Services Living arrangements for the past 2 months: Single Family Home Lives with:: Significant Other Patient language and need for interpreter reviewed:: Yes Do you feel safe going back to the place where you live?: Yes      Need for Family Participation in Patient Care: Yes (Comment) Care giver support system in place?: Yes (comment)   Criminal Activity/Legal Involvement Pertinent to Current Situation/Hospitalization: No - Comment as needed  Activities of Daily Living      Permission Sought/Granted Permission sought to share information with : Family  Supports, Customer service manager    Share Information with NAME: Shane Avila  Permission granted to share info w AGENCY: SNF  Permission granted to share info w Relationship: Wife  Permission granted to share info w Contact Information: [phone]  Emotional Assessment   Attitude/Demeanor/Rapport: Unable to Assess Affect (typically observed): Unable to Assess Orientation: : Oriented to Place, Oriented to Self Alcohol / Substance Use: Not Applicable Psych Involvement: No (comment)  Admission diagnosis:  SAH (subarachnoid hemorrhage) ([place]) [I60.9] Patient Active Problem List   Diagnosis Date Noted  . Brain mass 02/01/2020  . Acute encephalopathy 02/01/2020  . SAH (subarachnoid hemorrhage) ([place]) 02/14/2020  . AKI (acute kidney injury) ([place]) 01/24/2020  . Lung mass 02/15/2020  . Lobar pneumonia ([place]) 02/06/2020   PCP:  Bonnita Nasuti, MD Pharmacy:   [facility], [place] [facility] [place] [address] [place] [address] Phone: [phone] Fax: [phone]  [place], [place] - [place] [address] [place] [address] Phone: [phone] Fax: [phone]     Social Determinants of Health (SDOH) Interventions    Readmission Risk Interventions No flowsheet data found.

## 2020-02-03 NOTE — Progress Notes (Addendum)
NAME:  Shane Avila, MRN:  [id number], DOB:  April 11, 1951, LOS: 3 ADMISSION DATE:  02/14/2020, CONSULTATION DATE: 02/01/2020 REFERRING MD: Dr. Tyrell Antonio, CHIEF COMPLAINT: Subcarinal mass  Brief History   69 year old former smoker (110 pack years) with a history of COPD and chronic hypoxemic respiratory failure on 2 L/min.  Also with hypertension, hyperlipidemia, atrial fibrillation and depression.  He apparently is undergoing work-up for a subcarinal, right hilar mass that was first discovered on CT chest 11/13/2019 at [place].  Biopsy was planned but has not yet been done. He was evaluated in the ED at [facility] 2/11 for confusion and "bizarre behavior".  CT head revealed a left frontal small hyperdense area, question SAH versus mass.  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  His other work-up revealed acute renal failure (improving).  Past Medical History  COPD Chronic hypoxemic respiratory failure Hypertension Hyperlipidemia Atrial fibrillation Depression COVID-19 pneumonia in October 2020, did not require hospital admission  Significant Hospital Events   2/12 Admit   Consults:  Neurology PCCM  Procedures:    Significant Diagnostic Tests:  CT chest Oval Linsey) 01/30/2020 >> 5.1 x 4.8 cm subcarinal mass that impacts the main carina and extrinsically compresses the right mainstem bronchus, right upper lobe airway and bronchus intermedius.  Unclear whether there is an endobronchial lesion.  Significant right lower lobe volume loss/atelectasis/collapse vs post-obstructive PNA.  Small right effusion MRI Brain w/o 2/12 >> approximate 40mm cortically based hemorrhagic mass inovlving the anterior left frontal lobe, mild vasogenic edema w/o significant regional mass effect MRI Brain w/o 2/13 >> motion degraded exam, unchanged 14 mm cortically based hemorrhagic mass   Micro Data:   SARSCov2 2/12 >> negative Blood 2/12 >>  Respiratory 2/12 >>  Antimicrobials:  Ceftriaxone 2/12 x1 Azithromycin 2/12 >>  Zosyn 2/13 >>   Interim history/subjective:  Pt reports he is aware of mass in lung / brain.  States he is willing to do whatever he needs for appropriate work up.   Afebrile  Objective   Blood pressure 107/71, pulse 83, temperature 97.6 F (36.4 C), temperature source Oral, resp. rate 18, weight (!) 138.7 kg, SpO2 98 %.        Intake/Output Summary (Last 24 hours) at 02/03/2020 1117 Last data filed at 02/03/2020 1050 Gross per 24 hour  Intake --  Output 485 ml  Net -485 ml   Filed Weights   02/01/20 0500 02/02/20 0320 02/03/20 0430  Weight: (!) 142.6 kg (!) 140.6 kg (!) 138.7 kg    Examination: General: adult male lying in bed in NAD HEENT: MM pink/dry, poor dentition, anicteric  Neuro: awake, alert, oriented to self, place, time / events.  MAE.  States he is willing to have procedures to work up malignant concerns. Tearful when talking about his wife.  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 -case reviewed with Neurosurgery, given no contrasted images and inability to determine actual size of mass vs contribution of hemorrhage.  Recommendations would be for contrasted imaging now > if large component of hemorrhage, would wait several weeks  to allow hemorrhage to clear and then consider resection.  Dr. Nickolas Madrid ([place]) made aware of case as well to follow peripherally / for Tumor Board review, appreciate assistance. -will proceed with video bronch with EBUS arranged for 0730 2/26  -consider  initial FOB evaluation first for endobronchial lesion, then advance to EBUS if no endobronchial lesion present  -NPO after MN 2/16 -Per prior documentation, family are in agreement for biopsy, appears he has had some confusion but is clear today.  Will need to call and discuss plan of care with family. -pre-bronch orders placed   Suspected postobstructive pneumonia -continue zosyn, azithro for now  COPD -Continue Brovana BID  -hold pulmicort while on dexamethasone  -Duoneb as needed  -consider adding spiriva respimat at discharge   Encephalopathy, confusion   Suspect largely due to his left frontal lobe lesion.  Consider also contribution of metabolic status, his renal insufficiency.  EEG 2/13 reassuring.   -Neurology following, appreciate input  -seizure precautions  -keppra 500 mg BID  -decadron   Acute renal failure, improving -Trend BMP / urinary output -Replace electrolytes as indicated -Avoid nephrotoxic agents, ensure adequate renal perfusion   Labs   CBC: Recent Labs  Lab 02/05/2020 1727 02/01/20 0518 02/02/20 0755 02/03/20 0450  WBC 13.2* 11.4* 9.2 12.2*  HGB 10.8* 10.1* 10.5* 11.3*  HCT 35.0* 33.4* 34.9* 37.5*  MCV 84.1 85.0 83.9 86.4  PLT 278 274 265 595    Basic Metabolic Panel: Recent Labs  Lab 02/03/2020 1727 02/01/20 0518 02/01/20 1103 02/02/20 0755 02/03/20 0450  NA 135 137  --  140 140  K 3.9 3.6  --  4.1 3.9  CL 99 100  --  104 103  CO2 23 25  --  23 26  GLUCOSE 95 86  --  123* 131*  BUN 52* 42*  --  29* 30*  CREATININE 2.06* 1.45*  --  1.12 1.23  CALCIUM 9.4 9.1  --  9.7 9.8  MG  --   --  1.9  --   --    GFR: CrCl cannot be calculated (Unknown ideal weight.). Recent Labs  Lab 02/10/2020 1727 02/01/20 0518 02/02/20 0755 02/03/20 0450  WBC 13.2* 11.4* 9.2 12.2*    Liver Function Tests: Recent Labs  Lab 01/20/2020 1727 02/01/20 0518  AST 23 21  ALT 20 20  ALKPHOS 108 111  BILITOT 1.0 1.0  PROT 6.9 6.2*  ALBUMIN 2.2* 2.1*   No  results for input(s): LIPASE, AMYLASE in the last 168 hours. No results for input(s): AMMONIA in the last 168 hours.  ABG No results found for: PHART, PCO2ART, PO2ART, HCO3, TCO2, ACIDBASEDEF, O2SAT   Coagulation Profile: Recent Labs  Lab 02/01/20 0518  INR 1.3*    Cardiac Enzymes: No results for input(s): CKTOTAL, CKMB, CKMBINDEX, TROPONINI in the last 168 hours.  HbA1C: No results found for: HGBA1C  CBG: Recent Labs  Lab 02/03/20 0746  GLUCAP 123*     Critical care time: n/a.      Noe Gens, MSN, NP-C [facility] 02/03/2020, 11:17 AM  Please see [email or web address] for pager details.    PCCM attending:  This is a 69 year old gentleman longstanding history of tobacco abuse found to have a lung mass.  MRI of the brain revealed a 14 mm cortical hemorrhagic left frontal lobe mass concerning for a metastasis with localized vasogenic edema.  Patient was admitted started on Decadron.  [facility] scan was completed at [facility] on 01/30/2020 visible  in our PACS system.  Patient has consented for bronchoscopy.  Patient has no questions today.  Eager to have his biopsy done and go home if at all possible.  BP 116/74 (BP Location: Right Arm)   Pulse 74   Temp 97.6 F (36.4 C) (Oral)   Resp 19   Wt (!) 138.7 kg   SpO2 99%   General: Obese male, elderly HEENT: NCAT, sclera clear tracking appropriately, trachea midline Heart: Regular rate rhythm, S1-S2 Lungs: Bilateral clear to auscultation, no crackles no wheeze Neuro: Able to move all 4 extremities, alert to person  CT scan of the chest: February 2021 lung mass, subcarinal adenopathy. The patient's images have been independently reviewed by me.    Assessment: Subcarinal right hilar mass concerning for a stage IV bronchogenic carcinoma with metastasis to the brain associated frontal lobe lesion with surrounding vasogenic edema Cytotoxic edema with confusion and encephalopathy secondary to  above Possible obstructive pneumonia on antibiotics currently Likely COPD  Plan: N.p.o. midnight Continue Decadron plus Keppra Plans for video bronchoscopy with likely endobronchial biopsies due to the way the CT scan looks however if no visible endobronchial lesion we will proceed with endobronchial ultrasound with transbronchial needle aspiration biopsies. Patient is agreeable to proceed.  I will call and update the patient's daughter per patient request.  Garner Nash, DO [facility] 02/03/2020 3:52 PM

## 2020-02-03 NOTE — Progress Notes (Addendum)
[facility]  PROGRESS NOTE  Shane Avila YIR:[id number] DOB: 1951/04/27 DOA: 01/29/2020 PCP: Bonnita Nasuti, MD   Brief HPI:   69 year old male with a history of atrial fibrillation, tobacco abuse, right lung mass initially seen on imaging on 12/03/2019.  Patient presented to [facility] for altered mental status and insomnia ongoing for about 3 weeks.  Found to have small area of hemorrhage on CT head.  Right lung mass was seen on CT chest.  He was noted to have changes consistent with postobstructive pneumonia on chest imaging.  He was started on antibiotics and transfer to [facility] for further evaluation with MRI and specialty care.  Per documentation patient had cardiac arrest during initial attempt to have biopsy and was subsequently scheduled to have biopsy done on the day of presentation to the hospital.  He was seen by neurology recommended Keppra and MRI with contrast when renal function allows.  Patient refused to complete MRI with contrast.    Subjective   Patient seen and examined, denies chest pain or shortness of breath.   Assessment/Plan:     1. Brain mass-MRI brain without contrast showed 1.4 cm cortically based hemorrhagic mass involving left anterior frontal lobe with mild localized vasogenic edema.  Concern for solitary metastasis versus primary CNS neoplasm.  EEG showed no seizure or epileptiform discharges.  MRI with and without contrast could not be completed due to patient refusal.  Patient started on dexamethasone 8 mg twice daily for 2 days followed by 4 mg twice daily.  Neurology recommended to call Dr. Benjamine Mola with neuro-oncology.  Called and discussed with Dr. Mickeal Skinner, who will see the patient today. 2. Encephalopathy-resolved, likely multifactorial in setting of infection, brain lesion.  Vitamin J00, folic acid low.  Replaced orally.  Continue Seroquel 25 mg bedtime.  Tramadol decreased as it decreases seizure threshold.   3. Right lung mass-suspicious  for lung cancer with history of tobacco abuse.  CT chest without contrast on 01/20/2020 showed redemonstration of large subcarinal mass difficult to fully characterize in absence of contrast but appears to be progressively narrowing the carina and proximal left mainstem bronchus as well as encasing and completely occluding the right mainstem bronchus and bronchus intermedius.  Progressive multifocal areas of airspace opacity throughout the right lung worrisome for postobstructive pneumonia.  Pulmonology was consulted, plan for video bronc with EBUS arrange for 02/14/2020. 4. Postobstructive pneumonia-CT chest on 02/05/2020 showed multifocal opacities concerning for postobstructive pneumonia.  Continue Zosyn, azithromycin. 5. Acute kidney injury-creatinine was 2.06 on admission, unclear baseline.  Renal ultrasound was consistent with medical renal disease.  Likely prerenal in setting of poor p.o. intake.  IV fluids started.  Creatinine has improved to 1.23. 6. Acute hypoxic respiratory failure-patient requiring 2 L/min of oxygen via nasal cannula.  Multifactorial in setting of lung mass and pneumonia. 7. Atrial fibrillation-patient is on nebivolol 5 mg daily, uptitrate as tolerated.  Patient takes nebivolol 20 mg daily at home.    SpO2: 99 % O2 Flow Rate (L/min): 2 L/min   COVID-19 Labs  Recent Labs    02/01/20 0518  FERRITIN 212    Lab Results  Component Value Date   SARSCOV2NAA NEGATIVE 02/13/2020     CBG: Recent Labs  Lab 02/03/20 0746  GLUCAP 123*    CBC: Recent Labs  Lab 02/02/2020 1727 02/01/20 0518 02/02/20 0755 02/03/20 0450  WBC 13.2* 11.4* 9.2 12.2*  HGB 10.8* 10.1* 10.5* 11.3*  HCT 35.0* 33.4* 34.9* 37.5*  MCV 84.1 85.0 83.9  86.4  PLT 278 274 265 299    Basic Metabolic Panel: Recent Labs  Lab 02/05/2020 1727 02/01/20 0518 02/01/20 1103 02/02/20 0755 02/03/20 0450  NA 135 137  --  140 140  K 3.9 3.6  --  4.1 3.9  CL 99 100  --  104 103  CO2 23 25  --  23 26   GLUCOSE 95 86  --  123* 131*  BUN 52* 42*  --  29* 30*  CREATININE 2.06* 1.45*  --  1.12 1.23  CALCIUM 9.4 9.1  --  9.7 9.8  MG  --   --  1.9  --   --      Liver Function Tests: Recent Labs  Lab 01/23/2020 1727 02/01/20 0518  AST 23 21  ALT 20 20  ALKPHOS 108 111  BILITOT 1.0 1.0  PROT 6.9 6.2*  ALBUMIN 2.2* 2.1*        DVT prophylaxis: SCDs  Code Status: Full code  Family Communication: No family at bedside  Disposition Plan: likely home when medically ready for discharge      Scheduled medications:  . arformoterol  15 mcg Nebulization BID  . atorvastatin  40 mg Oral QHS  . [START ON 01/22/2020] butamben-tetracaine-benzocaine  1 spray Topical Once  . Chlorhexidine Gluconate Cloth  6 each Topical Daily  . dexamethasone  4 mg Oral Q12H  . folic acid  1 mg Oral Daily  . levETIRAcetam  500 mg Oral BID  . [START ON 01/21/2020] lidocaine  1 application Topical Once  . mouth rinse  15 mL Mouth Rinse BID  . nebivolol  5 mg Oral Daily  . pantoprazole  40 mg Oral Daily  . sodium chloride flush  10-40 mL Intracatheter Q12H  . vitamin B-12  1,000 mcg Oral Daily    Consultants:  Neurology  Pulmonology  Procedures:    Antibiotics:   Anti-infectives (From admission, onward)   Start     Dose/Rate Route Frequency Ordered Stop   02/01/20 1800  piperacillin-tazobactam (ZOSYN) IVPB 3.375 g     3.375 g 12.5 mL/hr over 240 Minutes Intravenous Every 8 hours 02/01/20 1048     02/01/20 1130  piperacillin-tazobactam (ZOSYN) IVPB 3.375 g     3.375 g 100 mL/hr over 30 Minutes Intravenous  Once 02/01/20 1048 02/01/20 1206   02/02/2020 1730  cefTRIAXone (ROCEPHIN) 1 g in sodium chloride 0.9 % 100 mL IVPB  Status:  Discontinued     1 g 200 mL/hr over 30 Minutes Intravenous Every 24 hours 02/03/2020 1721 02/01/20 1010   01/22/2020 1730  azithromycin (ZITHROMAX) 500 mg in sodium chloride 0.9 % 250 mL IVPB     500 mg 250 mL/hr over 60 Minutes Intravenous Every 24 hours 02/10/2020  1721         Objective   Vitals:   02/02/20 2249 02/03/20 0430 02/03/20 0439 02/03/20 1524  BP: 96/79  107/71 116/74  Pulse: 78  83 74  Resp: 18  18 19   Temp:  97.6 F (36.4 C)  97.6 F (36.4 C)  TempSrc:  Oral  Oral  SpO2: 99%  98% 99%  Weight:  (!) 138.7 kg      Intake/Output Summary (Last 24 hours) at 02/03/2020 1524 Last data filed at 02/03/2020 1050 Gross per 24 hour  Intake --  Output 485 ml  Net -485 ml    02/13 1901 - 02/15 0700 In: 495.5 [P.O.:120; I.V.:38.5] Out: 1335 [MEQAS:3419]  Filed Weights   02/01/20  0500 02/02/20 0320 02/03/20 0430  Weight: (!) 142.6 kg (!) 140.6 kg (!) 138.7 kg    Physical Examination:    General: Appears in no acute distress  Cardiovascular: S1-S2, regular, no murmur auscultated  Respiratory: Clear to auscultation bilaterally  Abdomen: Abdomen is soft, nontender, no organomegaly  Extremities: No edema in the lower extremities  Neurologic: Alert, oriented x3, no focal deficit noted    Data Reviewed:   Recent Results (from the past 240 hour(s))  Culture, blood (routine x 2)     Status: None (Preliminary result)   Collection Time: 01/23/2020  5:27 PM   Specimen: BLOOD LEFT HAND  Result Value Ref Range Status   Specimen Description BLOOD LEFT HAND  Final   Special Requests   Final    BOTTLES DRAWN AEROBIC ONLY Blood Culture adequate volume   Culture   Final    NO GROWTH 3 DAYS Performed at [facility], [address] [place], [place] [address]    Report Status PENDING  Incomplete  Culture, blood (routine x 2)     Status: None (Preliminary result)   Collection Time: 02/13/2020  5:27 PM   Specimen: BLOOD LEFT HAND  Result Value Ref Range Status   Specimen Description BLOOD LEFT HAND  Final   Special Requests   Final    BOTTLES DRAWN AEROBIC ONLY Blood Culture adequate volume   Culture   Final    NO GROWTH 3 DAYS Performed at [facility], [place] [address], [place], [place] [address]    Report Status  PENDING  Incomplete  SARS CORONAVIRUS 2 (TAT 6-24 HRS) Nasopharyngeal Nasopharyngeal Swab     Status: None   Collection Time: 02/03/2020  6:12 PM   Specimen: Nasopharyngeal Swab  Result Value Ref Range Status   SARS Coronavirus 2 NEGATIVE NEGATIVE Final    Comment: (NOTE) SARS-CoV-2 target nucleic acids are NOT DETECTED. The SARS-CoV-2 RNA is generally detectable in upper and lower respiratory specimens during the acute phase of infection. Negative results do not preclude SARS-CoV-2 infection, do not rule out co-infections with other pathogens, and should not be used as the sole basis for treatment or other patient management decisions. Negative results must be combined with clinical observations, patient history, and epidemiological information. The expected result is Negative. Fact Sheet for Patients: SugarRoll.be Fact Sheet for Healthcare Providers: https://www.woods-mathews.com/ This test is not yet approved or cleared by the [place] FDA and  has been authorized for detection and/or diagnosis of SARS-CoV-2 by FDA under an Emergency Use Authorization (EUA). This EUA will remain  in effect (meaning this test can be used) for the duration of the COVID-19 declaration under Section 56 4(b)(1) of the Act, 21 U.S.C. section 360bbb-3(b)(1), unless the authorization is terminated or revoked sooner. Performed at [facility], [place] [address], [place], [place] [address]       Admission status: Observation/Inpatient: Based on patients clinical presentation and evaluation of above clinical data, I have made determination that patient meets Inpatient criteria at this time.   Oswald Hillock   [facility] If 7PM-7AM, please contact night-coverage at [email or web address], Office  [phone]  password [email or web address]  02/03/2020, 3:24 PM  LOS: 3 days

## 2020-02-03 NOTE — Anesthesia Preprocedure Evaluation (Signed)
Anesthesia Evaluation  Patient identified by MRN, date of birth, ID band Patient awake    Reviewed: Allergy & Precautions, NPO status , Patient's Chart, lab work & pertinent test results, reviewed documented beta blocker date and time   Airway Mallampati: II  TM Distance: >3 FB Neck ROM: Full    Dental no notable dental hx.    Pulmonary pneumonia, unresolved, Current Smoker,  Hilar mass, postobstructive pneumonia (on zosyn, azithro)- likely lung ca with mets to brain  CT chest without contrast on 02/06/2020 showed redemonstration of large subcarinal mass difficult to fully characterize in absence of contrast but appears to be progressively narrowing the carina and proximal left mainstem bronchus as well as encasing and completely occluding the right mainstem bronchus and bronchus intermedius.   AHRF- on 2LPM [place] Hx COPD (110 pack year history)   Pulmonary exam normal breath sounds clear to auscultation       Cardiovascular hypertension, Pt. on medications and Pt. on home beta blockers Normal cardiovascular exam+ dysrhythmias Atrial Fibrillation  Rhythm:Regular Rate:Normal  HLD    Per family patient had cardiac arrest during initial attempt to have biopsy of hilar mass @ OSH and was subsequently scheduled to have biopsy re-done on the day of presentation to the hospital.    Neuro/Psych Presented with several weeks of progressive mental status changes, per family- found to have a progressive lung mass and post-obstructive pneumonia, in addition to hemorrhagic left frontal brain mass.  MRI with contrast never completed d/t patient refusal   On decadron for brain swelling, encephalopathy on presentation somewhat resolved now negative psych ROS   GI/Hepatic Neg liver ROS, GERD  Medicated and Controlled,  Endo/Other  Morbid obesity  Renal/GU Renal InsufficiencyRenal diseaseAKI on admission, Cr now down to 1.23 from 2  negative  genitourinary   Musculoskeletal negative musculoskeletal ROS (+)   Abdominal   Peds negative pediatric ROS (+)  Hematology negative hematology ROS (+)   Anesthesia Other Findings   Reproductive/Obstetrics negative OB ROS                             Anesthesia Physical Anesthesia Plan  ASA: IV  Anesthesia Plan: General   Post-op Pain Management:    Induction: Intravenous  PONV Risk Score and Plan: 1 and Ondansetron and Treatment may vary due to age or medical condition  Airway Management Planned: Oral ETT  Additional Equipment: None  Intra-op Plan:   Post-operative Plan: Extubation in OR  Informed Consent: I have reviewed the patients History and Physical, chart, labs and discussed the procedure including the risks, benefits and alternatives for the proposed anesthesia with the patient or authorized representative who has indicated his/her understanding and acceptance.     Dental advisory given  Plan Discussed with: CRNA  Anesthesia Plan Comments:         Anesthesia Quick Evaluation

## 2020-02-03 NOTE — H&P (View-Only) (Signed)
NAME:  Shane Avila, MRN:  [id number], DOB:  1951-01-05, LOS: 3 ADMISSION DATE:  01/23/2020, CONSULTATION DATE: 02/01/2020 REFERRING MD: Dr. Tyrell Antonio, CHIEF COMPLAINT: Subcarinal mass  Brief History   69 year old former smoker (110 pack years) with a history of COPD and chronic hypoxemic respiratory failure on 2 L/min.  Also with hypertension, hyperlipidemia, atrial fibrillation and depression.  He apparently is undergoing work-up for a subcarinal, right hilar mass that was first discovered on CT chest 11/13/2019 at [facility].  Biopsy was planned but has not yet been done. He was evaluated in the ED at [facility] 2/11 for confusion and "bizarre behavior".  CT head revealed a left frontal small hyperdense area, question SAH versus mass.  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  His other work-up revealed acute renal failure (improving).  Past Medical History  COPD Chronic hypoxemic respiratory failure Hypertension Hyperlipidemia Atrial fibrillation Depression COVID-19 pneumonia in October 2020, did not require hospital admission  Significant Hospital Events   2/12 Admit   Consults:  Neurology PCCM  Procedures:    Significant Diagnostic Tests:  CT chest Oval Linsey) 01/30/2020 >> 5.1 x 4.8 cm subcarinal mass that impacts the main carina and extrinsically compresses the right mainstem bronchus, right upper lobe airway and bronchus intermedius.  Unclear whether there is an endobronchial lesion.  Significant right lower lobe volume loss/atelectasis/collapse vs post-obstructive PNA.  Small right effusion MRI Brain w/o 2/12 >> approximate 72mm cortically based hemorrhagic mass inovlving the anterior left frontal lobe, mild vasogenic edema w/o significant regional mass effect MRI Brain w/o 2/13 >> motion degraded exam, unchanged 14 mm cortically based hemorrhagic mass   Micro Data:   SARSCov2 2/12 >> negative Blood 2/12 >>  Respiratory 2/12 >>  Antimicrobials:  Ceftriaxone 2/12 x1 Azithromycin 2/12 >>  Zosyn 2/13 >>   Interim history/subjective:  Pt reports he is aware of mass in lung / brain.  States he is willing to do whatever he needs for appropriate work up.   Afebrile  Objective   Blood pressure 107/71, pulse 83, temperature 97.6 F (36.4 C), temperature source Oral, resp. rate 18, weight (!) 138.7 kg, SpO2 98 %.        Intake/Output Summary (Last 24 hours) at 02/03/2020 1117 Last data filed at 02/03/2020 1050 Gross per 24 hour  Intake --  Output 485 ml  Net -485 ml   Filed Weights   02/01/20 0500 02/02/20 0320 02/03/20 0430  Weight: (!) 142.6 kg (!) 140.6 kg (!) 138.7 kg    Examination: General: adult male lying in bed in NAD HEENT: MM pink/dry, poor dentition, anicteric  Neuro: awake, alert, oriented to self, place, time / events.  MAE.  States he is willing to have procedures to work up malignant concerns. Tearful when talking about his wife.  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 -case reviewed with Neurosurgery, given no contrasted images and inability to determine actual size of mass vs contribution of hemorrhage.  Recommendations would be for contrasted imaging now > if large component of hemorrhage, would wait several weeks  to allow hemorrhage to clear and then consider resection.  Dr. Nickolas Madrid ([place]) made aware of case as well to follow peripherally / for Tumor Board review, appreciate assistance. -will proceed with video bronch with EBUS arranged for 0730 2/26  -consider  initial FOB evaluation first for endobronchial lesion, then advance to EBUS if no endobronchial lesion present  -NPO after MN 2/16 -Per prior documentation, family are in agreement for biopsy, appears he has had some confusion but is clear today.  Will need to call and discuss plan of care with family. -pre-bronch orders placed   Suspected postobstructive pneumonia -continue zosyn, azithro for now  COPD -Continue Brovana BID  -hold pulmicort while on dexamethasone  -Duoneb as needed  -consider adding spiriva respimat at discharge   Encephalopathy, confusion   Suspect largely due to his left frontal lobe lesion.  Consider also contribution of metabolic status, his renal insufficiency.  EEG 2/13 reassuring.   -Neurology following, appreciate input  -seizure precautions  -keppra 500 mg BID  -decadron   Acute renal failure, improving -Trend BMP / urinary output -Replace electrolytes as indicated -Avoid nephrotoxic agents, ensure adequate renal perfusion   Labs   CBC: Recent Labs  Lab 02/10/2020 1727 02/01/20 0518 02/02/20 0755 02/03/20 0450  WBC 13.2* 11.4* 9.2 12.2*  HGB 10.8* 10.1* 10.5* 11.3*  HCT 35.0* 33.4* 34.9* 37.5*  MCV 84.1 85.0 83.9 86.4  PLT 278 274 265 161    Basic Metabolic Panel: Recent Labs  Lab 02/10/2020 1727 02/01/20 0518 02/01/20 1103 02/02/20 0755 02/03/20 0450  NA 135 137  --  140 140  K 3.9 3.6  --  4.1 3.9  CL 99 100  --  104 103  CO2 23 25  --  23 26  GLUCOSE 95 86  --  123* 131*  BUN 52* 42*  --  29* 30*  CREATININE 2.06* 1.45*  --  1.12 1.23  CALCIUM 9.4 9.1  --  9.7 9.8  MG  --   --  1.9  --   --    GFR: CrCl cannot be calculated (Unknown ideal weight.). Recent Labs  Lab 01/26/2020 1727 02/01/20 0518 02/02/20 0755 02/03/20 0450  WBC 13.2* 11.4* 9.2 12.2*    Liver Function Tests: Recent Labs  Lab 01/30/2020 1727 02/01/20 0518  AST 23 21  ALT 20 20  ALKPHOS 108 111  BILITOT 1.0 1.0  PROT 6.9 6.2*  ALBUMIN 2.2* 2.1*   No  results for input(s): LIPASE, AMYLASE in the last 168 hours. No results for input(s): AMMONIA in the last 168 hours.  ABG No results found for: PHART, PCO2ART, PO2ART, HCO3, TCO2, ACIDBASEDEF, O2SAT   Coagulation Profile: Recent Labs  Lab 02/01/20 0518  INR 1.3*    Cardiac Enzymes: No results for input(s): CKTOTAL, CKMB, CKMBINDEX, TROPONINI in the last 168 hours.  HbA1C: No results found for: HGBA1C  CBG: Recent Labs  Lab 02/03/20 0746  GLUCAP 123*     Critical care time: n/a.      Noe Gens, MSN, NP-C [facility] 02/03/2020, 11:17 AM  Please see [email or web address] for pager details.    PCCM attending:  This is a 69 year old gentleman longstanding history of tobacco abuse found to have a lung mass.  MRI of the brain revealed a 14 mm cortical hemorrhagic left frontal lobe mass concerning for a metastasis with localized vasogenic edema.  Patient was admitted started on Decadron.  [facility] scan was [facility] at [facility] on 01/30/2020 visible  in our PACS system.  Patient has consented for bronchoscopy.  Patient has no questions today.  Eager to have his biopsy done and go home if at all possible.  BP 116/74 (BP Location: Right Arm)   Pulse 74   Temp 97.6 F (36.4 C) (Oral)   Resp 19   Wt (!) 138.7 kg   SpO2 99%   General: Obese male, elderly HEENT: NCAT, sclera clear tracking appropriately, trachea midline Heart: Regular rate rhythm, S1-S2 Lungs: Bilateral clear to auscultation, no crackles no wheeze Neuro: Able to move all 4 extremities, alert to person  CT scan of the chest: February 2021 lung mass, subcarinal adenopathy. The patient's images have been independently reviewed by me.    Assessment: Subcarinal right hilar mass concerning for a stage IV bronchogenic carcinoma with metastasis to the brain associated frontal lobe lesion with surrounding vasogenic edema Cytotoxic edema with confusion and encephalopathy secondary to  above Possible obstructive pneumonia on antibiotics currently Likely COPD  Plan: N.p.o. midnight Continue Decadron plus Keppra Plans for video bronchoscopy with likely endobronchial biopsies due to the way the CT scan looks however if no visible endobronchial lesion we will proceed with endobronchial ultrasound with transbronchial needle aspiration biopsies. Patient is agreeable to proceed.  I will call and update the patient's daughter per patient request.  Garner Nash, DO [facility] 02/03/2020 3:52 PM

## 2020-02-03 NOTE — Consult Note (Signed)
[facility] Consult Note  Patient Care Team: Hague, Rosalyn Charters, MD as PCP - General (Internal Medicine)  CHIEF COMPLAINTS/PURPOSE OF CONSULTATION:  Confusion Brain Mass  HISTORY OF PRESENTING ILLNESS:  Shane Avila 69 y.o. male presented with several weeks of progressive mental status changes, per family.  He was found to have a progressive lung mass and post-obstructive pneumonia, in addition to hemorrhagic left frontal brain mass.  He denies weakness, numbness, aphasia, headaches, seizures.  Main complaints at this time are respiratory.  At home is non-ambulatory due to arthritis.  MEDICAL HISTORY:  No past medical history on file.  SURGICAL HISTORY:   SOCIAL HISTORY: Social History   Socioeconomic History  . Marital status: Married    Spouse name: Not on file  . Number of children: Not on file  . Years of education: Not on file  . Highest education level: Not on file  Occupational History  . Not on file  Tobacco Use  . Smoking status: Not on file  Substance and Sexual Activity  . Alcohol use: Not on file  . Drug use: Not on file  . Sexual activity: Not on file  Other Topics Concern  . Not on file  Social History Narrative  . Not on file   Social Determinants of Health   Financial Resource Strain:   . Difficulty of Paying Living Expenses: Not on file  Food Insecurity:   . Worried About Charity fundraiser in the Last Year: Not on file  . Ran Out of Food in the Last Year: Not on file  Transportation Needs:   . Lack of Transportation (Medical): Not on file  . Lack of Transportation (Non-Medical): Not on file  Physical Activity:   . Days of Exercise per Week: Not on file  . Minutes of Exercise per Session: Not on file  Stress:   . Feeling of Stress : Not on file  Social Connections:   . Frequency of Communication with Friends and Family: Not on file  . Frequency of Social Gatherings with Friends and Family: Not on file  . Attends  Religious Services: Not on file  . Active Member of Clubs or Organizations: Not on file  . Attends Archivist Meetings: Not on file  . Marital Status: Not on file  Intimate Partner Violence:   . Fear of Current or Ex-Partner: Not on file  . Emotionally Abused: Not on file  . Physically Abused: Not on file  . Sexually Abused: Not on file    FAMILY HISTORY: History reviewed. No pertinent family history.  ALLERGIES:  is allergic to codeine and xanax [alprazolam].  MEDICATIONS:  Current Facility-Administered Medications  Medication Dose Route Frequency Provider Last Rate Last Admin  . acetaminophen (TYLENOL) tablet 650 mg  650 mg Oral Q6H PRN Regalado, Belkys A, MD   650 mg at 02/02/20 0216   Or  . acetaminophen (TYLENOL) suppository 650 mg  650 mg Rectal Q6H PRN Regalado, Belkys A, MD      . arformoterol (BROVANA) nebulizer solution 15 mcg  15 mcg Nebulization BID Collene Gobble, MD      . atorvastatin (LIPITOR) tablet 40 mg  40 mg Oral QHS Lucky Cowboy, MD   40 mg at 02/02/20 2232  . azithromycin (ZITHROMAX) 500 mg in sodium chloride 0.9 % 250 mL IVPB  500 mg Intravenous Q24H Regalado, Belkys A, MD 250 mL/hr at 02/02/20 1705 500 mg at 02/02/20 1705  . benzonatate (TESSALON) capsule  200 mg  200 mg Oral TID PRN Lovey Newcomer T, NP   200 mg at 02/02/20 0216  . [START ON 02/02/2020] butamben-tetracaine-benzocaine (CETACAINE) spray 1 spray  1 spray Topical Once Ollis, Brandi L, NP      . Chlorhexidine Gluconate Cloth 2 % PADS 6 each  6 each Topical Daily Regalado, Belkys A, MD   6 each at 02/02/20 0854  . dexamethasone (DECADRON) tablet 4 mg  4 mg Oral Q12H Lucky Cowboy, MD   4 mg at 02/03/20 1000  . folic acid (FOLVITE) tablet 1 mg  1 mg Oral Daily Lucky Cowboy, MD   1 mg at 02/03/20 1000  . ipratropium-albuterol (DUONEB) 0.5-2.5 (3) MG/3ML nebulizer solution 3 mL  3 mL Nebulization Q6H PRN Regalado, Belkys A, MD      . levETIRAcetam (KEPPRA) tablet 500 mg  500 mg Oral BID  Aroor, Karena Addison R, MD   500 mg at 02/03/20 1000  . [START ON 01/30/2020] lidocaine (XYLOCAINE) 2 % jelly 1 application  1 application Topical Once Ollis, Brandi L, NP      . MEDLINE mouth rinse  15 mL Mouth Rinse BID Regalado, Belkys A, MD   15 mL at 02/02/20 2233  . nebivolol (BYSTOLIC) tablet 5 mg  5 mg Oral Daily Lucky Cowboy, MD   5 mg at 02/03/20 1000  . ondansetron (ZOFRAN) tablet 4 mg  4 mg Oral Q6H PRN Regalado, Belkys A, MD       Or  . ondansetron (ZOFRAN) injection 4 mg  4 mg Intravenous Q6H PRN Regalado, Belkys A, MD      . pantoprazole (PROTONIX) EC tablet 40 mg  40 mg Oral Daily Lucky Cowboy, MD   40 mg at 02/03/20 1000  . [START ON 01/30/2020] phenylephrine (NEO-SYNEPHRINE) 0.25 % nasal spray 1 spray  1 spray Each Nare Q6H PRN Ollis, Brandi L, NP      . piperacillin-tazobactam (ZOSYN) IVPB 3.375 g  3.375 g Intravenous Q8H Kris Mouton, RPH 12.5 mL/hr at 02/03/20 1003 3.375 g at 02/03/20 1003  . vitamin B-12 (CYANOCOBALAMIN) tablet 1,000 mcg  1,000 mcg Oral Daily Lucky Cowboy, MD   1,000 mcg at 02/03/20 1000    REVIEW OF SYSTEMS:   Constitutional: Denies fevers, chills or abnormal weight loss Eyes: Denies blurriness of vision Ears, nose, mouth, throat, and face: Denies mucositis or sore throat Respiratory: + cough, +dyspnea Cardiovascular: Denies palpitation, chest discomfort or lower extremity swelling Gastrointestinal:  Denies nausea, constipation, diarrhea GU: Denies dysuria or incontinence Skin: Denies abnormal skin rashes Neurological: Per HPI Musculoskeletal: chronic arthritis pain Behavioral/Psych: Denies anxiety, disturbance in thought content, and mood instability   PHYSICAL EXAMINATION: Vitals:   02/03/20 0439 02/03/20 1524  BP: 107/71 116/74  Pulse: 83 74  Resp: 18 19  Temp:  97.6 F (36.4 C)  SpO2: 98% 99%   KPS: 60. General: Alert, cooperative, pleasant, in no acute distress Head: Normal EENT: No conjunctival injection or scleral icterus. Oral  mucosa moist Lungs: Resp effort increased Cardiac: irregular rhythm Abdomen: distended abdomen Skin: No rashes cyanosis or petechiae. Extremities: No clubbing or edema  NEUROLOGIC EXAM: Mental Status: Awake, alert, attentive to examiner. Oriented to self and environment. Language is fluent with intact comprehension.  Age advanced psychomotor slowing Cranial Nerves: Visual acuity is grossly normal. Visual fields are full. Extra-ocular movements intact. No ptosis. Face is symmetric, tongue midline. Motor: Tone and bulk are normal. Power is full in both arms and legs.  Reflexes are symmetric, no pathologic reflexes present. Intact finger to nose bilaterally Sensory: Intact to light touch and temperature Gait: Deferred   LABORATORY DATA:  I have reviewed the data as listed Lab Results  Component Value Date   WBC 12.2 (H) 02/03/2020   HGB 11.3 (L) 02/03/2020   HCT 37.5 (L) 02/03/2020   MCV 86.4 02/03/2020   PLT 327 02/03/2020   Recent Labs    02/10/2020 1727 02/01/2020 1727 02/01/20 0518 02/02/20 0755 02/03/20 0450  NA 135   < > 137 140 140  K 3.9   < > 3.6 4.1 3.9  CL 99   < > 100 104 103  CO2 23   < > 25 23 26   GLUCOSE 95   < > 86 123* 131*  BUN 52*   < > 42* 29* 30*  CREATININE 2.06*   < > 1.45* 1.12 1.23  CALCIUM 9.4   < > 9.1 9.7 9.8  GFRNONAA 33*   < > 50* >60 >60  GFRAA 38*   < > 58* >60 >60  PROT 6.9  --  6.2*  --   --   ALBUMIN 2.2*  --  2.1*  --   --   AST 23  --  21  --   --   ALT 20  --  20  --   --   ALKPHOS 108  --  111  --   --   BILITOT 1.0  --  1.0  --   --    < > = values in this interval not displayed.    RADIOGRAPHIC STUDIES: I have personally reviewed the radiological images as listed and agreed with the findings in the report. EEG  Result Date: 02/01/2020 Lora Havens, MD     02/01/2020 10:08 AM Patient Name: Shane Avila MRN: [id number] Epilepsy Attending: Lora Havens Referring Physician/Provider: Dr Amie [place] Date: 02/01/2020  Duration: 22.41 mins Patient history: 69yo M with ams. MRI brain showed possible hemorrhagic metastatic lesion in the left frontal lobe which is cortically based. EEG to evaluate for seizure. Level of alertness: awake AEDs during EEG study: keppra Technical aspects: This EEG study was done with scalp electrodes positioned according to the 10-20 International system of electrode placement. Electrical activity was acquired at a sampling rate of 500Hz  and reviewed with a high frequency filter of 70Hz  and a low frequency filter of 1Hz . EEG data were recorded continuously and digitally stored. DESCRIPTION: The posterior dominant rhythm consists of 8-9Hz  activity of moderate voltage (25-35 uV) seen predominantly in posterior head regions, symmetric and reactive to eye opening and eye closing.  Photic driving was seen in photic stimulation. Hyperventilation was not performed. IMPRESSION: This study is within normal limits. No seizures or epileptiform discharges were seen throughout the recording. However, only wakefulness was recorded. If suspicion for interictal activity remains a concern, a prolonged study including sleep should be considered. Lora Havens   MR BRAIN WO CONTRAST  Result Date: 02/01/2020 CLINICAL DATA:  Brain mass or lesion. Additional history obtained from [place] of lung mass EXAM: MRI HEAD WITHOUT CONTRAST TECHNIQUE: Multiplanar, multiecho pulse sequences of the brain and surrounding structures were obtained without intravenous contrast. COMPARISON:  Brain MRI 02/06/2020 FINDINGS: Brain: The examination was prematurely terminated at the patient's request. Only axial and coronal diffusion-weighted imaging, sagittal T1 weighted imaging, axial T2 weighted imaging, axial T2 FLAIR imaging, axial T2* imaging and axial T1 FLAIR imaging was obtained. Postcontrast imaging  could not be obtained. The acquired sequences are motion degraded. Most notably there is moderate motion  degradation of the sagittal T1 weighted sequence and moderate motion degradation of the axial T2 FLAIR sequence. Unchanged from prior examination 02/09/2020, there is a 13 x 14 mm cortically based mass within the anterior left frontal lobe with associated susceptibility artifact compatible with blood products. Unchanged small amount of associated edema at this site. There is no effacement of the ventricular system or midline shift. No evidence of acute infarct. Background mild scattered T2/FLAIR hyperintensity within the cerebral white matter is nonspecific, but consistent with chronic small vessel ischemic disease. Mild generalized parenchymal atrophy. There is no extra-axial fluid collection. Vascular: Flow voids maintained within the proximal large arterial vessels. Skull and upper cervical spine: Within the limitations of motion degradation, no focal marrow lesion is identified. Sinuses/Orbits: Visualized orbits demonstrate no acute abnormality. Mild ethmoid sinus mucosal thickening. Redemonstrated right mastoid effusion. IMPRESSION: Incomplete and motion degraded examination as described. The examination was prematurely terminated at the patient's request, prior to post-contrast imaging. Unchanged 14 mm cortically based hemorrhagic mass involving the anterior left frontal lobe with associated mild edema. Findings are again indeterminate, but most concerning for a metastasis. Primary CNS neoplasm is also a consideration. Postcontrast imaging is recommended when the patient is better able to tolerate the study. Mild generalized parenchymal atrophy and chronic small vessel ischemic disease. Right mastoid effusion. Electronically Signed   By: Kellie Simmering DO   On: 02/01/2020 14:30   MR BRAIN WO CONTRAST  Result Date: 02/03/2020 CLINICAL DATA:  Follow-up examination for subarachnoid hemorrhage. History of right lung mass, smoking. EXAM: MRI HEAD WITHOUT CONTRAST TECHNIQUE: Multiplanar, multiecho pulse sequences  of the brain and surrounding structures were obtained without intravenous contrast. COMPARISON:  Prior head CT from 01/30/2020. FINDINGS: Brain: Examination moderately degraded by motion artifact. Diffuse prominence of the CSF containing spaces compatible with generalized age-related cerebral atrophy. Patchy T2/FLAIR hyperintensity within the periventricular and deep white matter both cerebral hemispheres most consistent with chronic small vessel ischemic disease, mild to moderate in nature. No abnormal foci of restricted diffusion to suggest acute or subacute ischemia. No encephalomalacia to suggest chronic cortical infarction. There is an approximate 13 x 14 mm cortically based mass positioned at the anterior left frontal lobe, corresponding with abnormality seen on prior CT (series 15, image 17 associated susceptibility artifact compatible with blood products, also seen on prior CT. No other definite discrete masses identified on this noncontrast examination. No midline shift. No hydrocephalus. No extra-axial fluid collection. Pituitary gland suprasellar region normal. Midline structures intact.). Exact measurements somewhat difficult given lack of IV contrast and motion artifact. Small amount of localized vasogenic edema without significant regional mass effect. Vascular: Major intracranial vascular flow voids are maintained. Skull and upper cervical spine: Craniocervical junction within normal limits. Visualized upper cervical spine grossly unremarkable. Bone marrow signal intensity within normal limits. No focal marrow replacing lesion. Scalp soft tissues unremarkable. Sinuses/Orbits: Patient status post ocular lens replacement on the left. Globes and orbital soft tissues demonstrate no acute finding. Paranasal sinuses are clear. Other: Right mastoid and middle ear effusion noted, of uncertain significance. Visualized nasopharynx grossly unremarkable. IMPRESSION: 1. Approximate 14 mm cortically based  hemorrhagic mass involving the anterior left frontal lobe, corresponding with abnormality seen on prior CT. Mild localized vasogenic edema without significant regional mass effect. Finding is indeterminate, but most concerning for a possible solitary intracranial metastasis given the patient's pulmonary history. Primary CNS neoplasm could also be  considered. Further assessment with postcontrast imaging recommended for complete evaluation. 2. Underlying mild age-related cerebral atrophy with mild-to-moderate chronic microvascular ischemic disease. Electronically Signed   By: Jeannine Boga M.D.   On: 02/06/2020 19:36   US RENAL  Result Date: 02/09/2020 CLINICAL DATA:  Acute renal insufficiency, renal cysts, renal calculi EXAM: RENAL / URINARY TRACT ULTRASOUND COMPLETE COMPARISON:  01/30/2020 FINDINGS: Right Kidney: Renal measurements: 17.1 x 10.1 x 7.7 cm = volume: 696 mL. Numerous right renal cysts are identified measuring up to 8.9 cm, unchanged since recent CT. There is increased renal cortical echotexture. Left Kidney: Renal measurements: 16.9 x 8.4 by 7.1 cm = volume: 524 mL. Multiple left renal cysts are identified largest measuring 6.5 cm. Mild increased renal cortical echotexture. Bladder: The bladder is decompressed. Other: None. IMPRESSION: 1. Numerous bilateral renal cortical cysts. 2. Increased renal cortical echotexture consistent with medical renal disease. Electronically Signed   By: Randa Ngo M.D.   On: 01/22/2020 23:27   DG CHEST PORT 1 VIEW  Result Date: 02/08/2020 CLINICAL DATA:  Dyspnea EXAM: PORTABLE CHEST 1 VIEW COMPARISON:  02/13/2020 8:38 a.m, chest CT 02/08/2020. FINDINGS: Single frontal view of the chest demonstrates stable right internal jugular catheter tip overlying superior vena cava. The cardiac silhouette is enlarged but stable. There is persistent but improving right basilar airspace disease. No effusion or pneumothorax. IMPRESSION: 1. Persistent but improving right  basilar airspace disease, most compatible with postobstructive bronchopneumonia. Please refer to prior chest CT 01/22/2020 describing a central obstructive right hilar mass. Electronically Signed   By: Randa Ngo M.D.   On: 02/11/2020 17:59    ASSESSMENT & PLAN:  Left Frontal Mass  Mr. 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  Clinical presentation, confusion are likely more related to infectious process and comorbidity.  First recommend obtaining contrast enhanced 3T MRI with SRS protocol.   Ok with Keppra 500mg  BID and Decadron 4mg  BID.  Can decrease decadron to 4mg  daily at discharge, with one week taper down to 2mg  daily.  Brain/Spine tumor board will discuss patient/case at next meeting 02/10/20.  Further intervention and workup will depend on bronchoscopy findings.  Brain tumor follow ups will be arranged by the cancer center team.  All questions were answered. The patient knows to call the clinic with any problems, questions or concerns.  The total time spent in the encounter was 55 minutes and more than 50% was on counseling and review of test results     Ventura Sellers, MD 02/03/2020 5:12 PM

## 2020-02-03 NOTE — Progress Notes (Signed)
Patient resting comfortably on 2L [place] with no respiratory distress noted. BIPAP not needed at this time. RT will monitor as needed. 

## 2020-02-03 NOTE — Progress Notes (Signed)
Central line removed from right IJ. Pressure dressing with vaseline gauze applied to site, manual pressure held for 3 minutes. Bleeding controlled, dressing secured with tape. Pt advised to stay supine in the bed for 30 minutes and not to get up during that time. Pt advised dressing is to stay in place for 24 hours. Pt gave verbal understanding of instruction. Advised pt to notify his nurse immediately if he noted any bleeding.

## 2020-02-04 ENCOUNTER — Inpatient Hospital Stay (HOSPITAL_COMMUNITY): Payer: Medicare HMO

## 2020-02-04 ENCOUNTER — Encounter (HOSPITAL_COMMUNITY): Payer: Self-pay | Admitting: Internal Medicine

## 2020-02-04 ENCOUNTER — Ambulatory Visit
Admit: 2020-02-04 | Discharge: 2020-02-04 | Disposition: A | Discharge status: Home / Self Care | Payer: Medicare HMO | Attending: Radiation Oncology | Admitting: Radiation Oncology

## 2020-02-04 ENCOUNTER — Encounter (HOSPITAL_COMMUNITY): Admission: AD | Disposition: E | Payer: Self-pay | Attending: Internal Medicine

## 2020-02-04 ENCOUNTER — Inpatient Hospital Stay (HOSPITAL_COMMUNITY): Payer: Medicare HMO | Admitting: Anesthesiology

## 2020-02-04 DIAGNOSIS — C3431 Malignant neoplasm of lower lobe, right bronchus or lung: Secondary | ICD-10-CM

## 2020-02-04 DIAGNOSIS — C7931 Secondary malignant neoplasm of brain: Secondary | ICD-10-CM

## 2020-02-04 HISTORY — PX: VIDEO BRONCHOSCOPY WITH ENDOBRONCHIAL ULTRASOUND: SHX6177

## 2020-02-04 LAB — CBC
HCT: 38.5 % — ABNORMAL LOW (ref 39.0–52.0)
Hemoglobin: 11.6 g/dL — ABNORMAL LOW (ref 13.0–17.0)
MCH: 25.7 pg — ABNORMAL LOW (ref 26.0–34.0)
MCHC: 30.1 g/dL (ref 30.0–36.0)
MCV: 85.4 fL (ref 80.0–100.0)
Platelets: 293 10*3/uL (ref 150–400)
RBC: 4.51 MIL/uL (ref 4.22–5.81)
RDW: 15.9 % — ABNORMAL HIGH (ref 11.5–15.5)
WBC: 13.2 10*3/uL — ABNORMAL HIGH (ref 4.0–10.5)
nRBC: 0 % (ref 0.0–0.2)

## 2020-02-04 LAB — BASIC METABOLIC PANEL
Anion gap: 13 (ref 5–15)
BUN: 28 mg/dL — ABNORMAL HIGH (ref 8–23)
CO2: 25 mmol/L (ref 22–32)
Calcium: 10 mg/dL (ref 8.9–10.3)
Chloride: 104 mmol/L (ref 98–111)
Creatinine, Ser: 1.1 mg/dL (ref 0.61–1.24)
GFR calc Af Amer: >60 mL/min (ref >60)
GFR calc non Af Amer: >60 mL/min (ref >60)
Glucose, Bld: 98 mg/dL (ref 70–99)
Potassium: 4 mmol/L (ref 3.5–5.1)
Sodium: 142 mmol/L (ref 135–145)

## 2020-02-04 IMAGING — DX DG CHEST 1V PORT
1 series · 1 of 1 positions shown · non-contrast
Comparison: View chest x-ray [DATE]

CLINICAL DATA: Central line placement.

EXAM:
PORTABLE CHEST 1 VIEW

[chest]
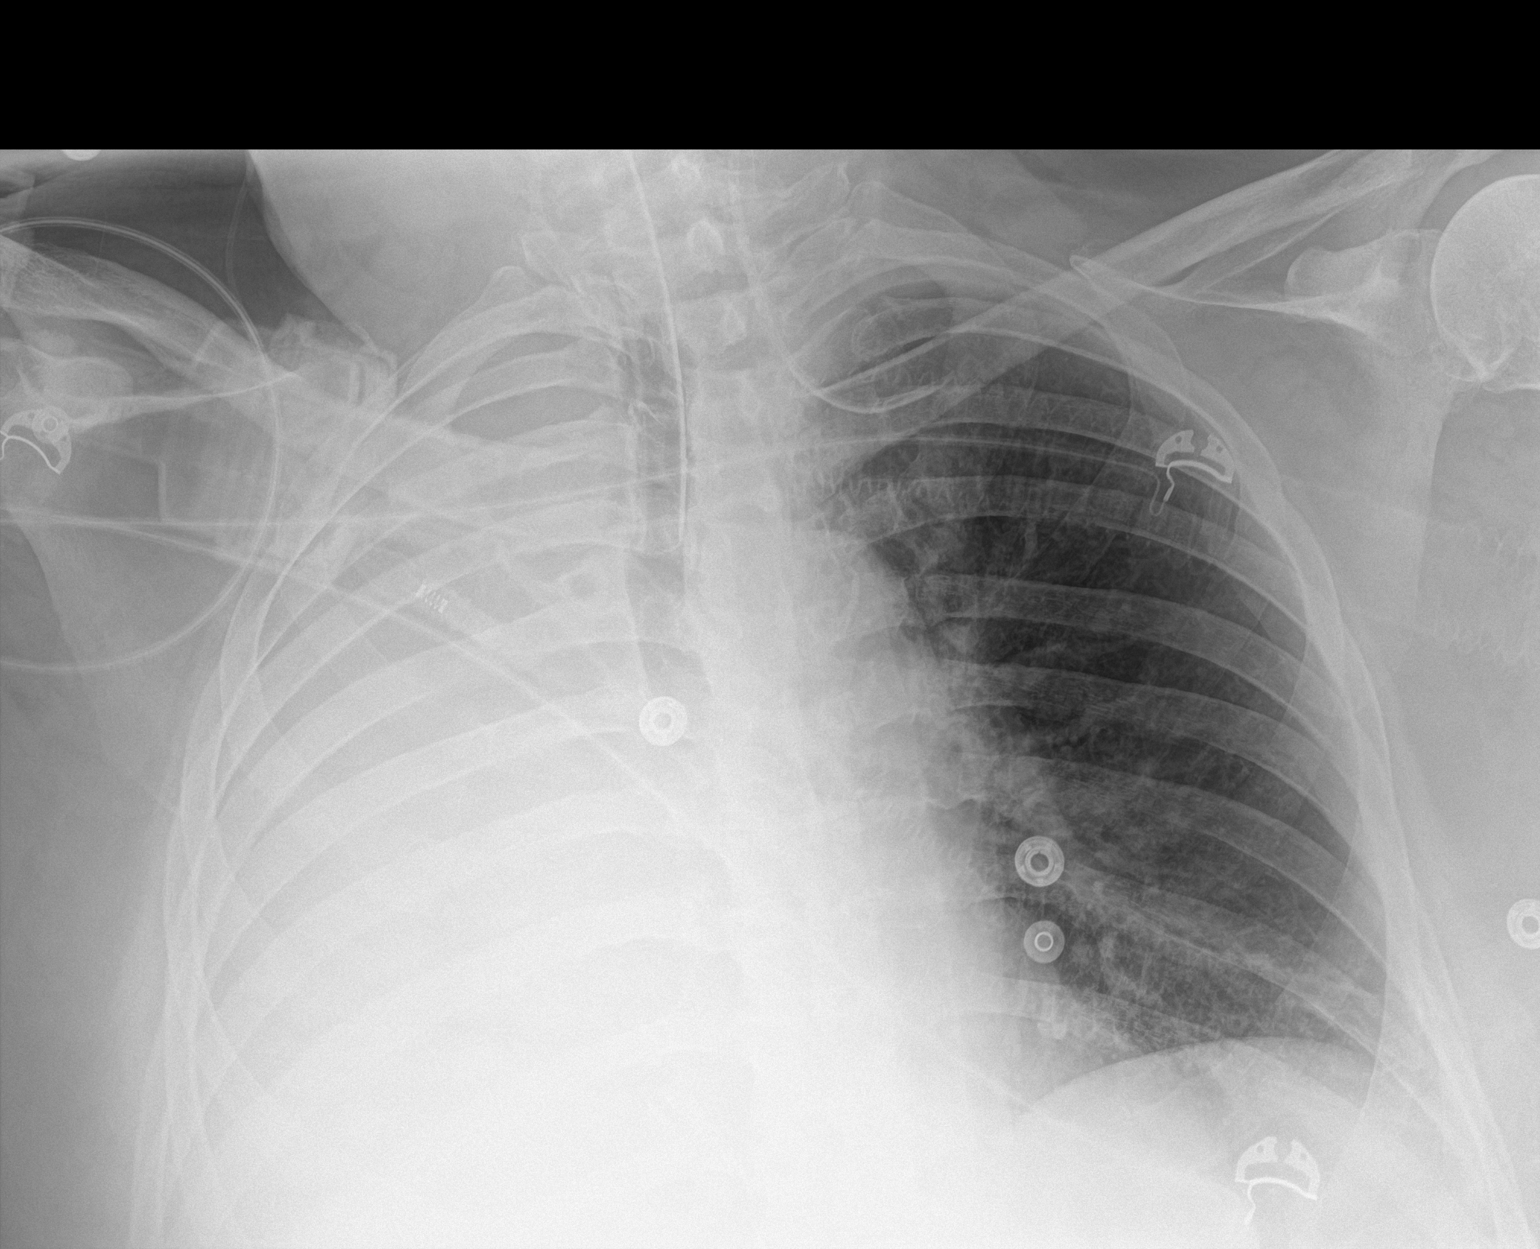

[1 of 1 positions shown; findings below may reference images not displayed]

FINDINGS: Endotracheal tube terminates at the level clavicles, well above the
carina. A left IJ line is been placed. The tip is in the left
subclavian. Previously seen right IJ line was removed. The right
hemithorax is now opacified. Mild pulmonary vascular congestion is
present on the left. Mild atelectasis is present at the left base
IMPRESSION: 1. Interval placement of left IJ line with the tip in the left
subclavian.
2. Interval opacification of the right hemithorax. This likely
represents a combination of effusion and atelectasis. Infection is
not excluded.
3. Intubation. The tip of the endotracheal tube is in satisfactory
position, at the level of the clavicles. Interval removal of right
IJ line.

These results were called by telephone at the time of interpretation
on [DATE] at [DATE] to provider ENKONO , who
verbally acknowledged these results.

## 2020-02-04 IMAGING — MR MR HEAD W/ CM
4 series · 48 of 48 positions shown · IV contrast (gadavist)
Comparison: Noncontrast MRI head [DATE] and earlier.

CLINICAL DATA: 68-year-old male with a left anterior frontal lobe
hemorrhagic mass on noncontrast brain MRI recently. Lung mass.

EXAM:
MRI HEAD WITH CONTRAST
TECHNIQUE: Multiplanar, multiecho pulse sequences of the brain and surrounding
structures were obtained with intravenous contrast.
CONTRAST:  10mL GADAVIST GADOBUTROL 1 MMOL/ML IV SOLN

[Series 5: T2 post-contrast · coronal · 5.0mm · 0.57mm/px · 6 of 32 slices shown]
[im 1/32]
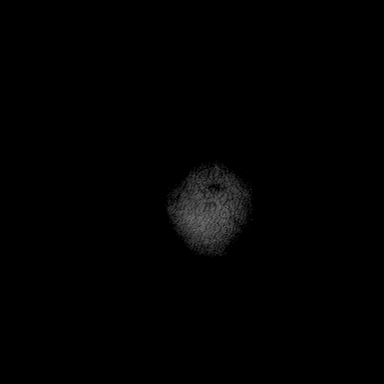
[im 7/32]
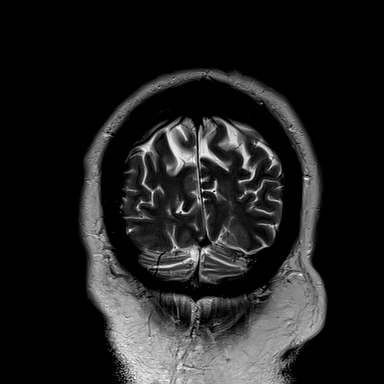
[im 13/32]
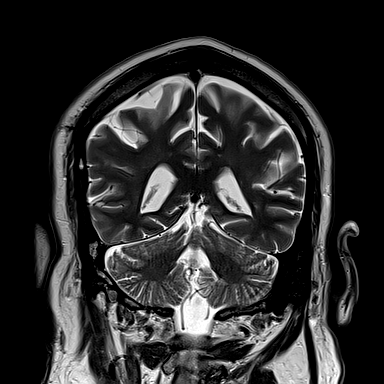
[im 19/32]
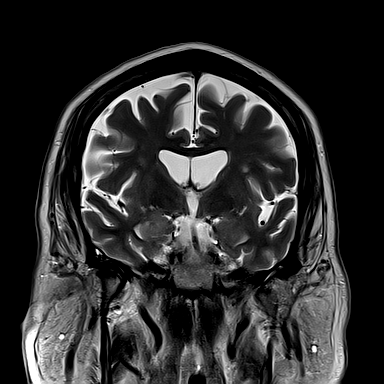
[im 25/32]
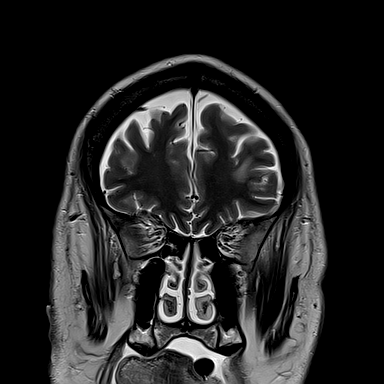
[im 32/32]
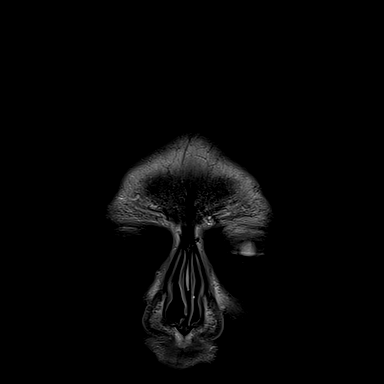

[Series 6: T1 post-contrast · axial · 1.0mm · 0.94mm/px · z∈[-69,+67]mm · 30 of 144 slices shown (1 of 3)]
[im 1/144]
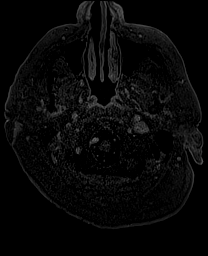
[im 5/144]
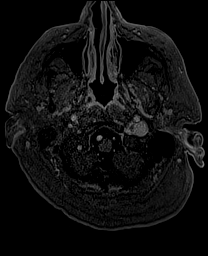
[im 10/144]
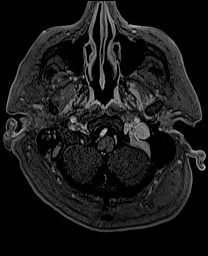
[im 15/144]
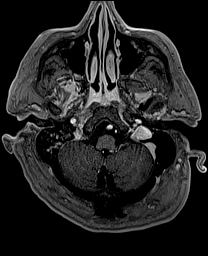
[im 20/144]
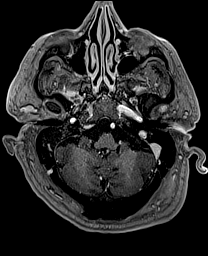
[im 25/144]
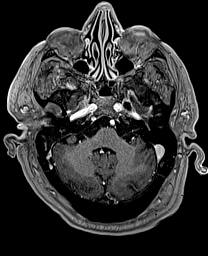
[im 30/144]
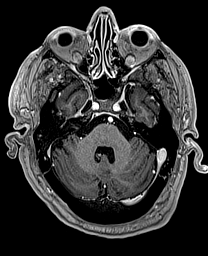
[im 35/144]
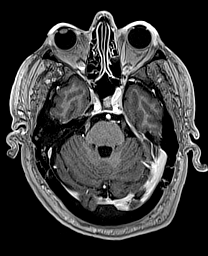
[im 40/144]
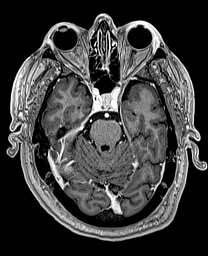
[im 45/144]
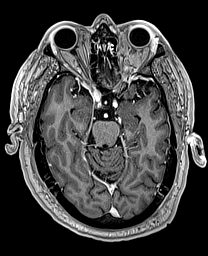
[im 50/144]
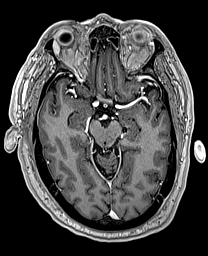
[im 55/144]
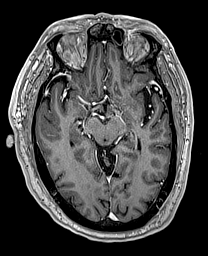
[im 60/144]
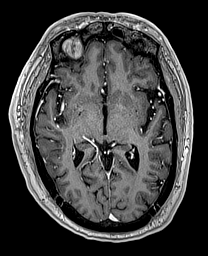
[im 65/144]
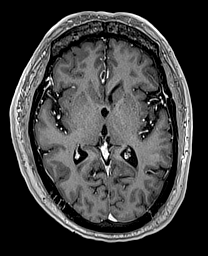
[im 70/144]
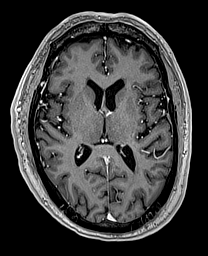
[im 74/144]
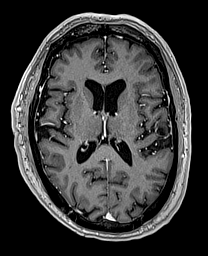
[im 79/144]
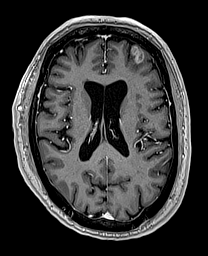
[im 84/144]
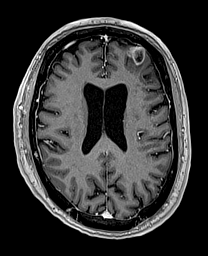
[im 89/144]
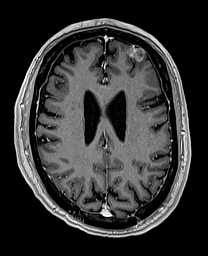
[im 94/144]
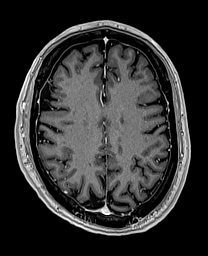
[im 99/144]
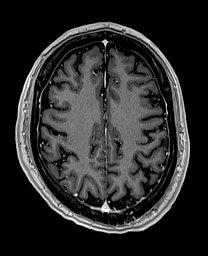
[im 104/144]
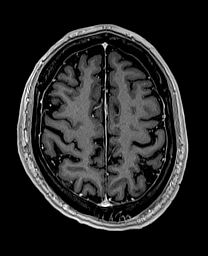
[im 109/144]
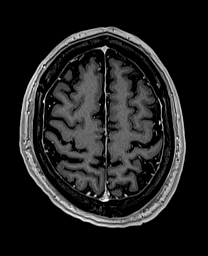
[im 114/144]
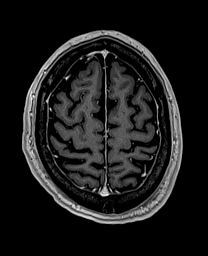
[im 119/144]
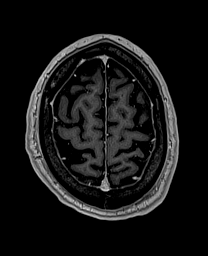
[im 124/144]
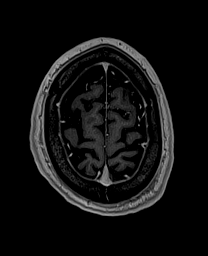
[im 129/144]
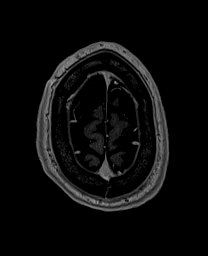
[im 134/144]
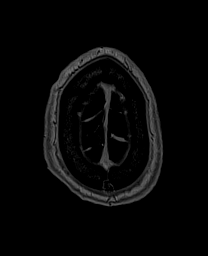
[im 139/144]
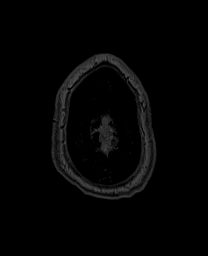
[im 144/144]
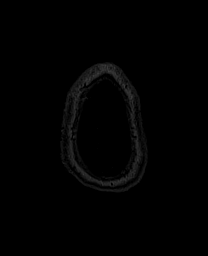

[Series 7: T1 post-contrast · coronal · 5.0mm · 0.43mm/px · 7 of 32 slices shown (2 of 3)]
[im 1/32]
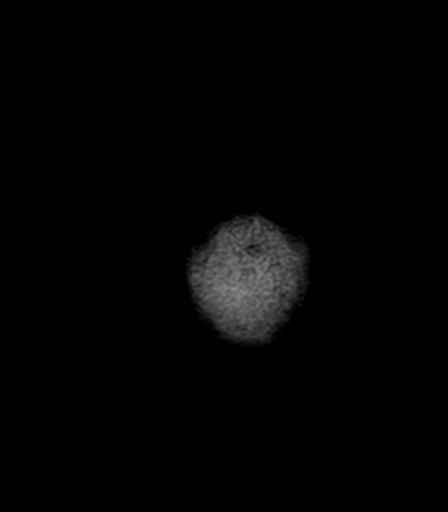
[im 6/32]
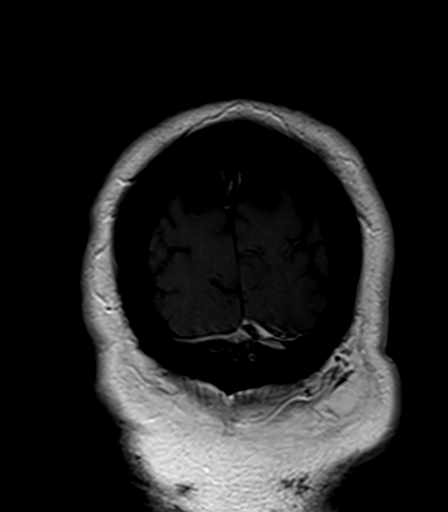
[im 11/32]
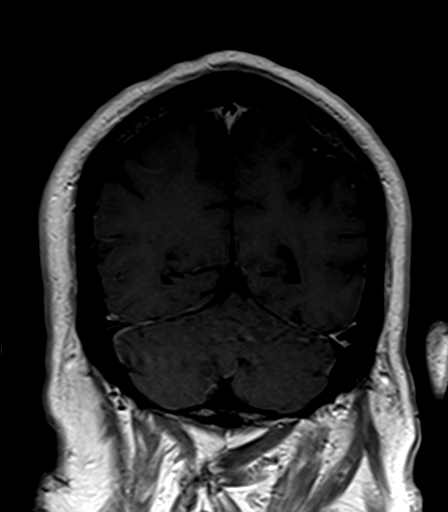
[im 16/32]
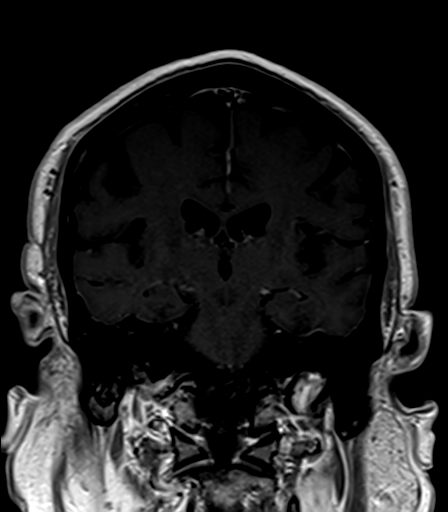
[im 21/32]
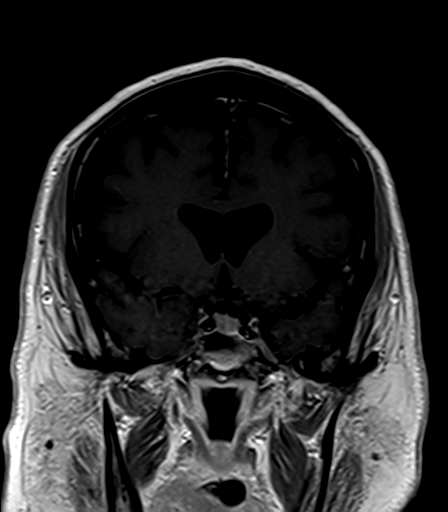
[im 26/32]
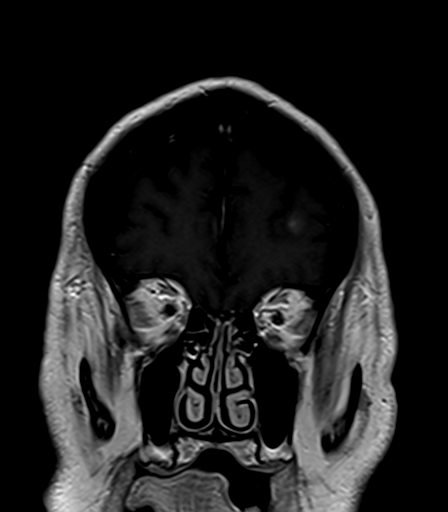
[im 32/32]
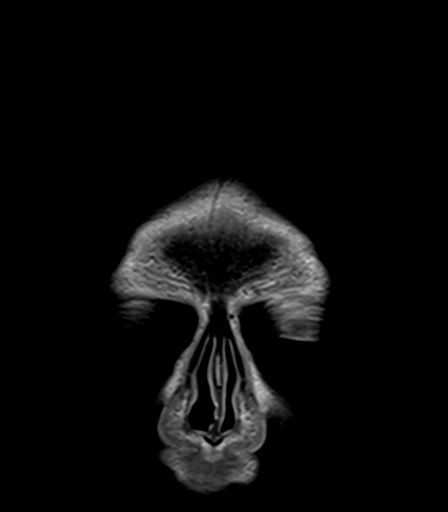

[Series 8: T1 post-contrast · sagittal · 5.0mm · 0.75mm/px · 5 of 24 slices shown (3 of 3)]
[im 1/24]
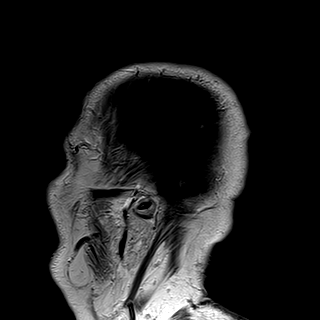
[im 6/24]
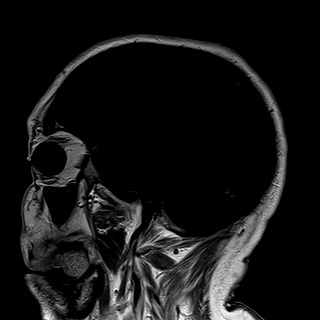
[im 12/24]
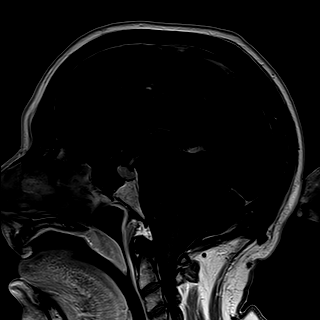
[im 18/24]
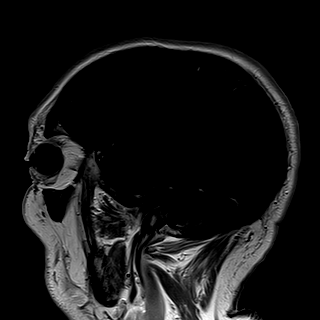
[im 24/24]
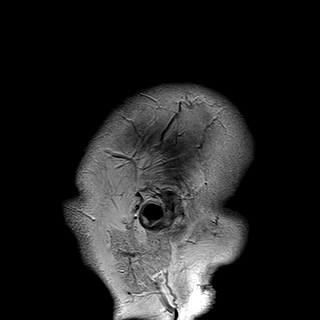

[48 of 48 positions shown; findings below may reference images not displayed]

FINDINGS: Post-contrast brain MRI imaging today.

The cortically based mildly lobulated anterior left frontal lobe
mass demonstrates irregular nodular and rim enhancement following
contrast and has lobulated margins measuring up to 20 mm diameter
(series 6, image 85, series 7, image 28 and series 8, image 17.

No other abnormal intracranial enhancement is identified. No dural
thickening. No midline shift or significant intracranial mass
effect.

The major dural venous sinuses are enhancing and appear to be
patent, the right transverse and sigmoid are non dominant and
diminutive.

Right mastoid fluid with no significant enhancement.
IMPRESSION: 1. Solitary lobulated and enhancing 20 mm mass of the anterior left
frontal lobe. A solitary metastasis is favored over primary brain
tumor.
2. No other abnormal intracranial enhancement or acute intracranial
abnormality identified.

## 2020-02-04 SURGERY — BRONCHOSCOPY, WITH EBUS
Anesthesia: General | Site: Bronchus

## 2020-02-04 MED ORDER — OXYCODONE HCL 5 MG PO TABS: 5.0000 mg | ORAL_TABLET | Freq: Once | ORAL | Status: DC | PRN

## 2020-02-04 MED ORDER — PROPOFOL 1000 MG/100ML IV EMUL
0.0000 ug/kg/min | INTRAVENOUS | Status: DC
  Administered 2020-02-04 (×2): 50 ug/kg/min via INTRAVENOUS
  Administered 2020-02-04: 20:00:00 20 ug/kg/min via INTRAVENOUS
  Administered 2020-02-04: 15:00:00 50 ug/kg/min via INTRAVENOUS
  Administered 2020-02-05: 18:00:00 20 ug/kg/min via INTRAVENOUS
  Administered 2020-02-05 (×2): 30 ug/kg/min via INTRAVENOUS
  Administered 2020-02-05: 23:00:00 40 ug/kg/min via INTRAVENOUS
  Administered 2020-02-05: 09:00:00 30 ug/kg/min via INTRAVENOUS
  Administered 2020-02-05: 01:00:00 20 ug/kg/min via INTRAVENOUS
  Administered 2020-02-06 (×5): 40 ug/kg/min via INTRAVENOUS
  Administered 2020-02-06 (×2): 30 ug/kg/min via INTRAVENOUS
  Administered 2020-02-06: 12:00:00 40 ug/kg/min via INTRAVENOUS
  Administered 2020-02-07: 10:00:00 30 ug/kg/min via INTRAVENOUS
  Administered 2020-02-07: 17:00:00 50 ug/kg/min via INTRAVENOUS
  Administered 2020-02-07: 12:00:00 40 ug/kg/min via INTRAVENOUS
  Administered 2020-02-07: 15:00:00 50 ug/kg/min via INTRAVENOUS
  Administered 2020-02-07: 40 ug/kg/min via INTRAVENOUS
  Administered 2020-02-07: 06:00:00 30 ug/kg/min via INTRAVENOUS
  Administered 2020-02-07 – 2020-02-08 (×5): 40 ug/kg/min via INTRAVENOUS
  Administered 2020-02-08: 30 ug/kg/min via INTRAVENOUS
  Administered 2020-02-08: 40 ug/kg/min via INTRAVENOUS
  Administered 2020-02-09: 25 ug/kg/min via INTRAVENOUS
  Administered 2020-02-09: 15 ug/kg/min via INTRAVENOUS
  Administered 2020-02-09: 16:00:00 20 ug/kg/min via INTRAVENOUS
  Administered 2020-02-09 (×2): 30 ug/kg/min via INTRAVENOUS
  Administered 2020-02-10 – 2020-02-12 (×7): 15 ug/kg/min via INTRAVENOUS
  Administered 2020-02-12: 14 ug/kg/min via INTRAVENOUS
  Administered 2020-02-13: 04:00:00 10 ug/kg/min via INTRAVENOUS
  Administered 2020-02-13: 15 ug/kg/min via INTRAVENOUS
  Administered 2020-02-13: 10 ug/kg/min via INTRAVENOUS
  Administered 2020-02-14 – 2020-02-15 (×5): 15 ug/kg/min via INTRAVENOUS
  Administered 2020-02-16 (×3): 30 ug/kg/min via INTRAVENOUS
  Filled 2020-02-04 (×28): qty 100
  Filled 2020-02-04: qty 200
  Filled 2020-02-04 (×22): qty 100
  Filled 2020-02-04: qty 200
  Filled 2020-02-04 (×3): qty 100

## 2020-02-04 MED ORDER — DILTIAZEM HCL-DEXTROSE 125-5 MG/125ML-% IV SOLN (PREMIX)
INTRAVENOUS | Status: DC | PRN
  Administered 2020-02-04: 5 mg/h via INTRAVENOUS

## 2020-02-04 MED ORDER — EPINEPHRINE PF 1 MG/ML IJ SOLN
INTRAMUSCULAR | Status: DC | PRN
  Administered 2020-02-04: 1 mg

## 2020-02-04 MED ORDER — PHENYLEPHRINE HCL-NACL 10-0.9 MG/250ML-% IV SOLN
0.0000 ug/min | INTRAVENOUS | Status: DC
  Administered 2020-02-04: 10:00:00 90 ug/min via INTRAVENOUS
  Administered 2020-02-04: 110 ug/min via INTRAVENOUS
  Administered 2020-02-04: 12:00:00 100 ug/min via INTRAVENOUS
  Administered 2020-02-04 (×2): 110 ug/min via INTRAVENOUS
  Administered 2020-02-05: 80 ug/min via INTRAVENOUS
  Administered 2020-02-05: 02:00:00 110 ug/min via INTRAVENOUS
  Administered 2020-02-05: 09:00:00 80 ug/min via INTRAVENOUS
  Administered 2020-02-05: 20:00:00 70 ug/min via INTRAVENOUS
  Administered 2020-02-05 (×2): 80 ug/min via INTRAVENOUS
  Administered 2020-02-05: 23:00:00 50 ug/min via INTRAVENOUS
  Administered 2020-02-05: 04:00:00 90 ug/min via INTRAVENOUS
  Administered 2020-02-05: 06:00:00 80 ug/min via INTRAVENOUS
  Administered 2020-02-05: 01:00:00 110 ug/min via INTRAVENOUS
  Administered 2020-02-06: 20 ug/min via INTRAVENOUS
  Administered 2020-02-06: 30 ug/min via INTRAVENOUS
  Administered 2020-02-09: 60 ug/min via INTRAVENOUS
  Administered 2020-02-09: 20 ug/min via INTRAVENOUS
  Administered 2020-02-10: 70 ug/min via INTRAVENOUS
  Administered 2020-02-10: 23:00:00 60 ug/min via INTRAVENOUS
  Administered 2020-02-10 (×4): 70 ug/min via INTRAVENOUS
  Administered 2020-02-11: 50 ug/min via INTRAVENOUS
  Filled 2020-02-04 (×37): qty 250

## 2020-02-04 MED ORDER — PHENYLEPHRINE HCL-NACL 10-0.9 MG/250ML-% IV SOLN: 25.0000 ug/min | INTRAVENOUS | Status: DC

## 2020-02-04 MED ORDER — GADOBUTROL 1 MMOL/ML IV SOLN
10.0000 mL | Freq: Once | INTRAVENOUS | Status: AC | PRN
  Administered 2020-02-04: 22:00:00 10 mL via INTRAVENOUS

## 2020-02-04 MED ORDER — DILTIAZEM LOAD VIA INFUSION
INTRAVENOUS | Status: DC | PRN
  Administered 2020-02-04 (×3): 1 mg via INTRAVENOUS

## 2020-02-04 MED ORDER — ROCURONIUM BROMIDE 10 MG/ML (PF) SYRINGE
PREFILLED_SYRINGE | INTRAVENOUS | Status: DC | PRN
  Administered 2020-02-04: 100 mg via INTRAVENOUS
  Administered 2020-02-04: 50 mg via INTRAVENOUS

## 2020-02-04 MED ORDER — DILTIAZEM HCL-DEXTROSE 125-5 MG/125ML-% IV SOLN (PREMIX)
5.0000 mg/h | INTRAVENOUS | Status: DC
  Administered 2020-02-04 – 2020-02-05 (×2): 5 mg/h via INTRAVENOUS
  Administered 2020-02-05: 02:00:00 7.5 mg/h via INTRAVENOUS
  Administered 2020-02-07 – 2020-02-08 (×2): 5 mg/h via INTRAVENOUS
  Administered 2020-02-09: 15 mg/h via INTRAVENOUS
  Administered 2020-02-09: 7.5 mg/h via INTRAVENOUS
  Administered 2020-02-09 – 2020-02-12 (×7): 15 mg/h via INTRAVENOUS
  Administered 2020-02-13: 01:00:00 10 mg/h via INTRAVENOUS
  Administered 2020-02-13: 11:00:00 5 mg/h via INTRAVENOUS
  Filled 2020-02-04 (×16): qty 125

## 2020-02-04 MED ORDER — FENTANYL 2500MCG IN NS 250ML (10MCG/ML) PREMIX INFUSION
0.0000 ug/h | INTRAVENOUS | Status: DC
  Administered 2020-02-04: 16:00:00 100 ug/h via INTRAVENOUS
  Administered 2020-02-05: 21:00:00 75 ug/h via INTRAVENOUS
  Administered 2020-02-07: 225 ug/h via INTRAVENOUS
  Administered 2020-02-07: 75 ug/h via INTRAVENOUS
  Administered 2020-02-08 (×2): 225 ug/h via INTRAVENOUS
  Administered 2020-02-09: 150 ug/h via INTRAVENOUS
  Administered 2020-02-09: 200 ug/h via INTRAVENOUS
  Administered 2020-02-10: 150 ug/h via INTRAVENOUS
  Administered 2020-02-11 – 2020-02-14 (×5): 100 ug/h via INTRAVENOUS
  Administered 2020-02-16: 150 ug/h via INTRAVENOUS
  Filled 2020-02-04 (×17): qty 250

## 2020-02-04 MED ORDER — ESMOLOL HCL 100 MG/10ML IV SOLN
INTRAVENOUS | Status: DC | PRN
  Administered 2020-02-04 (×4): 10 mg via INTRAVENOUS
  Administered 2020-02-04: 20 mg via INTRAVENOUS

## 2020-02-04 MED ORDER — DILTIAZEM HCL-DEXTROSE 125-5 MG/125ML-% IV SOLN (PREMIX)
5.0000 mg/h | INTRAVENOUS | Status: DC
  Filled 2020-02-04: qty 125

## 2020-02-04 MED ORDER — PROPOFOL 10 MG/ML IV BOLUS
INTRAVENOUS | Status: DC | PRN
  Administered 2020-02-04: 70 mg via INTRAVENOUS

## 2020-02-04 MED ORDER — LIDOCAINE HCL 1 % IJ SOLN
INTRAMUSCULAR | Status: AC
  Filled 2020-02-04: qty 20

## 2020-02-04 MED ORDER — PHENYLEPHRINE 40 MCG/ML (10ML) SYRINGE FOR IV PUSH (FOR BLOOD PRESSURE SUPPORT)
PREFILLED_SYRINGE | INTRAVENOUS | Status: DC | PRN
  Administered 2020-02-04: 120 ug via INTRAVENOUS

## 2020-02-04 MED ORDER — EPINEPHRINE PF 1 MG/ML IJ SOLN
INTRAMUSCULAR | Status: AC
  Filled 2020-02-04: qty 1

## 2020-02-04 MED ORDER — FENTANYL CITRATE (PF) 100 MCG/2ML IJ SOLN
INTRAMUSCULAR | Status: DC | PRN
  Administered 2020-02-04: 25 ug via INTRAVENOUS
  Administered 2020-02-04: 50 ug via INTRAVENOUS

## 2020-02-04 MED ORDER — PHENYLEPHRINE HCL-NACL 10-0.9 MG/250ML-% IV SOLN
INTRAVENOUS | Status: DC | PRN
  Administered 2020-02-04: 25 ug/min via INTRAVENOUS

## 2020-02-04 MED ORDER — ROCURONIUM BROMIDE 50 MG/5ML IV SOLN
100.0000 mg | Freq: Once | INTRAVENOUS | Status: AC
  Administered 2020-02-04: 13:00:00 100 mg via INTRAVENOUS

## 2020-02-04 MED ORDER — OXYCODONE HCL 5 MG/5ML PO SOLN: 5.0000 mg | Freq: Once | ORAL | Status: DC | PRN

## 2020-02-04 MED ORDER — DEXAMETHASONE SODIUM PHOSPHATE 4 MG/ML IJ SOLN
4.0000 mg | Freq: Two times a day (BID) | INTRAMUSCULAR | Status: AC
  Administered 2020-02-04 – 2020-02-08 (×8): 4 mg via INTRAVENOUS
  Filled 2020-02-04 (×8): qty 1

## 2020-02-04 MED ORDER — LIDOCAINE 2% (20 MG/ML) 5 ML SYRINGE
INTRAMUSCULAR | Status: DC | PRN
  Administered 2020-02-04: 60 mg via INTRAVENOUS

## 2020-02-04 MED ORDER — ONDANSETRON HCL 4 MG/2ML IJ SOLN
INTRAMUSCULAR | Status: AC
  Filled 2020-02-04: qty 2

## 2020-02-04 MED ORDER — FENTANYL CITRATE (PF) 100 MCG/2ML IJ SOLN: 50.0000 ug | Freq: Once | INTRAMUSCULAR | Status: DC

## 2020-02-04 MED ORDER — 0.9 % SODIUM CHLORIDE (POUR BTL) OPTIME
TOPICAL | Status: DC | PRN
  Administered 2020-02-04 (×2): 1000 mL

## 2020-02-04 MED ORDER — DOCUSATE SODIUM 50 MG/5ML PO LIQD
100.0000 mg | Freq: Two times a day (BID) | ORAL | Status: DC | PRN
  Administered 2020-02-14: 05:00:00 100 mg
  Filled 2020-02-04 (×2): qty 10

## 2020-02-04 MED ORDER — MIDAZOLAM HCL 2 MG/2ML IJ SOLN
INTRAMUSCULAR | Status: AC
  Filled 2020-02-04: qty 2

## 2020-02-04 MED ORDER — PROPOFOL 1000 MG/100ML IV EMUL
5.0000 ug/kg/min | INTRAVENOUS | Status: AC
  Filled 2020-02-04: qty 100

## 2020-02-04 MED ORDER — ROCURONIUM BROMIDE 50 MG/5ML IV SOLN: 0.6000 mg/kg | Freq: Once | INTRAVENOUS | Status: DC

## 2020-02-04 MED ORDER — PROMETHAZINE HCL 25 MG/ML IJ SOLN: 6.2500 mg | INTRAMUSCULAR | Status: DC | PRN

## 2020-02-04 MED ORDER — ONDANSETRON HCL 4 MG/2ML IJ SOLN
INTRAMUSCULAR | Status: DC | PRN
  Administered 2020-02-04: 4 mg via INTRAVENOUS

## 2020-02-04 MED ORDER — PROPOFOL 10 MG/ML IV BOLUS
INTRAVENOUS | Status: AC
  Filled 2020-02-04: qty 20

## 2020-02-04 MED ORDER — LIDOCAINE 2% (20 MG/ML) 5 ML SYRINGE
INTRAMUSCULAR | Status: AC
  Filled 2020-02-04: qty 5

## 2020-02-04 MED ORDER — FENTANYL BOLUS VIA INFUSION
50.0000 ug | INTRAVENOUS | Status: DC | PRN
  Administered 2020-02-05 – 2020-02-07 (×5): 50 ug via INTRAVENOUS
  Filled 2020-02-04: qty 50

## 2020-02-04 MED ORDER — LEVETIRACETAM IN NACL 500 MG/100ML IV SOLN
500.0000 mg | Freq: Two times a day (BID) | INTRAVENOUS | Status: AC
  Administered 2020-02-04 – 2020-02-11 (×14): 500 mg via INTRAVENOUS
  Filled 2020-02-04 (×15): qty 100

## 2020-02-04 MED ORDER — HYDROMORPHONE HCL 1 MG/ML IJ SOLN: 0.2500 mg | INTRAMUSCULAR | Status: DC | PRN

## 2020-02-04 MED ORDER — PROPOFOL 500 MG/50ML IV EMUL
INTRAVENOUS | Status: DC | PRN
  Administered 2020-02-04: 50 ug/kg/min via INTRAVENOUS

## 2020-02-04 MED ORDER — LACTATED RINGERS IV SOLN: INTRAVENOUS | Status: DC | PRN

## 2020-02-04 MED ORDER — ROCURONIUM BROMIDE 10 MG/ML (PF) SYRINGE
PREFILLED_SYRINGE | INTRAVENOUS | Status: AC
  Filled 2020-02-04: qty 10

## 2020-02-04 MED ORDER — FENTANYL CITRATE (PF) 250 MCG/5ML IJ SOLN
INTRAMUSCULAR | Status: AC
  Filled 2020-02-04: qty 5

## 2020-02-04 SURGICAL SUPPLY — 39 items
ADAPTER VALVE BIOPSY EBUS (MISCELLANEOUS) IMPLANT
ADPTR VALVE BIOPSY EBUS (MISCELLANEOUS)
BRUSH CYTOL CELLEBRITY 1.5X140 (MISCELLANEOUS) IMPLANT
CANISTER SUCT 3000ML PPV (MISCELLANEOUS) ×3 IMPLANT
CNTNR URN SCR LID CUP LEK RST (MISCELLANEOUS) ×1 IMPLANT
CONT SPEC 4OZ STRL OR WHT (MISCELLANEOUS) ×2
COVER BACK TABLE 60X90IN (DRAPES) ×3 IMPLANT
FILTER STRAW FLUID ASPIR (MISCELLANEOUS) IMPLANT
FORCEPS BIOP RJ4 1.8 (CUTTING FORCEPS) IMPLANT
FORCEPS RADIAL JAW LRG 4 PULM (INSTRUMENTS) IMPLANT
GAUZE SPONGE 4X4 12PLY STRL (GAUZE/BANDAGES/DRESSINGS) ×3 IMPLANT
GLOVE SURG SS PI 7.5 STRL IVOR (GLOVE) ×6 IMPLANT
GOWN STRL REUS W/ TWL LRG LVL3 (GOWN DISPOSABLE) ×2 IMPLANT
GOWN STRL REUS W/TWL LRG LVL3 (GOWN DISPOSABLE) ×4
KIT CLEAN ENDO COMPLIANCE (KITS) ×6 IMPLANT
KIT TURNOVER KIT B (KITS) ×3 IMPLANT
MARKER SKIN DUAL TIP RULER LAB (MISCELLANEOUS) ×3 IMPLANT
NDL ASPIRATION VIZISHOT 19G (NEEDLE) IMPLANT
NDL ASPIRATION VIZISHOT 21G (NEEDLE) IMPLANT
NEEDLE ASPIRATION VIZISHOT 19G (NEEDLE) IMPLANT
NEEDLE ASPIRATION VIZISHOT 21G (NEEDLE) IMPLANT
NS IRRIG 1000ML POUR BTL (IV SOLUTION) ×3 IMPLANT
OIL SILICONE PENTAX (PARTS (SERVICE/REPAIRS)) ×3 IMPLANT
PAD ARMBOARD 7.5X6 YLW CONV (MISCELLANEOUS) ×6 IMPLANT
RADIAL JAW LRG 4 PULMONARY (INSTRUMENTS) ×2
STOPCOCK 4 WAY LG BORE MALE ST (IV SETS) ×3 IMPLANT
SYR 20ML ECCENTRIC (SYRINGE) ×11 IMPLANT
SYR 20ML LL LF (SYRINGE) ×3 IMPLANT
SYR 3ML LL SCALE MARK (SYRINGE) IMPLANT
SYR 50ML SLIP (SYRINGE) ×3 IMPLANT
SYR 5ML LL (SYRINGE) ×12 IMPLANT
TRAP SPECIMEN MUCOUS 40CC (MISCELLANEOUS) IMPLANT
TUBE CONNECTING 20'X1/4 (TUBING) ×1
TUBE CONNECTING 20X1/4 (TUBING) ×2 IMPLANT
TUBING EXTENTION W/L.L. (IV SETS) ×3 IMPLANT
VALVE BIOPSY  SINGLE USE (MISCELLANEOUS) ×6
VALVE BIOPSY SINGLE USE (MISCELLANEOUS) ×1 IMPLANT
VALVE SUCTION BRONCHIO DISP (MISCELLANEOUS) ×7 IMPLANT
WATER STERILE IRR 1000ML POUR (IV SOLUTION) ×3 IMPLANT

## 2020-02-04 NOTE — Anesthesia Procedure Notes (Signed)
Arterial Line Insertion Start/End02/28/2021 8:45 AM, 02/02/2020 8:50 AM Performed by: Pervis Hocking, DO, anesthesiologist  Patient location: OR. Preanesthetic checklist: patient identified, IV checked, site marked, risks and benefits discussed, surgical consent, monitors and equipment checked, pre-op evaluation, timeout performed and anesthesia consent Lidocaine 1% used for infiltration Left, radial was placed Catheter size: 20 Fr Hand hygiene performed  and maximum sterile barriers used   Attempts: 1 Procedure performed without using ultrasound guided technique. Following insertion, dressing applied. Post procedure assessment: normal and unchanged  Patient tolerated the procedure well with no immediate complications.

## 2020-02-04 NOTE — Anesthesia Postprocedure Evaluation (Signed)
Anesthesia Post Note  Patient: Dahlia Client  Procedure(s) Performed: VIDEO BRONCHOSCOPY WITH BIOPSIES (N/A Bronchus)     Patient location during evaluation: PACU Anesthesia Type: General Level of consciousness: patient remains intubated per anesthesia plan and sedated Vital Signs Assessment: post-procedure vital signs reviewed and stable Respiratory status: respiratory function stable, patient on ventilator - see flowsheet for VS and patient remains intubated per anesthesia plan Cardiovascular status: blood pressure returned to baseline and stable Anesthetic complications: no Comments: Upon EBUS, complete obstruction of right mainstem bronchus. Decision to keep patient intubated and transport to [facility] for urgent lung radiation. Rapid Afib throughout case- diltiazem drip started, central line and arterial line placed. Will await transport to [facility] in PACU.    Last Vitals:  Vitals:   02/14/2020 1000 02/13/2020 1015  BP:  (!) 85/59  Pulse: 69 (!) 109  Resp: 16 12  Temp:    SpO2: 100% 100%    Last Pain:  Vitals:   01/30/2020 0322  TempSrc:   PainSc: 0-No pain                 Pervis Hocking

## 2020-02-04 NOTE — Progress Notes (Signed)
[facility] Therapy Treatment Record Phone [phone]   Radiation Therapy was administered to Shane Avila on: 02/11/2020  4:19 PM and was treatment # 1 out of a planned course of 1 treatments.  Radiation Treatment  1). Beam photons with 6-10 energy  2). Brachytherapy None  3). Stereotactic Radiosurgery None  4). Other Radiation None     Azion Centrella F Nicoletta Hush, RT (T)

## 2020-02-04 NOTE — Progress Notes (Signed)
[place]         [phone] ________________________________  Initial inpatient Consultation  Name: Shane Avila MRN: [id number]  Date: 02/07/2020  DOB: 02-May-1951  REFERRING PHYSICIAN: No ref. 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 Dyspnea  R06.00 DG CHEST PORT 1 VIEW    DG CHEST PORT 1 VIEW  2. AKI (acute kidney injury) ([place])  N17.9 US RENAL    US RENAL    HISTORY OF PRESENT ILLNESS::Shane Avila is a 69 y.o. former smoker (110 pack years) with a history of COPD and chronic hypoxemic respiratory failure on 2 L/min who presented to [facility] with several weeks of progressive mental status changes, per family.  Due to altered mental status, he had head CT at [facility] on 01/29/11 showed a hemorrhagic lesion in the left frontal brain.   He had actually been in the midst of a work-up for a subcarinal, right hilar mass that was first discovered on CT chest 11/13/2019 at [place].  Subsequent Chest CT without contrast 01/30/2020 at [facility] showed near complete occlusion of the right mainstem bronchus by tumor:   The patient was transferred to [facility] for further work-up and underwent bronchoscopy earlier today with Dr. Valeta Harms.  Intraoperative findings showed a significant amount of carinal tumor involvement.  The right lung was fully obstructed from tumor originating from the medial portion of the main carina.  The right-sided tumor involvement started at the level of the main carina and worked itself proximal along the anterior wall and distally along the medial wall. There was additional tumor involvement tracking down approximately 2.5 cm in the left mainstem along the medial wall.  Biopsies were taken and the patient was referred for emergent radiotherapy.   PREVIOUS RADIATION THERAPY:  No  History reviewed. No pertinent past medical history.:  History reviewed. No pertinent surgical history.:   Current Facility-Administered Medications:  .  0.9 % irrigation (POUR BTL), , , PRN, Icard, Bradley L, DO, 1,000 mL at 01/27/2020 0714 .  [MAR Hold] acetaminophen (TYLENOL) tablet 650 mg, 650 mg, Oral, Q6H PRN, 650 mg at 02/02/20 0216 **OR** [MAR Hold] acetaminophen (TYLENOL) suppository 650 mg, 650 mg, Rectal, Q6H PRN, Regalado, Belkys A, MD .  Doug Sou Hold] arformoterol (BROVANA) nebulizer solution 15 mcg, 15 mcg, Nebulization, BID, Collene Gobble, MD, 15 mcg at 02/03/20 2032 .  [MAR Hold] atorvastatin (LIPITOR) tablet 40 mg, 40 mg, Oral, QHS, Lucky Cowboy, MD, 40 mg at 02/03/20 2126 .  [MAR Hold] azithromycin (ZITHROMAX) 500 mg in sodium chloride 0.9 % 250 mL IVPB, 500 mg, Intravenous, Q24H, Regalado, Belkys A, MD, Last Rate: 250 mL/hr at 02/03/20 1842, 500 mg at 02/03/20 1842 .  [MAR Hold] benzonatate (TESSALON) capsule 200 mg, 200 mg, Oral, TID PRN, Blount, Xenia T, NP, 200 mg at 02/02/20 0216 .  butamben-tetracaine-benzocaine (CETACAINE) spray 1 spray, 1 spray, Topical, Once, Ollis, Brandi L, NP .  [MAR Hold] Chlorhexidine Gluconate Cloth 2 % PADS 6 each, 6 each, Topical, Daily, Regalado, Belkys A, MD, 6 each at 02/02/20 0854 .  [COMPLETED] dexamethasone (DECADRON) tablet 8 mg, 8 mg, Oral, Q12H, 8 mg at 02/02/20 2233 **FOLLOWED BY** [MAR Hold] dexamethasone (DECADRON) tablet 4 mg, 4 mg, Oral, Q12H, Lucky Cowboy, MD, 4 mg at 02/03/20 2127 .  diltiazem (CARDIZEM) 125 mg in dextrose  5% 125 mL (1 mg/mL) infusion, 5-15 mg/hr, Intravenous, Titrated, Icard, Bradley L, DO .  EPINEPHrine (ADRENALIN), , , PRN, Icard, Bradley L, DO, 1 mg at 02/10/2020 0805 .  [MAR Hold] folic acid (FOLVITE) tablet 1 mg, 1 mg, Oral, Daily, Lucky Cowboy, MD, 1 mg at 02/03/20 1000 .  [MAR Hold] ipratropium-albuterol (DUONEB) 0.5-2.5 (3) MG/3ML nebulizer solution 3 mL, 3 mL, Nebulization, Q6H PRN, Regalado,  Belkys A, MD .  Doug Sou Hold] levETIRAcetam (KEPPRA) tablet 500 mg, 500 mg, Oral, BID, Aroor, Lanice Schwab, MD, 500 mg at 02/03/20 2126 .  lidocaine (XYLOCAINE) 2 % jelly 1 application, 1 application, Topical, Once, Ollis, Brandi L, NP .  [MAR Hold] MEDLINE mouth rinse, 15 mL, Mouth Rinse, BID, Regalado, Belkys A, MD, 15 mL at 02/02/20 2233 .  [MAR Hold] nebivolol (BYSTOLIC) tablet 5 mg, 5 mg, Oral, Daily, Lucky Cowboy, MD, 5 mg at 02/03/20 1000 .  [MAR Hold] ondansetron (ZOFRAN) tablet 4 mg, 4 mg, Oral, Q6H PRN **OR** [MAR Hold] ondansetron (ZOFRAN) injection 4 mg, 4 mg, Intravenous, Q6H PRN, Regalado, Belkys A, MD .  Doug Sou Hold] pantoprazole (PROTONIX) EC tablet 40 mg, 40 mg, Oral, Daily, Lucky Cowboy, MD, 40 mg at 02/03/20 1000 .  phenylephrine (NEO-SYNEPHRINE) 0.25 % nasal spray 1 spray, 1 spray, Each Nare, Q6H PRN, Ollis, Brandi L, NP .  [MAR Hold] piperacillin-tazobactam (ZOSYN) IVPB 3.375 g, 3.375 g, Intravenous, Q8H, Kris Mouton, RPH, Last Rate: 12.5 mL/hr at 01/24/2020 0432, 3.375 g at 02/03/2020 0432 .  [MAR Hold] vitamin B-12 (CYANOCOBALAMIN) tablet 1,000 mcg, 1,000 mcg, Oral, Daily, Lucky Cowboy, MD, 1,000 mcg at 02/03/20 1000  Facility-Administered Medications Ordered in Other Encounters:  .  esmolol (BREVIBLOC) injection, , Intravenous, Anesthesia Intra-op, Kyung Rudd, CRNA, 10 mg at 01/24/2020 0825 .  fentaNYL (SUBLIMAZE) injection, , Intravenous, Anesthesia Intra-op, Kyung Rudd, CRNA, 50 mcg at 02/02/2020 0754 .  lactated ringers infusion, , Intravenous, Continuous PRN, Kyung Rudd, CRNA, New Bag at 01/24/2020 0700 .  lidocaine 2% (20 mg/mL) 5 mL syringe, , Intravenous, Anesthesia Intra-op, Kyung Rudd, CRNA, 60 mg at 01/20/2020 0738 .  phenylephrine (NEOSYNEPHRINE) 10-0.9 MG/250ML-% infusion, , Intravenous, Continuous PRN, Kyung Rudd, CRNA, Last Rate: 150 mL/hr at 02/07/2020 0833, 100 mcg/min at 01/29/2020 0833 .  PHENYLephrine 40 mcg/ml in normal saline Adult IV Push Syringe, ,  Intravenous, Anesthesia Intra-op, Kyung Rudd, CRNA, 120 mcg at 01/29/2020 0739 .  propofol (DIPRIVAN) 10 mg/mL bolus/IV push, , Intravenous, Anesthesia Intra-op, Kyung Rudd, CRNA, 70 mg at 02/03/2020 0738 .  rocuronium bromide 10 mg/mL (PF) syringe, , Intravenous, Anesthesia Intra-op, Kyung Rudd, CRNA, 100 mg at 01/22/2020 0837:  Allergies  Allergen Reactions  . Codeine Nausea Only  . Xanax [Alprazolam] Other (See Comments)    Patient stated "I don't like the way this makes me feel." He couldn't hold up his head up for 3 days after taking it and made him "ill." patient can tolerate low-dose Clonazepam.  :  History reviewed. No pertinent family history.:  Social History   Socioeconomic History  . Marital status: Married    Spouse name: Not on file  . Number of children: Not on file  . Years of education: Not on file  . Highest education level: Not on file  Occupational History  . Not on file  Tobacco Use  . Smoking status: Not on file  Substance and Sexual Activity  . Alcohol use: Not on file  .  Drug use: Not on file  . Sexual activity: Not on file  Other Topics Concern  . Not on file  Social History Narrative  . Not on file   Social Determinants of Health   Financial Resource Strain:   . Difficulty of Paying Living Expenses: Not on file  Food Insecurity:   . Worried About Charity fundraiser in the Last Year: Not on file  . Ran Out of Food in the Last Year: Not on file  Transportation Needs:   . Lack of Transportation (Medical): Not on file  . Lack of Transportation (Non-Medical): Not on file  Physical Activity:   . Days of Exercise per Week: Not on file  . Minutes of Exercise per Session: Not on file  Stress:   . Feeling of Stress : Not on file  Social Connections:   . Frequency of Communication with Friends and Family: Not on file  . Frequency of Social Gatherings with Friends and Family: Not on file  . Attends Religious Services: Not on file  . Active Member  of Clubs or Organizations: Not on file  . Attends Archivist Meetings: Not on file  . Marital Status: Not on file  Intimate Partner Violence:   . Fear of Current or Ex-Partner: Not on file  . Emotionally Abused: Not on file  . Physically Abused: Not on file  . Sexually Abused: Not on file  :  REVIEW OF SYSTEMS:  A 15 point review of systems is documented in the electronic medical record. This was obtained by the nursing staff. However, I reviewed this with the patient to discuss relevant findings and make appropriate changes.  Pertinent items are noted in HPI.   PHYSICAL EXAM:  Blood pressure (!) 109/91, pulse 95, temperature (!) 97.3 F (36.3 C), resp. rate 17, weight (!) 303 lb 5.7 oz (137.6 kg), SpO2 100 %. Per pulmonary:  General: adult male lying in bed in NAD HEENT: MM pink/dry, poor dentition, anicteric  Neuro: awake, alert, oriented to self, place, time / events.  MAE.  States he is willing to have procedures to work up malignant concerns. Tearful when talking about his wife.  CV: s1s2 RRR, no m/r/g PULM: non-labored but paradoxical movement, bronchial breath sounds on right, coarse on left GI: soft, bsx4 active  Extremities: warm/dry, no edema, BLE SCD's  Skin: no rashes or lesions Currently, on my inspection, the patient is comfortably sedated on a ventilator  KPS = 30  100 - Normal; no complaints; no evidence of disease. 90   - Able to carry on normal activity; minor signs or symptoms of disease. 80   - Normal activity with effort; some signs or symptoms of disease. 70   - Cares for self; unable to carry on normal activity or to do active work. 60   - Requires occasional assistance, but is able to care for most of his personal needs. 50   - Requires considerable assistance and frequent medical care. 37   - Disabled; requires special care and assistance. 25   - Severely disabled; hospital admission is indicated although death not imminent. 11   - Very sick;  hospital admission necessary; active supportive treatment necessary. 10   - Moribund; fatal processes progressing rapidly. 0     - Dead  Karnofsky DA, Abelmann WH, Craver LS and Burchenal [facility] [phone]) The use of the nitrogen mustards in the palliative treatment of carcinoma: with particular reference to bronchogenic carcinoma Cancer 1 [id number]  LABORATORY  DATA:  Lab Results  Component Value Date   WBC 13.2 (H) 01/20/2020   HGB 11.6 (L) 02/11/2020   HCT 38.5 (L) 02/09/2020   MCV 85.4 02/15/2020   PLT 293 02/03/2020   Lab Results  Component Value Date   NA 142 01/27/2020   K 4.0 02/14/2020   CL 104 01/22/2020   CO2 25 02/01/2020   Lab Results  Component Value Date   ALT 20 02/01/2020   AST 21 02/01/2020   ALKPHOS 111 02/01/2020   BILITOT 1.0 02/01/2020     RADIOGRAPHY: EEG  Result Date: 02/01/2020 Lora Havens, MD     02/01/2020 10:08 AM Patient Name: Rafiq Bucklin MRN: [id number] Epilepsy Attending: Lora Havens Referring Physician/Provider: Dr Amie [place] Date: 02/01/2020 Duration: 22.41 mins Patient history: 69yo M with ams. MRI brain showed possible hemorrhagic metastatic lesion in the left frontal lobe which is cortically based. EEG to evaluate for seizure. Level of alertness: awake AEDs during EEG study: keppra Technical aspects: This EEG study was done with scalp electrodes positioned according to the 10-20 International system of electrode placement. Electrical activity was acquired at a sampling rate of 500Hz  and reviewed with a high frequency filter of 70Hz  and a low frequency filter of 1Hz . EEG data were recorded continuously and digitally stored. DESCRIPTION: The posterior dominant rhythm consists of 8-9Hz  activity of moderate voltage (25-35 uV) seen predominantly in posterior head regions, symmetric and reactive to eye opening and eye closing.  Photic driving was seen in photic stimulation. Hyperventilation was not performed. IMPRESSION: This study is within normal  limits. No seizures or epileptiform discharges were seen throughout the recording. However, only wakefulness was recorded. If suspicion for interictal activity remains a concern, a prolonged study including sleep should be considered. Lora Havens   MR BRAIN WO CONTRAST  Result Date: 02/01/2020 CLINICAL DATA:  Brain mass or lesion. Additional history obtained from [place] of lung mass EXAM: MRI HEAD WITHOUT CONTRAST TECHNIQUE: Multiplanar, multiecho pulse sequences of the brain and surrounding structures were obtained without intravenous contrast. COMPARISON:  Brain MRI 02/11/2020 FINDINGS: Brain: The examination was prematurely terminated at the patient's request. Only axial and coronal diffusion-weighted imaging, sagittal T1 weighted imaging, axial T2 weighted imaging, axial T2 FLAIR imaging, axial T2* imaging and axial T1 FLAIR imaging was obtained. Postcontrast imaging could not be obtained. The acquired sequences are motion degraded. Most notably there is moderate motion degradation of the sagittal T1 weighted sequence and moderate motion degradation of the axial T2 FLAIR sequence. Unchanged from prior examination 02/03/2020, there is a 13 x 14 mm cortically based mass within the anterior left frontal lobe with associated susceptibility artifact compatible with blood products. Unchanged small amount of associated edema at this site. There is no effacement of the ventricular system or midline shift. No evidence of acute infarct. Background mild scattered T2/FLAIR hyperintensity within the cerebral white matter is nonspecific, but consistent with chronic small vessel ischemic disease. Mild generalized parenchymal atrophy. There is no extra-axial fluid collection. Vascular: Flow voids maintained within the proximal large arterial vessels. Skull and upper cervical spine: Within the limitations of motion degradation, no focal marrow lesion is identified. Sinuses/Orbits: Visualized  orbits demonstrate no acute abnormality. Mild ethmoid sinus mucosal thickening. Redemonstrated right mastoid effusion. IMPRESSION: Incomplete and motion degraded examination as described. The examination was prematurely terminated at the patient's request, prior to post-contrast imaging. Unchanged 14 mm cortically based hemorrhagic mass involving the anterior left frontal lobe with associated mild  edema. Findings are again indeterminate, but most concerning for a metastasis. Primary CNS neoplasm is also a consideration. Postcontrast imaging is recommended when the patient is better able to tolerate the study. Mild generalized parenchymal atrophy and chronic small vessel ischemic disease. Right mastoid effusion. Electronically Signed   By: Kellie Simmering DO   On: 02/01/2020 14:30   MR BRAIN WO CONTRAST  Result Date: 01/29/2020 CLINICAL DATA:  Follow-up examination for subarachnoid hemorrhage. History of right lung mass, smoking. EXAM: MRI HEAD WITHOUT CONTRAST TECHNIQUE: Multiplanar, multiecho pulse sequences of the brain and surrounding structures were obtained without intravenous contrast. COMPARISON:  Prior head CT from 01/30/2020. FINDINGS: Brain: Examination moderately degraded by motion artifact. Diffuse prominence of the CSF containing spaces compatible with generalized age-related cerebral atrophy. Patchy T2/FLAIR hyperintensity within the periventricular and deep white matter both cerebral hemispheres most consistent with chronic small vessel ischemic disease, mild to moderate in nature. No abnormal foci of restricted diffusion to suggest acute or subacute ischemia. No encephalomalacia to suggest chronic cortical infarction. There is an approximate 13 x 14 mm cortically based mass positioned at the anterior left frontal lobe, corresponding with abnormality seen on prior CT (series 15, image 17 associated susceptibility artifact compatible with blood products, also seen on prior CT. No other definite  discrete masses identified on this noncontrast examination. No midline shift. No hydrocephalus. No extra-axial fluid collection. Pituitary gland suprasellar region normal. Midline structures intact.). Exact measurements somewhat difficult given lack of IV contrast and motion artifact. Small amount of localized vasogenic edema without significant regional mass effect. Vascular: Major intracranial vascular flow voids are maintained. Skull and upper cervical spine: Craniocervical junction within normal limits. Visualized upper cervical spine grossly unremarkable. Bone marrow signal intensity within normal limits. No focal marrow replacing lesion. Scalp soft tissues unremarkable. Sinuses/Orbits: Patient status post ocular lens replacement on the left. Globes and orbital soft tissues demonstrate no acute finding. Paranasal sinuses are clear. Other: Right mastoid and middle ear effusion noted, of uncertain significance. Visualized nasopharynx grossly unremarkable. IMPRESSION: 1. Approximate 14 mm cortically based hemorrhagic mass involving the anterior left frontal lobe, corresponding with abnormality seen on prior CT. Mild localized vasogenic edema without significant regional mass effect. Finding is indeterminate, but most concerning for a possible solitary intracranial metastasis given the patient's pulmonary history. Primary CNS neoplasm could also be considered. Further assessment with postcontrast imaging recommended for complete evaluation. 2. Underlying mild age-related cerebral atrophy with mild-to-moderate chronic microvascular ischemic disease. Electronically Signed   By: Jeannine Boga M.D.   On: 02/15/2020 19:36   US RENAL  Result Date: 01/20/2020 CLINICAL DATA:  Acute renal insufficiency, renal cysts, renal calculi EXAM: RENAL / URINARY TRACT ULTRASOUND COMPLETE COMPARISON:  01/30/2020 FINDINGS: Right Kidney: Renal measurements: 17.1 x 10.1 x 7.7 cm = volume: 696 mL. Numerous right renal cysts are  identified measuring up to 8.9 cm, unchanged since recent CT. There is increased renal cortical echotexture. Left Kidney: Renal measurements: 16.9 x 8.4 by 7.1 cm = volume: 524 mL. Multiple left renal cysts are identified largest measuring 6.5 cm. Mild increased renal cortical echotexture. Bladder: The bladder is decompressed. Other: None. IMPRESSION: 1. Numerous bilateral renal cortical cysts. 2. Increased renal cortical echotexture consistent with medical renal disease. Electronically Signed   By: Randa Ngo M.D.   On: 02/06/2020 23:27   DG CHEST PORT 1 VIEW  Result Date: 01/30/2020 CLINICAL DATA:  Dyspnea EXAM: PORTABLE CHEST 1 VIEW COMPARISON:  02/15/2020 8:38 a.m, chest CT 02/03/2020. FINDINGS: Single frontal  view of the chest demonstrates stable right internal jugular catheter tip overlying superior vena cava. The cardiac silhouette is enlarged but stable. There is persistent but improving right basilar airspace disease. No effusion or pneumothorax. IMPRESSION: 1. Persistent but improving right basilar airspace disease, most compatible with postobstructive bronchopneumonia. Please refer to prior chest CT 02/03/2020 describing a central obstructive right hilar mass. Electronically Signed   By: Randa Ngo M.D.   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  He is now experiencing right mainstem bronchus obstruction and bleeding requiring emergent radiotherapy for airway preservation.  With further tumor progression, the patient may obstruct his left mainstem bronchus as well.  This calls for emergency radiotherapy to the central chest while the patient remains ventilator dependent starting later today.  His prognosis remains unknown, pending biopsy results and formal staging.  It should be remembered that patients with locally advanced lung cancer and a solitary brain  metastasis can carry an excellent prognosis.  For that reason, the patient's goal of care at this time remains curative, pending further information.  PLAN: Today, we will proceed with counseling the patient's family about the treatment options.  All discussed the logistics and delivery of radiation treatment as well as the anticipated acute and late sequelae.  The patient will begin a course of radiotherapy to the chest as soon as possible. I spent 75 minutes in the total encounter today reviewing outside records and images from [facility], records and images from [facility] and collecting information.   ------------------------------------------------   Tyler Pita, MD [place] Director and Director of Stereotactic Radiosurgery Direct Dial: [phone]  Fax: [phone] [email or web address]  Skype  [email or web address]

## 2020-02-04 NOTE — Progress Notes (Signed)
RT NOTE: RT placed patient on vent in PACU after procedure. RT placed patient on 8cc and on previous RR of 12 per CRNA. Per MD obtain ABG when patient is at [facility] long. RT will continue to monitor.

## 2020-02-04 NOTE — Progress Notes (Signed)
  Radiation Oncology         [phone] ________________________________  Name: Shane Avila MRN: [id number]  Date: 01/20/2020  DOB: 1951/10/26  SIMULATION AND TREATMENT PLANNING NOTE    ICD-10-CM   1. Dyspnea  R06.00 DG CHEST PORT 1 VIEW    DG CHEST PORT 1 VIEW  2. AKI (acute kidney injury) ([place])  N17.9 US RENAL    US RENAL  3. 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  Identity was confirmed.  All relevant records and images related to the planned course of therapy were reviewed.  The patient freely provided informed written consent to proceed with treatment after reviewing the details related to the planned course of therapy. The consent form was witnessed and verified by the simulation staff.  Then, the patient was set-up in a stable reproducible  supine position for radiation therapy.  CT images were obtained.  Surface markings were placed.  The CT images were loaded into the planning software.  Then the target and avoidance structures were contoured.  Treatment planning then occurred.  The radiation prescription was entered and confirmed.  Then, I designed and supervised the construction of a total of 6 medically necessary complex treatment devices, including a BodyFix immobilization mold custom fitted to the patient along with 5 multileaf collimators conformally shaped radiation around the treatment target while shielding critical structures such as the heart and spinal cord maximally.  I have requested : 3D Simulation  I have requested a DVH of the following structures: Left lung, right lung, spinal cord,  heart, esophagus, and target.  I have ordered:Nutrition Consult  SPECIAL TREATMENT PROCEDURE:  The planned course of therapy using radiation constitutes a special treatment procedure. Special care is required in the management of this patient for the following reasons.  The patient will be receiving concurrent chemotherapy requiring careful monitoring for increased toxicities of treatment including periodic laboratory values.  The special nature of the planned course of radiotherapy will require increased physician supervision and oversight to ensure patient's safety with optimal treatment outcomes.  PLAN:  The patient will receive 6 Gy in 1 fraction today.  The patient will then receive 8 Gy in 2 fractions and then 50 Gy in 25 fractions, depending on biopsy results and staging work-up.  ________________________________  Sheral Apley Tammi Klippel, M.D.

## 2020-02-04 NOTE — Progress Notes (Signed)
Pt left big toenail bloody pulling away from skin, cleansed and placed foam over toe and will hand off to day shift.

## 2020-02-04 NOTE — Progress Notes (Signed)
Pharmacy Antibiotic Note  Shane Avila is a 69 y.o. 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 Started on rocephin and azithromycin 2/12 for PNA, Rocephin changed to Zosyn 2/13 per Pharmacy  2/16 Transfer to [facility], Continue Zosyn & Azithromycin per CCM note  Plan - D3 full Zosyn 3,375gm IV q8h - D5 Azithromycin 500mg  IV q24 - no stop date -Will follow renal function, cultures and clinical progress  Height: 6' (182.9 cm) Weight: (!) 303 lb 5.7 oz (137.6 kg) IBW/kg (Calculated) : 77.6  Temp (24hrs), Avg:97.4 F (36.3 C), Min:97 F (36.1 C), Max:97.6 F (36.4 C)  Recent Labs  Lab 02/10/2020 1727 02/01/20 0518 02/02/20 0755 02/03/20 0450 02/14/2020 0451  WBC 13.2* 11.4* 9.2 12.2* 13.2*  CREATININE 2.06* 1.45* 1.12 1.23 1.10    Estimated Creatinine Clearance: 94.9 mL/min (by C-G formula based on SCr of 1.1 mg/dL).    Allergies  Allergen Reactions  . Codeine Nausea Only  . Xanax [Alprazolam] Other (See Comments)    Patient stated "I don't like the way this makes me feel." He couldn't hold up his head up for 3 days after taking it and made him "ill." patient can tolerate low-dose Clonazepam.   Antimicrobials this admission: 2/12 rocephin x1 2/12 azithromycin >> 2/13 zosyn >>  Dose adjustments this admission:  Microbiology results: 2/12 BCx: ngtd  Thank you for allowing pharmacy to be a part of this patient's care.

## 2020-02-04 NOTE — Anesthesia Procedure Notes (Signed)
Central Venous Catheter Insertion Performed by: Pervis Hocking, DO, anesthesiologist Start/End02/22/2021 9:15 AM, 02/01/2020 9:20 AM Patient location: OR. Preanesthetic checklist: patient identified, IV checked, site marked, risks and benefits discussed, surgical consent, monitors and equipment checked, pre-op evaluation, timeout performed and anesthesia consent Lidocaine 1% used for infiltration and patient sedated Hand hygiene performed  and maximum sterile barriers used  Catheter size: 8 Fr Total catheter length 16. Central line was placed.Double lumen Procedure performed using ultrasound guided technique. Ultrasound Notes:image(s) printed for medical record Attempts: 1 Following insertion, dressing applied and line sutured. Post procedure assessment: blood return through all ports  Patient tolerated the procedure well with no immediate complications. Additional procedure comments: RIJ from OSH recently removed, so went straight to [place].

## 2020-02-04 NOTE — Interval H&P Note (Signed)
History and Physical Interval Note:  02/13/2020 7:08 AM  Shane Avila  has presented today for surgery, with the diagnosis of [place].  The various methods of treatment have been discussed with the patient and family. After consideration of risks, benefits and other options for treatment, the patient has consented to  Procedure(s): [place] (N/A) as a surgical intervention.  The patient's history has been reviewed, patient examined, no change in status, stable for surgery.  I have reviewed the patient's chart and labs.  Questions were answered to the patient's satisfaction.    Discussed risks, benefits and alternatives with the patient as well as patients daughter via phone. All is agreeable to proceed. No Barriers to proceed.   [place]

## 2020-02-04 NOTE — Progress Notes (Signed)
Pt. transported to/from MRI w/o any complications.

## 2020-02-04 NOTE — Transfer of Care (Signed)
Immediate Anesthesia Transfer of Care Note  Patient: Shane Avila  Procedure(s) Performed: VIDEO BRONCHOSCOPY WITH BIOPSIES (N/A Bronchus)  Patient Location: PACU  Anesthesia Type:General  Level of Consciousness: sedated, unresponsive and Patient remains intubated per anesthesia plan  Airway & Oxygen Therapy: Patient remains intubated per anesthesia plan and Patient placed on Ventilator (see vital sign flow sheet for setting)  Post-op Assessment: Report given to RN  Post vital signs: Reviewed and stable  Last Vitals:  Vitals Value Taken Time  BP 97/67 02/13/2020 0944  Temp    Pulse 35 02/12/2020 0944  Resp 31 01/24/2020 0944  SpO2 100 % 02/03/2020 0944  Vitals shown include unvalidated device data.  Last Pain:  Vitals:   01/22/2020 0322  TempSrc:   PainSc: 0-No pain      Patients Stated Pain Goal: 0 (61/95/09 3267)  Complications: No apparent anesthesia complications

## 2020-02-04 NOTE — Op Note (Signed)
Video Bronchoscopy, endobronchial biopsies, right-sided endobronchial tumor debulking procedure note  Date of Operation: 02/10/2020  Pre-op Diagnosis: Right lung mass  Post-op Diagnosis: Right lung mass, obstructed right mainstem  Surgeon: Garner Nash, DO   Assistants: none  Anesthesia: General Endotracheal   Meds Given: per anesthesia record   Operation: Flexible video fiberoptic bronchoscopy and biopsies.  Estimated Blood Loss: 5cc  Complications: none noted  Indications and History: Shane Avila is 69 y.o. with history of tobacco abuse, abnormal CT imaging of the chest with large right-sided subcarinal lung mass involving the right mainstem. Recommendation was to perform video fiberoptic bronchoscopy with biopsies. The risks, benefits, complications, treatment options and expected outcomes were discussed with the patient.  The possibilities of pneumothorax, pneumonia, reaction to medication, pulmonary aspiration, perforation of a viscus, bleeding, failure to diagnose a condition and creating a complication requiring transfusion or operation were discussed with the patient who freely signed the consent.    Description of Procedure: The patient was seen in the Preoperative Area, was examined and was deemed appropriate to proceed.  The patient was taken to [facility], identified as [place] Client and the procedure verified as Flexible Video Fiberoptic Bronchoscopy.  A Time Out was held and the above information confirmed.   Conscious sedation was initiated as indicated above. The video fiberoptic bronchoscope was introduced via the L nare and a general inspection was performed which showed normal trachea, significant amount of carinal tumor involvement.  The right lung was fully obstructed from tumor originating from the medial portion of the main carina.  The right-sided tumor involvement started at the level of the main carina and worked itself proximal along the anterior wall and  distally along the medial wall. There was additional tumor involvement tracking down approximately 2.5 cm in the left mainstem along the medial wall.  We used 2 mm [employer] forceps to debride the right mainstem.  Multiple forcep biopsies and endobronchial biopsies were taken.  Bleeding was controlled with intermittent use of ice saline and diluted epinephrine.  Through recurrent forceps and tumor debridement we were able to debulk the right mainstem down to a small anterior 3 to 4 mm opening at approximately the noon to 3 o'clock position.  Intra-Op the patient developed rapid A. fib with RVR.  Blood pressure remained stable.  However due to the significant endobronchial tumor present the decision was made to leave the patient intubated.  Intraoperatively I discussed case with Dr. Ledon Snare from radiation oncology who was agreed to consider urgent radiation therapy to the right mainstem to help open this occluded segment.  Also intraoperatively I called and discussed this plan with the patient's daughter who is agreeable to proceed.  Patient will be transferred from from the OR intubated to PACU with a awaiting ICU bed transfer to [facility].  There he will be transferred to the cancer center for radiation therapy.  Samples: 1.  Endobronchial forcep biopsies from the right mainstem 2.  Right mainstem bronchial washings for cytology  Plans:  We will review the cytology, pathology and microbiology results with the patient when they become available.  Outpatient followup will be with Dr Valeta Harms.  Transfer to [facility] intensive care unit.  Plans for urgent radiation to the right chest.  Case discussed with Dr. Ledon Snare from radiation oncology.  If the patient was to have significant amount of bleeding from the endotracheal tube the endotracheal tube should be advanced into the left mainstem for unilateral left-sided lung  ventilation.  Do NOT ADVANCE the endotracheal tube into  the right mainstem  NO SUCTIONING on the mechanical ventilator into the airway   Garner Nash, DO [facility] 01/27/2020 9:09 AM

## 2020-02-04 NOTE — Progress Notes (Signed)
eLink Physician-Brief Progress Note Patient Name: Shane Avila DOB: 08-30-1951 MRN: [id number]   Date of Service  02/13/2020  HPI/Events of Note  Patient is on oral Keppra and Dexamethasone but is now intubated and without an NG tube.  eICU Interventions  Keppra changed to 500 mg iv Q 12 hours, dexamethasone changed to 4 mg iv Q 12 hours x 8 doses.        Kerry Kass Jermone Geister 01/20/2020, 11:08 PM

## 2020-02-04 NOTE — Anesthesia Procedure Notes (Signed)
Procedure Name: Intubation Date/Time: 01/25/2020 7:41 AM Performed by: Kyung Rudd, CRNA Pre-anesthesia Checklist: Patient identified, Emergency Drugs available, Suction available and Patient being monitored Patient Re-evaluated:Patient Re-evaluated prior to induction Oxygen Delivery Method: Circle system utilized Preoxygenation: Pre-oxygenation with 100% oxygen Induction Type: IV induction Ventilation: Mask ventilation without difficulty Laryngoscope Size: Mac and 4 Grade View: Grade I Tube type: Oral Tube size: 8.5 mm Number of attempts: 1 Airway Equipment and Method: Stylet and Oral airway Placement Confirmation: ETT inserted through vocal cords under direct vision,  positive ETCO2 and breath sounds checked- equal and bilateral Secured at: 22 cm Tube secured with: Tape Dental Injury: Teeth and Oropharynx as per pre-operative assessment

## 2020-02-05 ENCOUNTER — Ambulatory Visit
Admit: 2020-02-05 | Discharge: 2020-02-05 | Disposition: A | Discharge status: Home / Self Care | Payer: Medicare HMO | Attending: Radiation Oncology | Admitting: Radiation Oncology

## 2020-02-05 DIAGNOSIS — Z9289 Personal history of other medical treatment: Secondary | ICD-10-CM

## 2020-02-05 LAB — BASIC METABOLIC PANEL
Anion gap: 8 (ref 5–15)
BUN: 23 mg/dL (ref 8–23)
CO2: 25 mmol/L (ref 22–32)
Calcium: 9.2 mg/dL (ref 8.9–10.3)
Chloride: 110 mmol/L (ref 98–111)
Creatinine, Ser: 0.84 mg/dL (ref 0.61–1.24)
GFR calc Af Amer: >60 mL/min (ref >60)
GFR calc non Af Amer: >60 mL/min (ref >60)
Glucose, Bld: 117 mg/dL — ABNORMAL HIGH (ref 70–99)
Potassium: 4.2 mmol/L (ref 3.5–5.1)
Sodium: 143 mmol/L (ref 135–145)

## 2020-02-05 LAB — CULTURE, BLOOD (ROUTINE X 2)
Culture: NO GROWTH
Culture: NO GROWTH
Special Requests: ADEQUATE
Special Requests: ADEQUATE

## 2020-02-05 LAB — MRSA PCR SCREENING: MRSA by PCR: NEGATIVE

## 2020-02-05 LAB — MAGNESIUM: Magnesium: 1.9 mg/dL (ref 1.7–2.4)

## 2020-02-05 LAB — CBC
HCT: 40.6 % (ref 39.0–52.0)
Hemoglobin: 11.4 g/dL — ABNORMAL LOW (ref 13.0–17.0)
MCH: 25.6 pg — ABNORMAL LOW (ref 26.0–34.0)
MCHC: 28.1 g/dL — ABNORMAL LOW (ref 30.0–36.0)
MCV: 91.2 fL (ref 80.0–100.0)
Platelets: 323 10*3/uL (ref 150–400)
RBC: 4.45 MIL/uL (ref 4.22–5.81)
RDW: 15.9 % — ABNORMAL HIGH (ref 11.5–15.5)
WBC: 17.7 10*3/uL — ABNORMAL HIGH (ref 4.0–10.5)
nRBC: 0 % (ref 0.0–0.2)

## 2020-02-05 LAB — CYTOLOGY - NON PAP

## 2020-02-05 LAB — TRIGLYCERIDES: Triglycerides: 181 mg/dL — ABNORMAL HIGH (ref <150)

## 2020-02-05 LAB — SURGICAL PATHOLOGY

## 2020-02-05 MED ORDER — CHLORHEXIDINE GLUCONATE 0.12% ORAL RINSE (MEDLINE KIT)
15.0000 mL | Freq: Two times a day (BID) | OROMUCOSAL | Status: DC
  Administered 2020-02-05 – 2020-02-16 (×23): 15 mL via OROMUCOSAL

## 2020-02-05 MED ORDER — ACETAMINOPHEN 500 MG PO TABS: 1000.0000 mg | ORAL_TABLET | Freq: Once | ORAL | Status: DC

## 2020-02-05 MED ORDER — SODIUM CHLORIDE 0.9% FLUSH
10.0000 mL | Freq: Two times a day (BID) | INTRAVENOUS | Status: DC
  Administered 2020-02-05 – 2020-02-16 (×23): 10 mL

## 2020-02-05 MED ORDER — CHLORHEXIDINE GLUCONATE CLOTH 2 % EX PADS
6.0000 | MEDICATED_PAD | Freq: Every day | CUTANEOUS | Status: DC
  Administered 2020-02-05 – 2020-02-15 (×11): 6 via TOPICAL

## 2020-02-05 MED ORDER — SODIUM CHLORIDE 0.9% FLUSH: 10.0000 mL | INTRAVENOUS | Status: DC | PRN

## 2020-02-05 MED ORDER — ORAL CARE MOUTH RINSE
15.0000 mL | OROMUCOSAL | Status: DC
  Administered 2020-02-05 – 2020-02-16 (×110): 15 mL via OROMUCOSAL

## 2020-02-05 MED ORDER — PANTOPRAZOLE SODIUM 40 MG IV SOLR
40.0000 mg | INTRAVENOUS | Status: DC
  Administered 2020-02-05 – 2020-02-09 (×5): 40 mg via INTRAVENOUS
  Filled 2020-02-05 (×6): qty 40

## 2020-02-05 NOTE — Progress Notes (Signed)
Initial Nutrition Assessment  DOCUMENTATION CODES:   Morbid obesity  INTERVENTION:  - if OGT/NGT unable to be placed d/t mass/tumor location, recommend PEG/G-tube placement. - once TF feasible, recommend Pivot 1.5 @ 40 ml/hr with 60 ml prostat QID. - this regimen will provide 2240 kcal, 210 grams protein, and 729 ml free water.    NUTRITION DIAGNOSIS:   Inadequate oral intake related to inability to eat as evidenced by NPO status.  GOAL:   Provide needs based on ASPEN/SCCM guidelines  MONITOR:   Vent status, Labs, Weight trends  REASON FOR ASSESSMENT:   Ventilator  ASSESSMENT:   69 y.o. male with medical history of A. fib, R lung mass, and smoker. He presented to [facility] due to insomnia x4 days, chronic cough. His family reported that he was diagnosed with lung mass that he was to have biopsied but he went into cardiac arrest during initial attempt. Biopsy was re-scheduled but patient missed the appointment. His wife reported that he has been acting bizarre and talking nonsense.  Patient transferred from [address] [facility] [place] to [place] at [place] yesterday afternoon. Patient intubated on arrival to [facility]. No OGT/NGT in place. Able to talk with RN prior to entering patient's room and he is unaware of any plans for attempt to place a tube. He states that location of mass/tumor brings concern for bleeding if tube placement attempted.   Weight today is 308 lb and weight on 2/13 (admission) was 314 lb. No PTA weight information available.   Per notes: - lung mass with brain mets; subcarinal/R hilar mass--thought to be stage 4 lung cancer  - XRT simulation and first treatment on 2/16 - R lung collapse due to endobronchial obstruction - acute metabolic encephalopathy - ARF--improving   Patient is currently intubated on ventilator support MV: 7.6 L/min Temp (24hrs), Avg:97.8 F (36.6 C), Min:97.2 F (36.2 C), Max:99 F (37.2 C) Propofol: 24.8 ml/hr (655  kcal) BP: 153/92 and MAP: 109   Labs reviewed. Medications reviewed; 1 mg folvite/day, 1000 mcg oral cyanocobalamin/day.  Drips reviewed; propofol @ 30 mcg/kg/min, neo 2 80 mcg/min, fentanyl 2 75 mcg/hr.     NUTRITION - FOCUSED PHYSICAL EXAM:  completed; no muscle and no fat wasting, mild edema to BLE.   Diet Order:   Diet Order            Diet NPO time specified  Diet effective midnight              EDUCATION NEEDS:   No education needs have been identified at this time  Skin:  Skin Assessment: Reviewed RN Assessment  Last BM:  2/16  Height:   Ht Readings from Last 1 Encounters:  02/06/2020 6' (1.829 m)    Weight:   Wt Readings from Last 1 Encounters:  02/05/20 (!) 139.9 kg    Ideal Body Weight:  80.9 kg  BMI:  Body mass index is 41.83 kg/m.  Estimated Nutritional Needs:   Kcal:  1749-4496 kcal  Protein:  >/= 202 grams  Fluid:  >/= 2 L/day     Jarome Matin, MS, RD, LDN, CNSC Inpatient Clinical Dietitian RD pager # available in AMION  After hours/weekend pager # available in [facility]

## 2020-02-05 NOTE — Progress Notes (Signed)
NAME:  Shane Avila, MRN:  [id number], DOB:  [birth year], LOS: 5 ADMISSION DATE:  01/30/2020, CONSULTATION DATE: 02/01/2020 REFERRING MD: Dr. Tyrell Antonio, CHIEF COMPLAINT: Subcarinal mass  Brief History   69 year old former smoker (110 pack years) with a history of COPD and chronic hypoxemic respiratory failure on 2 L/min.  Also with hypertension, hyperlipidemia, atrial fibrillation and depression.  He apparently is undergoing work-up for a subcarinal, right hilar mass that was first discovered on CT chest 11/13/2019 at [place].  Biopsy was planned but has not yet been done. He was evaluated in the ED at [facility] 2/11 for confusion and "bizarre behavior".  CT head revealed a left frontal small hyperdense area, question SAH versus mass.  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  His other work-up revealed acute renal failure (improving).  Underwent Bronchoscopy 2/16 for obstructed right mainstem with Dr. Valeta Harms, with evidence of fully obstructed right lung from tumor originating from the medial portion of the main carina. During procedure patient developed Rapid A-fib with RVR.   If the patient was to have significant amount of bleeding from the endotracheal tube the endotracheal tube should be advanced into the left mainstem for unilateral left-sided lung ventilation.  Do NOT ADVANCE the endotracheal tube into the right mainstem  NO SUCTIONING on the mechanical ventilator into the airway  Past Medical History  COPD Chronic hypoxemic respiratory failure Hypertension Hyperlipidemia Atrial fibrillation Depression COVID-19 pneumonia in October 2020, did not require hospital admission  [place] [facility] Events   2/12 Admit   Consults:  Neurology PCCM  Procedures:  Bronchoscopy 2/16  Significant Diagnostic Tests:  CT chest Oval [place]) 01/30/2020 >> 5.1 x 4.8 cm subcarinal mass that  impacts the main carina and extrinsically compresses the right mainstem bronchus, right upper lobe airway and bronchus intermedius.  Unclear whether there is an endobronchial lesion.  Significant right lower lobe volume loss/atelectasis/collapse vs post-obstructive PNA.  Small right effusion MRI Brain w/o 2/12 >> approximate 36mm cortically based hemorrhagic mass inovlving the anterior left frontal lobe, mild vasogenic edema w/o significant regional mass effect MRI Brain w/o 2/13 >> motion degraded exam, unchanged 14 mm cortically based hemorrhagic mass   Micro Data:  SARSCov2 2/12 >> negative Blood 2/12 >>  Respiratory 2/12 >>  Antimicrobials:  Ceftriaxone 2/12 x1 Azithromycin 2/12 >>  Zosyn 2/13 >>   Interim history/subjective:  Remains intubated post bronchoscopy 2/16, no acute events reported overnight, he remains sedated on vent.   Objective   Blood pressure 126/82, pulse 90, temperature (!) 97.5 F (36.4 C), temperature source Axillary, resp. rate 12, height 6' (1.829 m), weight (!) 139.9 kg, SpO2 99 %.    Vent Mode: PRVC FiO2 (%):  [40 %-100 %] 40 % Set Rate:  [12 bmp] 12 bmp Vt Set:  [580 mL-620 mL] 620 mL PEEP:  [5 cmH20] 5 cmH20 Plateau Pressure:  [17 cmH20-27 cmH20] 19 cmH20   Intake/Output Summary (Last 24 hours) at 02/05/2020 0808 Last data filed at 02/05/2020 9629 Gross per 24 hour  Intake 4852.67 ml  Output 900 ml  Net 3952.67 ml   Filed Weights   02/03/20 0430 02/08/2020 0322 02/05/20 0500  Weight: (!) 138.7 kg (!) 137.6 kg (!) 139.9 kg    Examination: General: Chronically ill appearing elderly male on mechanical ventilation, in NAD HEENT: ETT, MM pink/moist, PERRL,  Neuro: Sedated on vent, withdraws from pain  CV: irregularly irregular rate and  rhythm, no murmur, rubs, or gallops,  PULM:  Bilateral rhonchi, tolerating vent, no increased work of breathing GI: soft, bowel sounds active in all 4 quadrants, non-tender, non-distended, tolerating TF Extremities:  warm/dry, no edema  Skin: no rashes or lesions   Resolved Hospital Problem list     Assessment & Plan:   Subcarinal/right hilar mass, almost certainly primary lung cancer, 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 -case reviewed with Neurosurgery, given no contrasted images and inability to determine actual size of mass vs contribution of hemorrhage. Dr. Nickolas Madrid ([facility]) made aware of case as well to follow peripherally / for Tumor Board review, appreciate assistance. -underwent video bronch 2/16, EBUS arranged for 0730 2/26  -Remained intubated post bronchoscopy due to significant tumor presence  Plan: No endotracheal suctioning at this time  If bleeding occurs advance ETT into left mainstem NOT RIGHT  [place] for signs of bleeding  Continue ventilator support with lung protective strategies  Wean PEEP and FiO2 for sats greater than 90%. Head of bed elevated 30 degrees. Plateau pressures less than 30 cm H20.  Follow intermittent chest x-ray and ABG.   SAT/SBT as tolerated, mentation preclude extubation  Ensure adequate pulmonary hygiene  Follow cultures  VAP bundle in place  PAD protocol  Suspected postobstructive pneumonia Plan: Continue empiric antibiotics  Follow cultures Tren WBC and fever curve   COPD Plan: Continue bronchodilators  Hold Pulmicort while on systemic steroid  Duonebs as needed  Consider adding Spiriva Respimat at discharge   Acute metabolics Encephalopathy   -Suspect largely due to his left frontal lobe lesion.  Consider also contribution of metabolic status, his renal insufficiency.  EEG 2/13 reassuring.   Plan: Neurology following, appreciate assistance  Seizure precautions  Continue AED, Keppra per neurology  Decadron   Acute renal failure, improving Plan: Follow renal function / urine output Trend Bmet Avoid nephrotoxins, ensure adequate renal perfusion  Enteral hydration    Labs   CBC: Recent Labs  Lab  02/01/20 0518 02/02/20 0755 02/03/20 0450 02/09/2020 0451 02/05/20 0500  WBC 11.4* 9.2 12.2* 13.2* 17.7*  HGB 10.1* 10.5* 11.3* 11.6* 11.4*  HCT 33.4* 34.9* 37.5* 38.5* 40.6  MCV 85.0 83.9 86.4 85.4 91.2  PLT 274 265 327 293 659    Basic Metabolic Panel: Recent Labs  Lab 02/01/20 0518 02/01/20 1103 02/02/20 0755 02/03/20 0450 01/21/2020 0451 02/05/20 0500  NA 137  --  140 140 142 143  K 3.6  --  4.1 3.9 4.0 4.2  CL 100  --  104 103 104 110  CO2 25  --  23 26 25 25   GLUCOSE 86  --  123* 131* 98 117*  BUN 42*  --  29* 30* 28* 23  CREATININE 1.45*  --  1.12 1.23 1.10 0.84  CALCIUM 9.1  --  9.7 9.8 10.0 9.2  MG  --  1.9  --   --   --  1.9   GFR: Estimated Creatinine Clearance: 125.4 mL/min (by C-G formula based on SCr of 0.84 mg/dL). Recent Labs  Lab 02/02/20 0755 02/03/20 0450 02/07/2020 0451 02/05/20 0500  WBC 9.2 12.2* 13.2* 17.7*    Liver Function Tests: Recent Labs  Lab 02/08/2020 1727 02/01/20 0518  AST 23 21  ALT 20 20  ALKPHOS 108 111  BILITOT 1.0 1.0  PROT 6.9 6.2*  ALBUMIN 2.2* 2.1*   No results for input(s): LIPASE, AMYLASE in the last 168 hours. No results for input(s): AMMONIA in  the last 168 hours.  ABG No results found for: PHART, PCO2ART, PO2ART, HCO3, TCO2, ACIDBASEDEF, O2SAT   Coagulation Profile: Recent Labs  Lab 02/01/20 0518  INR 1.3*    Cardiac Enzymes: No results for input(s): CKTOTAL, CKMB, CKMBINDEX, TROPONINI in the last 168 hours.  HbA1C: No results found for: HGBA1C  CBG: Recent Labs  Lab 02/03/20 0746  GLUCAP 123*     Critical care time:    Performed by: Johnsie Cancel   Total critical care time: 45  minutes  Critical care time was exclusive of separately billable procedures and treating other patients.  Critical care was necessary to treat or prevent imminent or life-threatening deterioration.  Critical care was time spent personally by me on the following activities: development of treatment plan with  patient and/or surrogate as well as nursing, discussions with consultants, evaluation of patient's response to treatment, examination of patient, obtaining history from patient or surrogate, ordering and performing treatments and interventions, ordering and review of laboratory studies, ordering and review of radiographic studies, pulse oximetry and re-evaluation of patient's condition.  Johnsie Cancel, NP-C [place] [facility] Contact / Pager information can be found on Amion  02/05/2020, 8:32 AM

## 2020-02-05 NOTE — Addendum Note (Signed)
Encounter addended by: Tyler Pita, MD on: 02/05/2020 1:16 PM  Actions taken: Level of Service modified

## 2020-02-05 NOTE — Progress Notes (Signed)
Pt gone for radiation. Will check on pt next day   Kari Baars, [place] [phone] [phone]

## 2020-02-05 NOTE — Progress Notes (Signed)
[place] Progress Note Patient Name: Shane Avila DOB: October 02, 1951 MRN: [id number]   Date of Service  02/05/2020  HPI/Events of Note  Pt needs morning labs  eICU Interventions  CBC, BMET, Mg++ ordered.        AmeLie Hollars U Gustavo Meditz 02/05/2020, 4:00 AM

## 2020-02-05 NOTE — Progress Notes (Signed)
Called E-link to change PO meds- Decadron, Keppra to IV, pt doesn't have OG tube because of the mass in his lung . Dr Lucile Shutters said to verify plan for patient with regards to inserting OG in the morning.   Called E-link regarding lab orders. E-link replied that they will inform MD if additional labs will be ordered.

## 2020-02-06 ENCOUNTER — Ambulatory Visit: Admit: 2020-02-06 | Payer: Medicare HMO

## 2020-02-06 LAB — BASIC METABOLIC PANEL
Anion gap: 8 (ref 5–15)
BUN: 20 mg/dL (ref 8–23)
CO2: 26 mmol/L (ref 22–32)
Calcium: 9.2 mg/dL (ref 8.9–10.3)
Chloride: 111 mmol/L (ref 98–111)
Creatinine, Ser: 0.71 mg/dL (ref 0.61–1.24)
GFR calc Af Amer: >60 mL/min (ref >60)
GFR calc non Af Amer: >60 mL/min (ref >60)
Glucose, Bld: 121 mg/dL — ABNORMAL HIGH (ref 70–99)
Potassium: 4.4 mmol/L (ref 3.5–5.1)
Sodium: 145 mmol/L (ref 135–145)

## 2020-02-06 LAB — CBC
HCT: 39.6 % (ref 39.0–52.0)
Hemoglobin: 11.1 g/dL — ABNORMAL LOW (ref 13.0–17.0)
MCH: 25.5 pg — ABNORMAL LOW (ref 26.0–34.0)
MCHC: 28 g/dL — ABNORMAL LOW (ref 30.0–36.0)
MCV: 91 fL (ref 80.0–100.0)
Platelets: 265 10*3/uL (ref 150–400)
RBC: 4.35 MIL/uL (ref 4.22–5.81)
RDW: 15.8 % — ABNORMAL HIGH (ref 11.5–15.5)
WBC: 16.1 10*3/uL — ABNORMAL HIGH (ref 4.0–10.5)
nRBC: 0 % (ref 0.0–0.2)

## 2020-02-06 LAB — TRIGLYCERIDES: Triglycerides: 168 mg/dL — ABNORMAL HIGH (ref <150)

## 2020-02-06 MED ORDER — MUPIROCIN CALCIUM 2 % EX CREA
TOPICAL_CREAM | Freq: Every day | CUTANEOUS | Status: DC
  Administered 2020-02-06 – 2020-02-16 (×2): 1 via TOPICAL
  Filled 2020-02-06 (×2): qty 15

## 2020-02-06 NOTE — Consult Note (Addendum)
WOC Nurse Consult Note: Reason for Consult: Consult requested for left great toe; performed remotely after review of progress notes and nursing flowsheet  Wound type: left great toe is reported to have a full thickness wound near the nailbed related to an ingrown toenail; 2X2X.1cm Dressing procedure/placement/frequency: Topical treatment orders provided for bedside nurses to perform daily as follows: Bactroban to provide antimicrobial benefits and promote moist healing.  Foam dressing to protect from further injury.  Pt should followup with a podiatrist after discharge for further recommendations. Please re-consult if further assistance is needed.  Thank-you,  Julien Girt MSN, [place], [place], [place], [place]

## 2020-02-06 NOTE — Progress Notes (Signed)
OT Cancellation Note  Patient Details Name: Shane Avila MRN: [id number] DOB: Oct 05, 1951   Cancelled Treatment:    Reason Eval/Treat Not Completed: Medical issues which prohibited therapy. Pt is on a vent:  Will check back early next week.  [place] 02/06/2020, 12:46 PM  Karsten Ro, OTR/L Acute Rehabilitation Services 02/06/2020

## 2020-02-06 NOTE — Progress Notes (Signed)
PT Cancellation Note  Patient Details Name: Shane Avila MRN: [id number] DOB: [birth year]   Cancelled Treatment:    Reason Eval/Treat Not Completed: Medical issues which prohibited therapy(pt ventilated, RN stated pt not appropriate for PT today. Will follow.)  Philomena Doheny PT 02/06/2020  Acute Rehabilitation Services Pager [phone] Office [phone]

## 2020-02-06 NOTE — Progress Notes (Signed)
Patient transported to and from radiation without complication.

## 2020-02-06 NOTE — Progress Notes (Signed)
NAME:  Shane Avila, MRN:  [id number], DOB:  1951/04/04, LOS: 6 ADMISSION DATE:  01/28/2020, CONSULTATION DATE: 02/01/2020 REFERRING MD: Dr. Tyrell Antonio, CHIEF COMPLAINT: Subcarinal mass  Brief History   69 year old former smoker (110 pack years) with a history of COPD and chronic hypoxemic respiratory failure on 2 L/min.  Also with hypertension, hyperlipidemia, atrial fibrillation and depression.  He apparently is undergoing work-up for a subcarinal, right hilar mass that was first discovered on CT chest 11/13/2019 at [facility].  Biopsy was planned but has not yet been done. He was evaluated in the ED at [facility] 2/11 for confusion and "bizarre behavior".  CT head revealed a left frontal small hyperdense area, question SAH versus mass.  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  His other work-up revealed acute renal failure (improving).  Underwent Bronchoscopy 2/16 for obstructed right mainstem with Dr. Valeta Harms, with evidence of fully obstructed right lung from tumor originating from the medial portion of the main carina. During procedure patient developed Rapid A-fib with RVR.   If the patient was to have significant amount of bleeding from the endotracheal tube the endotracheal tube should be advanced into the left mainstem for unilateral left-sided lung ventilation.  Do NOT ADVANCE the endotracheal tube into the right mainstem  NO SUCTIONING on the mechanical ventilator into the airway  Past Medical History  COPD Chronic hypoxemic respiratory failure Hypertension Hyperlipidemia Atrial fibrillation Depression COVID-19 pneumonia in October 2020, did not require hospital admission  [place] [facility] Events   2/12 Admit   Consults:  Neurology PCCM  Procedures:  Bronchoscopy 2/16  Significant Diagnostic Tests:  CT chest Oval Linsey) 01/30/2020 >> 5.1 x 4.8 cm subcarinal mass that  impacts the main carina and extrinsically compresses the right mainstem bronchus, right upper lobe airway and bronchus intermedius.  Unclear whether there is an endobronchial lesion.  Significant right lower lobe volume loss/atelectasis/collapse vs post-obstructive PNA.  Small right effusion MRI Brain w/o 2/12 >> approximate 45mm cortically based hemorrhagic mass inovlving the anterior left frontal lobe, mild vasogenic edema w/o significant regional mass effect MRI Brain w/o 2/13 >> motion degraded exam, unchanged 14 mm cortically based hemorrhagic mass   Micro Data:  SARSCov2 2/12 >> negative Blood 2/12 >> negative  Respiratory 2/12 >>   Antimicrobials:  Ceftriaxone 2/12 x1 Azithromycin 2/12 >>  Zosyn 2/13 >>   Interim history/subjective:  No acute events overnight, remains sedated on vent.   Objective   Blood pressure 100/76, pulse 68, temperature 97.6 F (36.4 C), temperature source Axillary, resp. rate 13, height 6' (1.829 m), weight (!) 141.9 kg, SpO2 97 %.    Vent Mode: PRVC FiO2 (%):  [30 %] 30 % Set Rate:  [12 bmp] 12 bmp Vt Set:  [620 mL] 620 mL PEEP:  [5 cmH20] 5 cmH20 Plateau Pressure:  [11 cmH20-20 cmH20] 11 cmH20   Intake/Output Summary (Last 24 hours) at 02/06/2020 0855 Last data filed at 02/06/2020 0824 Gross per 24 hour  Intake 3578.53 ml  Output 1330 ml  Net 2248.53 ml   Filed Weights   02/14/2020 0322 02/05/20 0500 02/06/20 0500  Weight: (!) 137.6 kg (!) 139.9 kg (!) 141.9 kg    Examination: General: Chronically ill appearing elderly male on mechanical ventilation, in NAD HEENT: ETT, MM pink/moist, PERRL,  Neuro: Sedated on vent CV: s1s2 regular rate and rhythm, no murmur, rubs, or gallops,  PULM:  Slightly diminished right,  no added breath sounds, no increased work of breathing  GI: soft, bowel sounds active in all 4 quadrants, non-tender, non-distended Extremities: warm/dry, no edema  Skin: no rashes or lesions   Resolved Hospital Problem list    Acute renal failure   Assessment & Plan:   Subcarinal/right hilar mass, almost certainly primary lung cancer, 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 -case reviewed with Neurosurgery, given no contrasted images and inability to determine actual size of mass vs contribution of hemorrhage. Dr. Nickolas Madrid ([place]) made aware of case as well to follow peripherally / for Tumor Board review, appreciate assistance. -underwent video bronch 2/16, EBUS arranged for 0730 2/26  -Remained intubated post bronchoscopy due to significant tumor presence  Plan: No endotracheal suctioning  If bleeding occurs advance ETT into left mainstem NOT RIGHT Continue ventilator support with lung protective strategies  Wean PEEP and FiO2 for sats greater than 90%. Head of bed elevated 30 degrees. Plateau pressures less than 30 cm H20.  Follow intermittent chest x-ray and ABG.   SAT/SBT as tolerated, mentation preclude extubation  Ensure adequate pulmonary hygiene  Follow cultures  VAP bundle in place  PAD protocol Continue XRT   Suspected postobstructive pneumonia with right lung collapse  Plan: Continue empiric antibiotics  Cultures negative Trend WBC and fever curve   COPD Plan: Continue bronchodilators  Hold Pulmicort while on systemic steroids  Duonebs as needed  Consider adding Spiriva at discharge   New onset Atrial Fibrillation  -Developed during bronch  P: Continue Cardizem infusion No anticoagulation due to mets and risk of bleeding   Acute metabolics Encephalopathy   -Suspect largely due to his left frontal lobe lesion.  Consider also contribution of metabolic status, his renal insufficiency.  EEG 2/13 reassuring.   Plan: Neurology following, appreciate assistance Seizure precautions  Continue AED, Keppra Decadron    Labs   CBC: Recent Labs  Lab 02/02/20 0755 02/03/20 0450 02/06/2020 0451 02/05/20 0500 02/06/20 0236  WBC 9.2 12.2* 13.2* 17.7* 16.1*   HGB 10.5* 11.3* 11.6* 11.4* 11.1*  HCT 34.9* 37.5* 38.5* 40.6 39.6  MCV 83.9 86.4 85.4 91.2 91.0  PLT 265 327 293 323 709    Basic Metabolic Panel: Recent Labs  Lab 02/01/20 0518 02/01/20 1103 02/02/20 0755 02/03/20 0450 01/22/2020 0451 02/05/20 0500 02/06/20 0236  NA   < >  --  140 140 142 143 145  K   < >  --  4.1 3.9 4.0 4.2 4.4  CL   < >  --  104 103 104 110 111  CO2   < >  --  23 26 25 25 26   GLUCOSE   < >  --  123* 131* 98 117* 121*  BUN   < >  --  29* 30* 28* 23 20  CREATININE   < >  --  1.12 1.23 1.10 0.84 0.71  CALCIUM   < >  --  9.7 9.8 10.0 9.2 9.2  MG  --  1.9  --   --   --  1.9  --    < > = values in this interval not displayed.   GFR: Estimated Creatinine Clearance: 132.7 mL/min (by C-G formula based on SCr of 0.71 mg/dL). Recent Labs  Lab 02/03/20 0450 01/24/2020 0451 02/05/20 0500 02/06/20 0236  WBC 12.2* 13.2* 17.7* 16.1*    Liver Function Tests: Recent Labs  Lab 02/08/2020 1727 02/01/20 0518  AST 23 21  ALT 20 20  ALKPHOS 108 111  BILITOT 1.0 1.0  PROT 6.9 6.2*  ALBUMIN 2.2* 2.1*   No results for input(s): LIPASE, AMYLASE in the last 168 hours. No results for input(s): AMMONIA in the last 168 hours.  ABG No results found for: PHART, PCO2ART, PO2ART, HCO3, TCO2, ACIDBASEDEF, O2SAT   Coagulation Profile: Recent Labs  Lab 02/01/20 0518  INR 1.3*    Cardiac Enzymes: No results for input(s): CKTOTAL, CKMB, CKMBINDEX, TROPONINI in the last 168 hours.  HbA1C: No results found for: HGBA1C  CBG: Recent Labs  Lab 02/03/20 0746  GLUCAP 123*     Critical care time:    Performed by: Johnsie Cancel   Total critical care time: 40  minutes  Critical care time was exclusive of separately billable procedures and treating other patients.  Critical care was necessary to treat or prevent imminent or life-threatening deterioration.  Critical care was time spent personally by me on the following activities: development of treatment plan  with patient and/or surrogate as well as nursing, discussions with consultants, evaluation of patient's response to treatment, examination of patient, obtaining history from patient or surrogate, ordering and performing treatments and interventions, ordering and review of laboratory studies, ordering and review of radiographic studies, pulse oximetry and re-evaluation of patient's condition.  Johnsie Cancel, NP-C [facility] Contact / Pager information can be found on Amion  02/06/2020, 8:55 AM

## 2020-02-07 ENCOUNTER — Inpatient Hospital Stay (HOSPITAL_COMMUNITY): Payer: Medicare HMO

## 2020-02-07 ENCOUNTER — Ambulatory Visit: Payer: Medicare HMO

## 2020-02-07 ENCOUNTER — Ambulatory Visit: Admit: 2020-02-07 | Payer: Medicare HMO

## 2020-02-07 ENCOUNTER — Ambulatory Visit
Admit: 2020-02-07 | Discharge: 2020-02-07 | Disposition: A | Discharge status: Home / Self Care | Payer: Medicare HMO | Attending: Radiation Oncology | Admitting: Radiation Oncology

## 2020-02-07 ENCOUNTER — Encounter (HOSPITAL_COMMUNITY): Payer: Self-pay | Admitting: Internal Medicine

## 2020-02-07 DIAGNOSIS — Z515 Encounter for palliative care: Secondary | ICD-10-CM

## 2020-02-07 DIAGNOSIS — Z7189 Other specified counseling: Secondary | ICD-10-CM

## 2020-02-07 LAB — BASIC METABOLIC PANEL
Anion gap: 6 (ref 5–15)
BUN: 24 mg/dL — ABNORMAL HIGH (ref 8–23)
CO2: 25 mmol/L (ref 22–32)
Calcium: 9.1 mg/dL (ref 8.9–10.3)
Chloride: 113 mmol/L — ABNORMAL HIGH (ref 98–111)
Creatinine, Ser: 0.86 mg/dL (ref 0.61–1.24)
GFR calc Af Amer: >60 mL/min (ref >60)
GFR calc non Af Amer: >60 mL/min (ref >60)
Glucose, Bld: 117 mg/dL — ABNORMAL HIGH (ref 70–99)
Potassium: 4.6 mmol/L (ref 3.5–5.1)
Sodium: 144 mmol/L (ref 135–145)

## 2020-02-07 LAB — CBC
HCT: 35.6 % — ABNORMAL LOW (ref 39.0–52.0)
Hemoglobin: 10.2 g/dL — ABNORMAL LOW (ref 13.0–17.0)
MCH: 25.9 pg — ABNORMAL LOW (ref 26.0–34.0)
MCHC: 28.7 g/dL — ABNORMAL LOW (ref 30.0–36.0)
MCV: 90.4 fL (ref 80.0–100.0)
Platelets: 199 10*3/uL (ref 150–400)
RBC: 3.94 MIL/uL — ABNORMAL LOW (ref 4.22–5.81)
RDW: 16.1 % — ABNORMAL HIGH (ref 11.5–15.5)
WBC: 14.6 10*3/uL — ABNORMAL HIGH (ref 4.0–10.5)
nRBC: 0 % (ref 0.0–0.2)

## 2020-02-07 LAB — TRIGLYCERIDES: Triglycerides: 145 mg/dL (ref <150)

## 2020-02-07 IMAGING — DX DG CHEST 1V PORT
1 series · 1 of 1 positions shown · non-contrast
Comparison: Three days ago

CLINICAL DATA: Intubation

EXAM:
PORTABLE CHEST 1 VIEW

[chest ap]
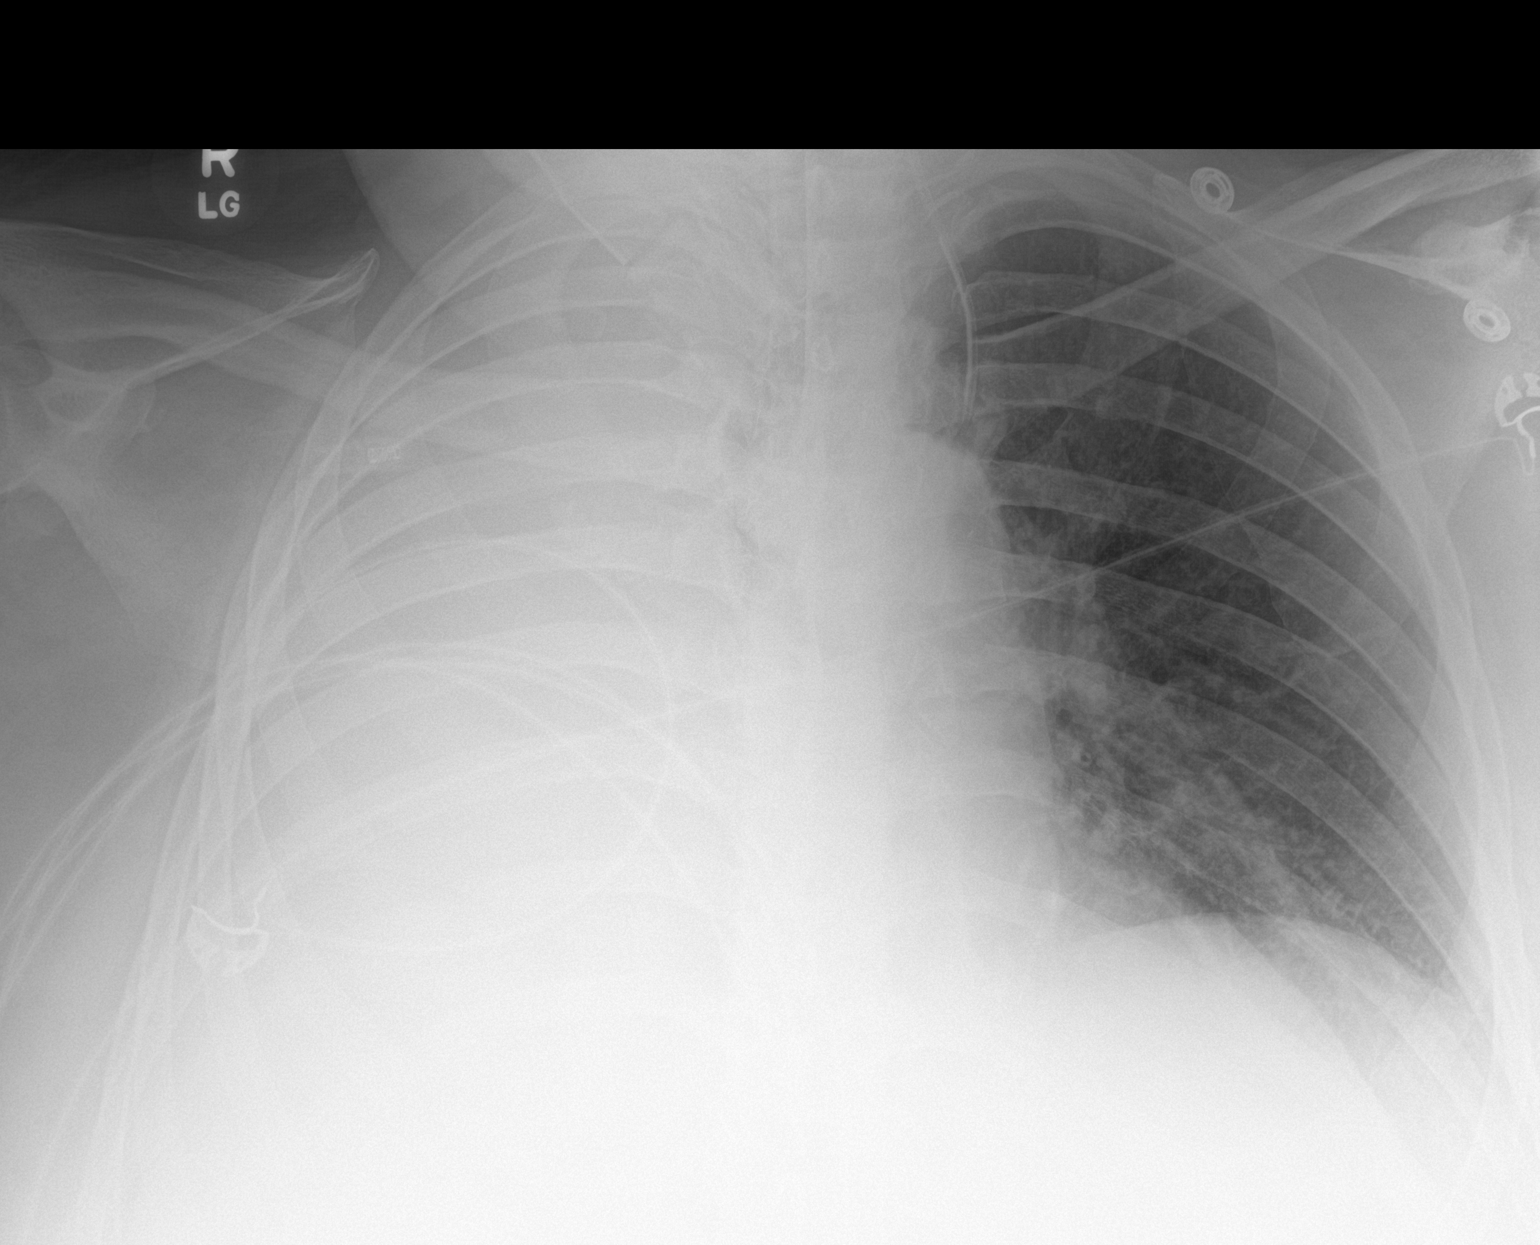

[1 of 1 positions shown; findings below may reference images not displayed]

FINDINGS: White out of the right chest with mild volume loss, unchanged. The
endotracheal tube tip is at the clavicular heads. Left IJ line with
tip more inferiorly directed. Assuming venous position this is in
the region of the left IJ/brachiocephalic confluence. Cardiac size
is largely obscured. Mild infiltrate at the left lung base.
IMPRESSION: 1. Continued complete white out of the right chest.
2. Improved position of the left neck catheter, assuming venous
cannulation the tip is at the IJ brachiocephalic confluence on the
left.

## 2020-02-07 IMAGING — DX DG CHEST 1V PORT
2 series · 2 of 2 positions shown · non-contrast
Comparison: Chest radiographs dated [DATE]. CT chest dated
[DATE]

CLINICAL DATA: Status post ETT exchange via bronchoscopy

EXAM:
PORTABLE CHEST 1 VIEW

[chest ap (1 of 2)]
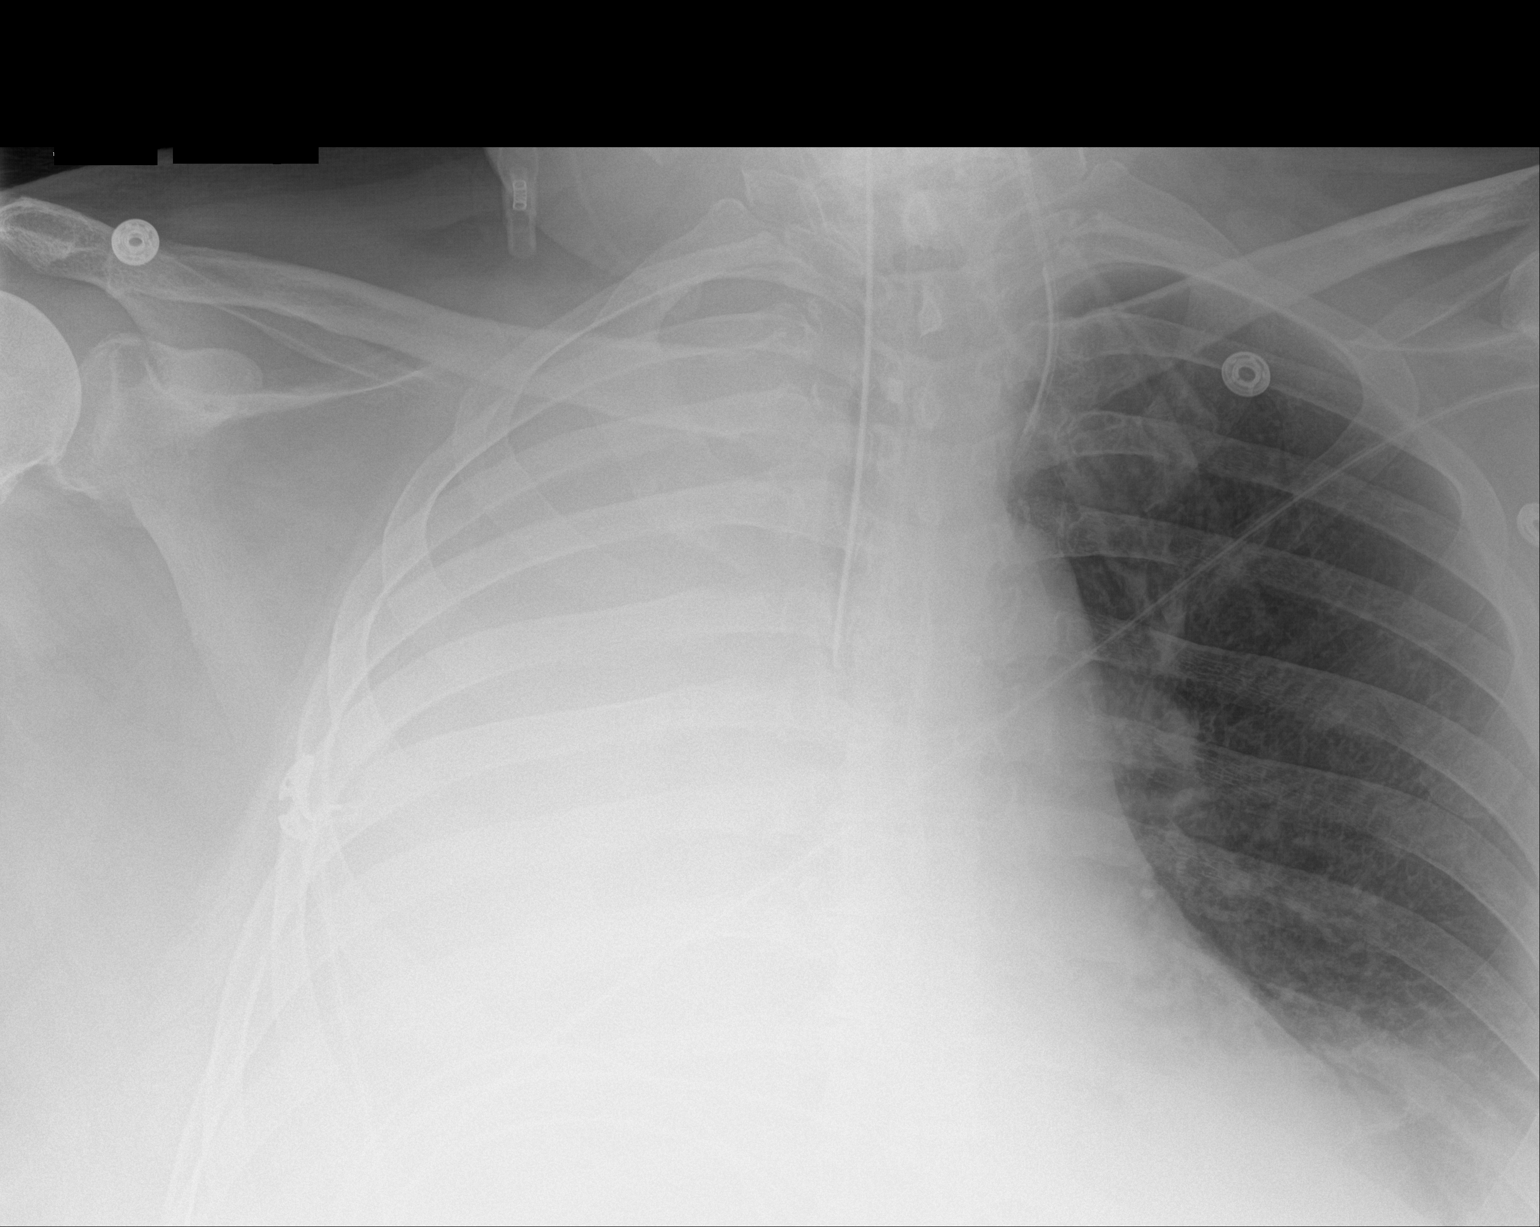

[chest ap (2 of 2)]
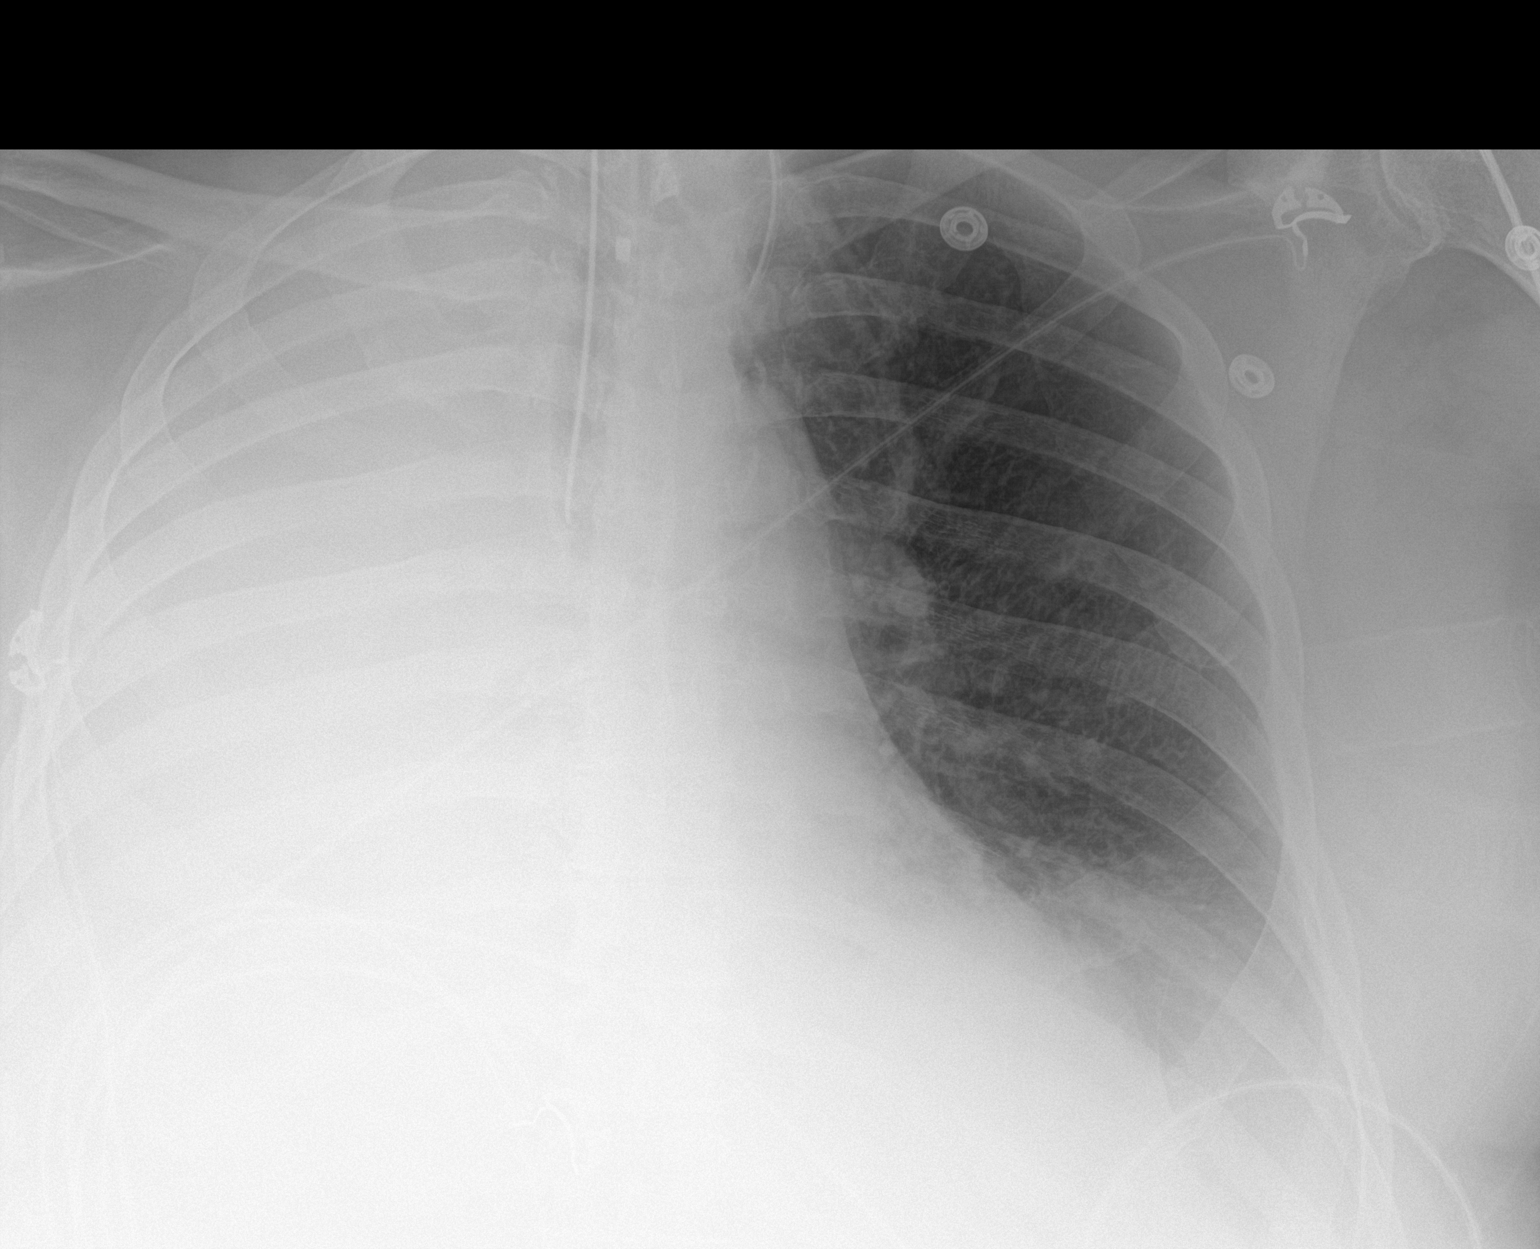

[2 of 2 positions shown; findings below may reference images not displayed]

FINDINGS: Endotracheal tube 14 mm above the carina.

Stable left IJ venous catheter.

Stable complete opacification of the right hemithorax. Mild left
basilar opacity, likely combination of atelectasis and small left
pleural effusion.

No pneumothorax.
IMPRESSION: Endotracheal tube terminates 14 mm above the carina.

Stable complete opacification of the right hemithorax.

## 2020-02-07 MED ORDER — MIDAZOLAM HCL 2 MG/2ML IJ SOLN
2.0000 mg | INTRAMUSCULAR | Status: DC | PRN
  Filled 2020-02-07: qty 2

## 2020-02-07 NOTE — Consult Note (Signed)
Consultation Note Date: 02/07/2020   Patient Name: Shane Avila  DOB: 1951-08-25  MRN: [id number]  Age / Sex: 69 y.o., male  PCP: Bonnita Nasuti, MD Referring Physician: [name], MD  Reason for Consultation: Establishing goals of care  HPI/Patient Profile: 69 y.o. male    admitted on 02/03/2020     69 year old with COPD, chronic hypoxic respiratory failure with lung mass, brain mets, right lung occlusion. S/p bronchoscopy on 2/16 and transfer to [facility] for initiation of XRT.  Clinical Assessment and Goals of Care:  Patient has been seen by radiation oncology. He remains admitted to [facility] service, in the ICU at [facility] in [place], [place].   Discussed with Dr Vaughan Browner. Patient seen and examined.   Call placed, unable to reach both daughters, call placed and discussed with wife. She is thankful for the care the patient is receiving, she has been in touch with his bedside nurse. She is aware that he is still on the ventilator and that he is off neo synephrine.   Palliative medicine is specialized medical care for people living with serious illness. It focuses on providing relief from the symptoms and stress of a serious illness. The goal is to improve quality of life for both the patient and the family.  Goals of care: Broad aims of medical therapy in relation to the patient's values and preferences. Our aim is to provide medical care aimed at enabling patients to achieve the goals that matter most to them, given the circumstances of their particular medical situation and their constraints.   Offered active listening and supportive care to the patient's wife on the phone. All of her questions addressed to the best of my ability. Brief life review also done. Family lives locally and wife is thankful for family support at this time. She is hopeful that he will be able to come home towards  the end of this hospitalization. Discussed about a step by step approach, taking it one step at a time. See below.   NEXT OF KIN  wife and 2 daughters. Family lives locally.   SUMMARY OF RECOMMENDATIONS   Agree with DNR Agree with current plan of care. Discussed with wife on the phone, family remains invested in home with hospice discharge plan if feasible. PMT to follow hospital course and assist with further goals of care discussions and disposition arrangements.  Thank you for the consult.   Code Status/Advance Care Planning:  DNR    Symptom Management:   As above   Palliative Prophylaxis:   Bowel Regimen  Psycho-social/Spiritual:   Desire for further Chaplaincy support:yes  Additional Recommendations: Caregiving  Support/Resources  Prognosis:   Unable to determine  Discharge Planning: To Be Determined      Primary Diagnoses: Present on Admission: . Primary cancer of right lower lobe of lung ([place])   I have reviewed the medical record, interviewed the patient and family, and examined the patient. The following aspects are pertinent.  History reviewed. No pertinent past medical history. Social  History   Socioeconomic History  . Marital status: Married    Spouse name: Not on file  . Number of children: Not on file  . Years of education: Not on file  . Highest education level: Not on file  Occupational History  . Not on file  Tobacco Use  . Smoking status: Not on file  Substance and Sexual Activity  . Alcohol use: Not on file  . Drug use: Not on file  . Sexual activity: Not on file  Other Topics Concern  . Not on file  Social History Narrative  . Not on file   Social Determinants of Health   Financial Resource Strain:   . Difficulty of Paying Living Expenses: Not on file  Food Insecurity:   . Worried About Charity fundraiser in the Last Year: Not on file  . Ran Out of Food in the Last Year: Not on file  Transportation Needs:   . Lack of  Transportation (Medical): Not on file  . Lack of Transportation (Non-Medical): Not on file  Physical Activity:   . Days of Exercise per Week: Not on file  . Minutes of Exercise per Session: Not on file  Stress:   . Feeling of Stress : Not on file  Social Connections:   . Frequency of Communication with Friends and Family: Not on file  . Frequency of Social Gatherings with Friends and Family: Not on file  . Attends Religious Services: Not on file  . Active Member of Clubs or Organizations: Not on file  . Attends Archivist Meetings: Not on file  . Marital Status: Not on file   History reviewed. No pertinent family history. Scheduled Meds: . acetaminophen  1,000 mg Oral Once  . arformoterol  15 mcg Nebulization BID  . atorvastatin  40 mg Oral QHS  . chlorhexidine gluconate (MEDLINE KIT)  15 mL Mouth Rinse BID  . Chlorhexidine Gluconate Cloth  6 each Topical Daily  . dexamethasone (DECADRON) injection  4 mg Intravenous Q12H  . folic acid  1 mg Oral Daily  . mouth rinse  15 mL Mouth Rinse 10 times per day  . mupirocin cream   Topical Daily  . nebivolol  5 mg Oral Daily  . pantoprazole (PROTONIX) IV  40 mg Intravenous Q24H  . sodium chloride flush  10-40 mL Intracatheter Q12H  . vitamin B-12  1,000 mcg Oral Daily   Continuous Infusions: . azithromycin Stopped (02/06/20 1845)  . diltiazem (CARDIZEM) infusion 5 mg/hr (02/07/20 1056)  . fentaNYL infusion INTRAVENOUS 125 mcg/hr (02/07/20 1056)  . levETIRAcetam Stopped (02/07/20 0932)  . phenylephrine Stopped (02/07/20 0030)  . piperacillin-tazobactam (ZOSYN)  IV 12.5 mL/hr at 02/07/20 1056  . propofol (DIPRIVAN) infusion 40 mcg/kg/min (02/07/20 1056)   PRN Meds:.acetaminophen **OR** acetaminophen, benzonatate, docusate, fentaNYL, ipratropium-albuterol, ondansetron **OR** ondansetron (ZOFRAN) IV, sodium chloride flush Medications Prior to Admission:  Prior to Admission medications   Medication Sig Start Date End Date  Taking? Authorizing Provider  amLODipine (NORVASC) 10 MG tablet Take 10 mg by mouth daily.   Yes [provider]  aspirin (BAYER LOW DOSE) 81 MG EC tablet Take 81 mg by mouth daily. Swallow whole.   Yes [provider]  Aspirin-Acetaminophen-Caffeine (GOODYS EXTRA STRENGTH) [phone] MG PACK Take 2 Packages by mouth 3 (three) times daily as needed (pain).   Yes [provider]  aspirin-sod bicarb-citric acid (ALKA-SELTZER) 325 MG TBEF tablet Take 325 mg by mouth every 6 (six) hours as needed (pain).  Yes [provider]  atorvastatin (LIPITOR) 40 MG tablet Take 40 mg by mouth daily. 10/23/19  Yes [provider]  BYSTOLIC 10 MG tablet Take 20 mg by mouth daily. 10/23/19  Yes [provider]  furosemide (LASIX) 40 MG tablet Take 40 mg by mouth 2 (two) times daily.   Yes [provider]  losartan-hydrochlorothiazide (HYZAAR) 100-25 MG tablet Take 1 tablet by mouth daily. 12/17/19  Yes [provider]  omeprazole (PRILOSEC) 40 MG capsule Take 40 mg by mouth daily as needed. 11/07/19  Yes [provider]  Donnal Debar 100-62.5-25 MCG/INH AEPB  11/27/19  Yes [provider]   Allergies  Allergen Reactions  . Codeine Nausea Only  . Xanax [Alprazolam] Other (See Comments)    Patient stated "I don't like the way this makes me feel." He couldn't hold up his head up for 3 days after taking it and made him "ill." patient can tolerate low-dose Clonazepam.   Review of Systems On the vent  Physical Exam Patient is on the ventilator Irregular, monitor noted No edema Skin warm and dry Sedated intubated mechanically ventilated  Vital Signs: BP 122/78   Pulse (!) 119   Temp 98.6 F (37 C) (Axillary)   Resp 14   Ht 6' (1.829 m)   Wt (!) 141.7 kg   SpO2 95%   BMI 42.37 kg/m  Pain Scale: CPOT   Pain Score: 0-No pain   SpO2: SpO2: 95 % O2 Device:SpO2: 95 % O2 Flow Rate: .O2 Flow Rate (L/min): 2  L/min  IO: Intake/output summary:   Intake/Output Summary (Last 24 hours) at 02/07/2020 1144 Last data filed at 02/07/2020 1056 Gross per 24 hour  Intake 2225.05 ml  Output 800 ml  Net 1425.05 ml    LBM: Last BM Date: 01/30/20 Baseline Weight: Weight: (!) 142.6 kg Most recent weight: Weight: (!) 141.7 kg     Palliative Assessment/Data:   PPS 20%  Time In:  11 Time Out:  12 Time Total:   60   Greater than 50%  of this time was spent counseling and coordinating care related to the above assessment and plan.  Signed by: Loistine Chance, MD   Please contact Palliative Medicine Team phone at [phone] for questions and concerns.  For individual provider: See Shea Evans

## 2020-02-07 NOTE — Progress Notes (Signed)
Patient ETT exchanged by PCCM MD, Dr. Vaughan Browner. Bedside bronch also performed at this time. Patient remained stable throughout both procedures and no complications were noted.

## 2020-02-07 NOTE — Progress Notes (Signed)
Patient was transported to radiation @ 1515 by RT, RN, and radiation transport; patient arrived at 3234 with no complications during transport to treatment. Once patient was on the table (appox 1550) for a brief period, vent began to alarm "high pressure" and "low minute ventilation"; O2 sats fell to 85%. Treatment was just beginning and stopped immediately due to alarms. The RN and myself went into the room and attempted to get him off the vent to manually ventilate him with a BVM and PEEP valve. By the time the table was returned to a low position, patient O2 sats in the low 70's and cyanosis seen around his lips by myself. Patient was immediately manually ventilated and O2 sats slowly began to increase to >90%. Patient was then returned to ICU bed and taken back to ICU. No further complications were noted during this timeframe.

## 2020-02-07 NOTE — Progress Notes (Signed)
Patient has had increase in secretions this morning, requiring frequent oral suction, oxygen saturation has also been lower because of this. 02 breaths given when patient drops to 90% with short term improvement. Contacted RT for support with snoring respirations and decrease in 02.

## 2020-02-07 NOTE — Procedures (Addendum)
Bronchoscopy Procedure Note Shane Avila [id number] 30-Jul-1951  Procedure: Bronchoscopy Indications: Diagnostic evaluation of the airways, assisting in ET tube exchange  Procedure Details Consent: Risks of procedure as well as the alternatives and risks of each were explained to the (patient/caregiver).  Consent for procedure obtained. Time Out: Verified patient identification, verified procedure, site/side was marked, verified correct patient position, special equipment/implants available, medications/allergies/relevent history reviewed, required imaging and test results available.  Performed  In preparation for procedure, patient was given 100% FiO2, bronchoscope lubricated and inhaled beta agonist administered. Sedation: Etomidate  Airway entered and the following bronchi were examined: Main trachea   Right mainstem remains completely occluded at the carina.  There is no significant bleeding but mild bloody mucus noted in the area.  Left mainstem is open but orifice is distorted and narrowed by tumor.  Position of the old ET tube and new exchanged ET tube above carina confirmed by bronchoscope.  Evaluation Hemodynamic Status: BP stable throughout; O2 sats: stable throughout Patient's Current Condition: stable Specimens:  None Complications: No apparent complications Patient did tolerate procedure well.  [name] MD [facility] Please see [email or web address] for pager details.  02/07/2020, 2:41 PM

## 2020-02-07 NOTE — Progress Notes (Signed)
NAME:  Shane Avila, MRN:  [id number], DOB:  [birth year], LOS: 7 ADMISSION DATE:  02/01/2020, CONSULTATION DATE: 02/01/2020 REFERRING MD: Dr. Tyrell Antonio, CHIEF COMPLAINT: Subcarinal mass  Brief History   69 year old former smoker (110 pack years) with a history of COPD and chronic hypoxemic respiratory failure on chronic O2 at 2 L/min.  Also with hypertension, hyperlipidemia, atrial fibrillation and depression.  He apparently is undergoing work-up for a subcarinal, right hilar mass that was first discovered on CT chest 11/13/2019 at [place].  Biopsy was planned but has not yet been done. He was evaluated in the ED at [facility] 2/11 for confusion and "bizarre behavior".  CT head revealed a left frontal small hyperdense area, question SAH versus mass.  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  His other work-up revealed acute renal failure (improving).  Underwent Bronchoscopy 2/16 for obstructed right mainstem with Dr. Valeta Harms, with evidence of fully obstructed right lung from tumor originating from the medial portion of the main carina. During procedure patient developed Rapid A-fib with RVR.   If the patient was to have significant amount of bleeding from the endotracheal tube the endotracheal tube should be advanced into the left mainstem for unilateral left-sided lung ventilation.  Do NOT ADVANCE the endotracheal tube into the right mainstem  NO SUCTIONING on the mechanical ventilator into the airway  Past Medical History  COPD Chronic hypoxemic respiratory failure Hypertension Hyperlipidemia Atrial fibrillation Depression COVID-19 pneumonia in October 2020, did not require hospital admission  [place] [facility] Events   2/12 Admit   Consults:  Neurology PCCM  Procedures:  Bronchoscopy 2/16  Significant Diagnostic Tests:  CT chest Oval Linsey) 01/30/2020 >> 5.1 x 4.8 cm  subcarinal mass that impacts the main carina and extrinsically compresses the right mainstem bronchus, right upper lobe airway and bronchus intermedius.  Unclear whether there is an endobronchial lesion.  Significant right lower lobe volume loss/atelectasis/collapse vs post-obstructive PNA.  Small right effusion MRI Brain w/o 2/12 >> approximate 65mm cortically based hemorrhagic mass inovlving the anterior left frontal lobe, mild vasogenic edema w/o significant regional mass effect MRI Brain w/o 2/13 >> motion degraded exam, unchanged 14 mm cortically based hemorrhagic mass   Micro Data:  SARSCov2 2/12 >> negative Blood 2/12 >> negative  Respiratory 2/12 >>   Antimicrobials:  Ceftriaxone 2/12 x1 Azithromycin 2/12 >>  Zosyn 2/13 >>   Interim history/subjective:  No acute events, sedated and intubated on full support . For radiation treatment 3/10 today. No weaning last 24 hours ETT with leak overnight, he is getting adequate volumes this am with minimal leak noted on the left ( 50-60 cc's). WBC 14.6 HGB 10 ( was 11.1) + 4600 cc's DNR established 2/18 Cardizem at 5 mg Sedated with propofol/ fentanyl Triglycerides 145  Objective   Blood pressure 127/72, pulse 80, temperature 98.6 F (37 C), temperature source Axillary, resp. rate 13, height 6' (1.829 m), weight (!) 141.7 kg, SpO2 95 %.    Vent Mode: PRVC FiO2 (%):  [30 %] 30 % Set Rate:  [12 bmp] 12 bmp Vt Set:  [620 mL] 620 mL PEEP:  [5 cmH20] 5 cmH20 Plateau Pressure:  [14 cmH20-18 cmH20] 18 cmH20   Intake/Output Summary (Last 24 hours) at 02/07/2020 0822 Last data filed at 02/07/2020 0500 Gross per 24 hour  Intake 2033.29 ml  Output 800 ml  Net 1233.29 ml   Filed Weights   02/05/20  0500 02/06/20 0500 02/07/20 0452  Weight: (!) 139.9 kg (!) 141.9 kg (!) 141.7 kg    Examination: General: Chronically ill appearing elderly male sedated and intubated , in NAD HEENT: NCAT, ETT, OG tube secure and intact MM pink/moist,  PERRL,  Neuro: Sedated on vent CV: s1s2 rate controlled atrial fibrillation, no murmur, rubs, or gallops,  PULM:  Bilateral chest excursion, Slightly diminished right, + wheezing noted, no increased work of breathing  GI: soft, bowel sounds active in all 4 quadrants, non-tender, non-distended, Body mass index is 42.37 kg/m. 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 -case reviewed with Neurosurgery, given no contrasted images and inability to determine actual size of mass vs contribution of hemorrhage. Dr. Nickolas Madrid ([place]) made aware of case as well to follow peripherally / for Tumor Board review, appreciate assistance. -underwent video bronch 2/16, EBUS arranged for 0730 2/26  -Remained intubated post bronchoscopy due to significant tumor presence  Plan: No endotracheal suctioning  If bleeding occurs advance ETT into left mainstem NOT RIGHT Continue ventilator support with lung protective strategies  Wean PEEP and FiO2 for sats greater than 90%. Head of bed elevated 30 degrees. Plateau pressures less than 30 cm H20.  Follow intermittent chest x-ray and ABG.   SAT/SBT as tolerated, mentation precludes extubation  Ensure adequate pulmonary hygiene  Follow cultures  VAP bundle in place  PAD protocol Continue XRT   Suspected postobstructive pneumonia with right lung collapse  Plan: Continue empiric antibiotics  Cultures negative Trend WBC and fever curve   Anemia HGB drop to 10.2 ( from 11.1)  + 4600 cc's>> ? Hemodilutional Plan Trend CBC Monitor for bleeding  Transfuse for HGB < 7  COPD Plan: Continue bronchodilators  Hold Pulmicort while on systemic steroids  Duonebs as needed  Consider  adding Spiriva at discharge   New onset Atrial Fibrillation  -Developed during bronch  P: Continue Cardizem infusion No anticoagulation due to mets and risk of bleeding Consider changing to po dosing   Acute metabolics Encephalopathy   -Suspect largely due to his left frontal lobe lesion.  Consider also contribution of metabolic status, his renal insufficiency.  EEG 2/13 reassuring.   Plan: Neurology following, appreciate assistance Seizure precautions  Continue AED, Keppra Decadron    Labs   CBC: Recent Labs  Lab 02/03/20 0450 01/25/2020 0451 02/05/20 0500 02/06/20 0236 02/07/20 0455  WBC 12.2* 13.2* 17.7* 16.1* 14.6*  HGB 11.3* 11.6* 11.4* 11.1* 10.2*  HCT 37.5* 38.5* 40.6 39.6 35.6*  MCV 86.4 85.4 91.2 91.0 90.4  PLT 327 293 323 265 034    Basic Metabolic Panel: Recent Labs  Lab 02/01/20 1103 02/02/20 0755 02/03/20 0450 01/25/2020 0451 02/05/20 0500 02/06/20 0236 02/07/20 0455  NA  --    < > 140 142 143 145 144  K  --    < > 3.9 4.0 4.2 4.4 4.6  CL  --    < > 103 104 110 111 113*  CO2  --    < > 26 25 25 26 25   GLUCOSE  --    < > 131* 98 117* 121* 117*  BUN  --    < > 30* 28* 23 20 24*  CREATININE  --    < >  1.23 1.10 0.84 0.71 0.86  CALCIUM  --    < > 9.8 10.0 9.2 9.2 9.1  MG 1.9  --   --   --  1.9  --   --    < > = values in this interval not displayed.   GFR: Estimated Creatinine Clearance: 123.3 mL/min (by C-G formula based on SCr of 0.86 mg/dL). Recent Labs  Lab 02/15/2020 0451 02/05/20 0500 02/06/20 0236 02/07/20 0455  WBC 13.2* 17.7* 16.1* 14.6*    Liver Function Tests: Recent Labs  Lab 02/11/2020 1727 02/01/20 0518  AST 23 21  ALT 20 20  ALKPHOS 108 111  BILITOT 1.0 1.0  PROT 6.9 6.2*  ALBUMIN 2.2* 2.1*   No results for input(s): LIPASE, AMYLASE in the last 168 hours. No results for input(s): AMMONIA in the last 168 hours.  ABG No results found for: PHART, PCO2ART, PO2ART, HCO3, TCO2, ACIDBASEDEF, O2SAT   Coagulation  Profile: Recent Labs  Lab 02/01/20 0518  INR 1.3*    Cardiac Enzymes: No results for input(s): CKTOTAL, CKMB, CKMBINDEX, TROPONINI in the last 168 hours.  HbA1C: No results found for: HGBA1C  CBG: Recent Labs  Lab 02/03/20 0746  GLUCAP 123*     Critical care time:    Performed by: Magdalen Spatz   Total critical care time: 30  minutes  Critical care time was exclusive of separately billable procedures and treating other patients.  Critical care was necessary to treat or prevent imminent or life-threatening deterioration.  Critical care was time spent personally by me on the following activities: development of treatment plan with patient and/or surrogate as well as nursing, discussions with consultants, evaluation of patient's response to treatment, examination of patient, obtaining history from patient or surrogate, ordering and performing treatments and interventions, ordering and review of laboratory studies, ordering and review of radiographic studies, pulse oximetry and re-evaluation of patient's condition.   Magdalen Spatz, MSN, AGACNP-BC [place] Pager # [phone] After 4 pm please call [phone] 02/07/2020, 8:22 AM

## 2020-02-07 NOTE — Progress Notes (Addendum)
20 mg IV Etomidate given during ET tube exchange. Verbal order from MD Mannam.

## 2020-02-07 NOTE — Procedures (Signed)
Intubation Procedure Note Shane Avila [id number] 01/24/1951  Procedure: Intubation, ETT tube change due to blown cuff Indications: Respiratory insufficiency  Procedure Details Consent: Risks of procedure as well as the alternatives and risks of each were explained to the (patient/caregiver).  Consent for procedure obtained. Time Out: Verified patient identification, verified procedure, site/side was marked, verified correct patient position, special equipment/implants available, medications/allergies/relevent history reviewed, required imaging and test results available.  Performed  Maximum sterile technique was used including cap, gloves, gown, hand hygiene, mask and sheet.   ET tube exchanged over bougie. Size 8, 28 cm at lip, Confirmed by end tidal Co2 color change and condenstaion Position confirmed by bronchoscope. Tube is positioned 3-4 cm above carina   Evaluation Hemodynamic Status: BP stable throughout; O2 sats: stable throughout Patient's Current Condition: stable Complications: No apparent complications Patient did tolerate procedure well. Chest X-ray ordered to verify placement.  CXR: pending.  [name] MD [name] [facility] Please see [email or web address] for pager details.  02/07/2020, 2:34 PM

## 2020-02-07 NOTE — Progress Notes (Signed)
Pharmacy Antibiotic Note  Shane Avila is a 69 y.o. male admitted on 01/22/2020 with confusion and possible hemorrhagic brain metastases. Started on rocephin and azithromycin 2/12 for PNA, Rocephin changed to Zosyn 2/13 per Pharmacy  2/16 Transfer to [facility], Continue Zosyn & Azithromycin per CCM note  Plan - D7 full Zosyn 3,375gm IV q8h for post-obstructive PNA - D8 Azithromycin 500mg  IV q24 - no stop date - Will follow renal function, cultures and clinical progress - Tmax 100.5, WBC 13 >> 18 >> 15, SCr wnl  Height: 6' (182.9 cm) Weight: (!) 312 lb 6.3 oz (141.7 kg) IBW/kg (Calculated) : 77.6  Temp (24hrs), Avg:99 F (37.2 C), Min:98.3 F (36.8 C), Max:100.5 F (38.1 C)  Recent Labs  Lab 02/03/20 0450 02/06/2020 0451 02/05/20 0500 02/06/20 0236 02/07/20 0455  WBC 12.2* 13.2* 17.7* 16.1* 14.6*  CREATININE 1.23 1.10 0.84 0.71 0.86    Estimated Creatinine Clearance: 123.3 mL/min (by C-G formula based on SCr of 0.86 mg/dL).    Allergies  Allergen Reactions  . Codeine Nausea Only  . Xanax [Alprazolam] Other (See Comments)    Patient stated "I don't like the way this makes me feel." He couldn't hold up his head up for 3 days after taking it and made him "ill." patient can tolerate low-dose Clonazepam.   Antimicrobials this admission: 2/12 rocephin x1 2/12 azithromycin >> 2/13 zosyn >>  Dose adjustments this admission:  Microbiology results: 2/12 blood: ngF 2/17 MRSA PCR: neg 2/13 HIV: NR 2/12 Covid: neg  Thank you for allowing pharmacy to be a part of this patient's care.  Minda Ditto PharmD 02/07/2020, 2:28 PM

## 2020-02-07 NOTE — Progress Notes (Signed)
[facility] Therapy Treatment Record Phone [phone]   Radiation Therapy was administered to Dahlia Client on: 02/07/2020  3:44 PM and was treatment # 1 out of a planned course of 25 treatments.  Radiation Treatment  1). Beam photons with 6-10 energy  2). Brachytherapy None  3). Stereotactic Radiosurgery None  4). Other Radiation None     Dennys Traughber A Tyquavious Gamel, RT (T)

## 2020-02-07 NOTE — Progress Notes (Addendum)
Brief Progress Note:  Received a call from bedside RN that patient's ETT cuff leak had worsened. He was unable to maintain volumes and his sats had dropped. Plan was to change out ETT over bougie.  I called and spoke with the patient's daughter's and wife. I explained the risks of the ETT exchange to include potential loss of airway and bleeding . They agreed they wanted [place] to proceed with the ETT exchange.   The patient was adequately sedated and paralyzed. Dr. Vaughan Browner bronched patient first to visualize the distance between the end of the ETT and the right main stem Tumor. Bougie was inserted to 35 cm to avoid the mass , ETT exchange was completed with no drop in sats, or any significant bleeding.Airway was maintained throughout the entire procedure.  Airway was visualized again with bronchoscopy after the ETT exchange to confirm placement above carina. There was no bleeding in the airway. No bleeding from the tumor occluding the right mainstem bronchus.  CXR was ordered. The patient tolerated the procedure well.   I called the family post procedure and let them know the ETT exchange went well and that the patient tolerated it without any complications. They verbalized understanding. They plan on visiting the patient later this afternoon.  CXR pending  Magdalen Spatz, MSN, AGACNP-BC [place] Pager # [phone] After 4 pm please call [phone] 02/07/2020 2:47 PM

## 2020-02-08 ENCOUNTER — Inpatient Hospital Stay (HOSPITAL_COMMUNITY): Payer: Medicare HMO

## 2020-02-08 LAB — PHOSPHORUS: Phosphorus: 3.8 mg/dL (ref 2.5–4.6)

## 2020-02-08 LAB — CBC
HCT: 35.2 % — ABNORMAL LOW (ref 39.0–52.0)
Hemoglobin: 9.9 g/dL — ABNORMAL LOW (ref 13.0–17.0)
MCH: 25.9 pg — ABNORMAL LOW (ref 26.0–34.0)
MCHC: 28.1 g/dL — ABNORMAL LOW (ref 30.0–36.0)
MCV: 92.1 fL (ref 80.0–100.0)
Platelets: 207 10*3/uL (ref 150–400)
RBC: 3.82 MIL/uL — ABNORMAL LOW (ref 4.22–5.81)
RDW: 16.1 % — ABNORMAL HIGH (ref 11.5–15.5)
WBC: 16.7 10*3/uL — ABNORMAL HIGH (ref 4.0–10.5)
nRBC: 0 % (ref 0.0–0.2)

## 2020-02-08 LAB — COMPREHENSIVE METABOLIC PANEL
ALT: 13 U/L (ref 0–44)
AST: 11 U/L — ABNORMAL LOW (ref 15–41)
Albumin: 2.2 g/dL — ABNORMAL LOW (ref 3.5–5.0)
Alkaline Phosphatase: 65 U/L (ref 38–126)
Anion gap: 8 (ref 5–15)
BUN: 38 mg/dL — ABNORMAL HIGH (ref 8–23)
CO2: 24 mmol/L (ref 22–32)
Calcium: 9.2 mg/dL (ref 8.9–10.3)
Chloride: 110 mmol/L (ref 98–111)
Creatinine, Ser: 1.32 mg/dL — ABNORMAL HIGH (ref 0.61–1.24)
GFR calc Af Amer: >60 mL/min (ref >60)
GFR calc non Af Amer: 56 mL/min — ABNORMAL LOW (ref >60)
Glucose, Bld: 121 mg/dL — ABNORMAL HIGH (ref 70–99)
Potassium: 5.1 mmol/L (ref 3.5–5.1)
Sodium: 142 mmol/L (ref 135–145)
Total Bilirubin: 0.6 mg/dL (ref 0.3–1.2)
Total Protein: 5.6 g/dL — ABNORMAL LOW (ref 6.5–8.1)

## 2020-02-08 LAB — GLUCOSE, CAPILLARY
Glucose-Capillary: 104 mg/dL — ABNORMAL HIGH (ref 70–99)
Glucose-Capillary: 112 mg/dL — ABNORMAL HIGH (ref 70–99)

## 2020-02-08 LAB — TRIGLYCERIDES: Triglycerides: 160 mg/dL — ABNORMAL HIGH (ref <150)

## 2020-02-08 LAB — MAGNESIUM
Magnesium: 2.4 mg/dL (ref 1.7–2.4)
Magnesium: 2.5 mg/dL — ABNORMAL HIGH (ref 1.7–2.4)

## 2020-02-08 IMAGING — DX DG ABD PORTABLE 1V
1 series · 1 of 1 positions shown · non-contrast
Comparison: Earlier film of the same day

CLINICAL DATA: Nasogastric tube placement

EXAM:
PORTABLE ABDOMEN - 1 VIEW

[abdomen kub]
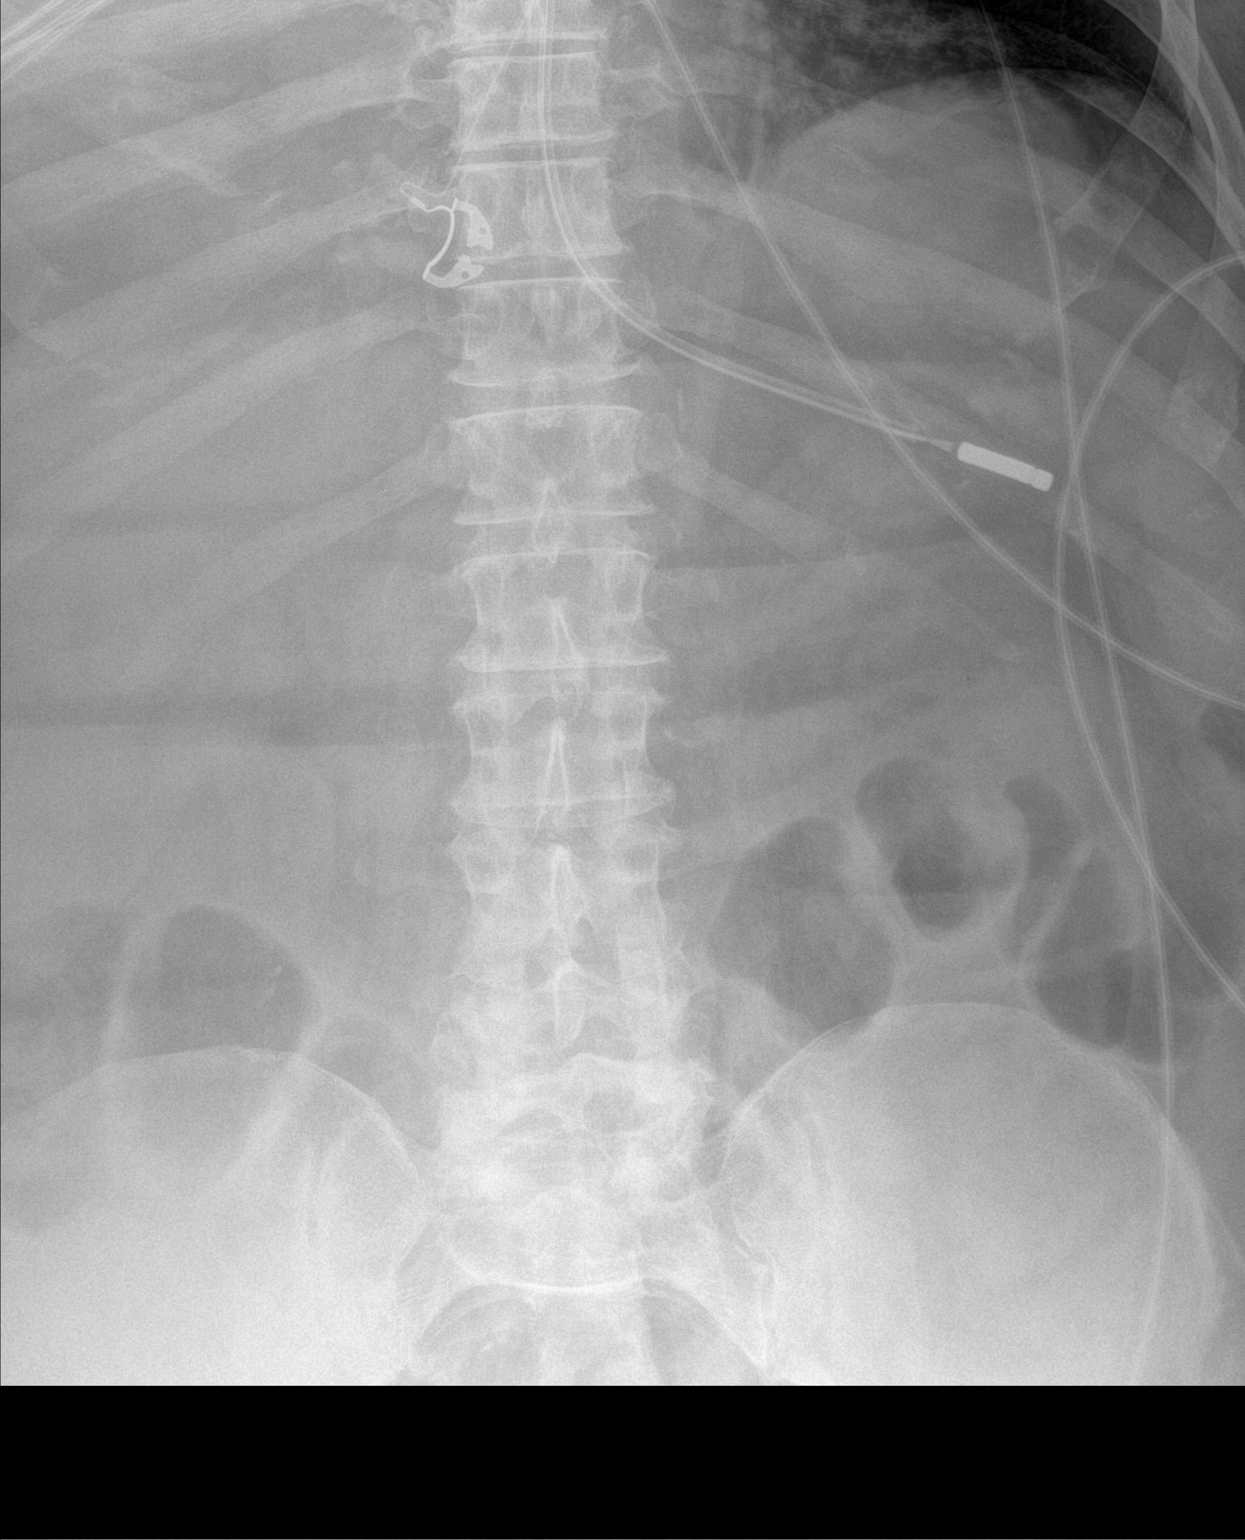

[1 of 1 positions shown; findings below may reference images not displayed]

FINDINGS: Weighted tip feeding tube has been advanced into the decompressed
stomach. Visualized bowel gas pattern normal. Opacification of the
visualized right lung base.
IMPRESSION: Weighted tip feeding tube in the decompressed stomach.

## 2020-02-08 IMAGING — DX DG CHEST 1V PORT
1 series · 1 of 1 positions shown · non-contrast
Comparison: Chest radiograph from one day prior.

CLINICAL DATA: Acute respiratory failure

EXAM:
PORTABLE CHEST 1 VIEW

[chest ap]
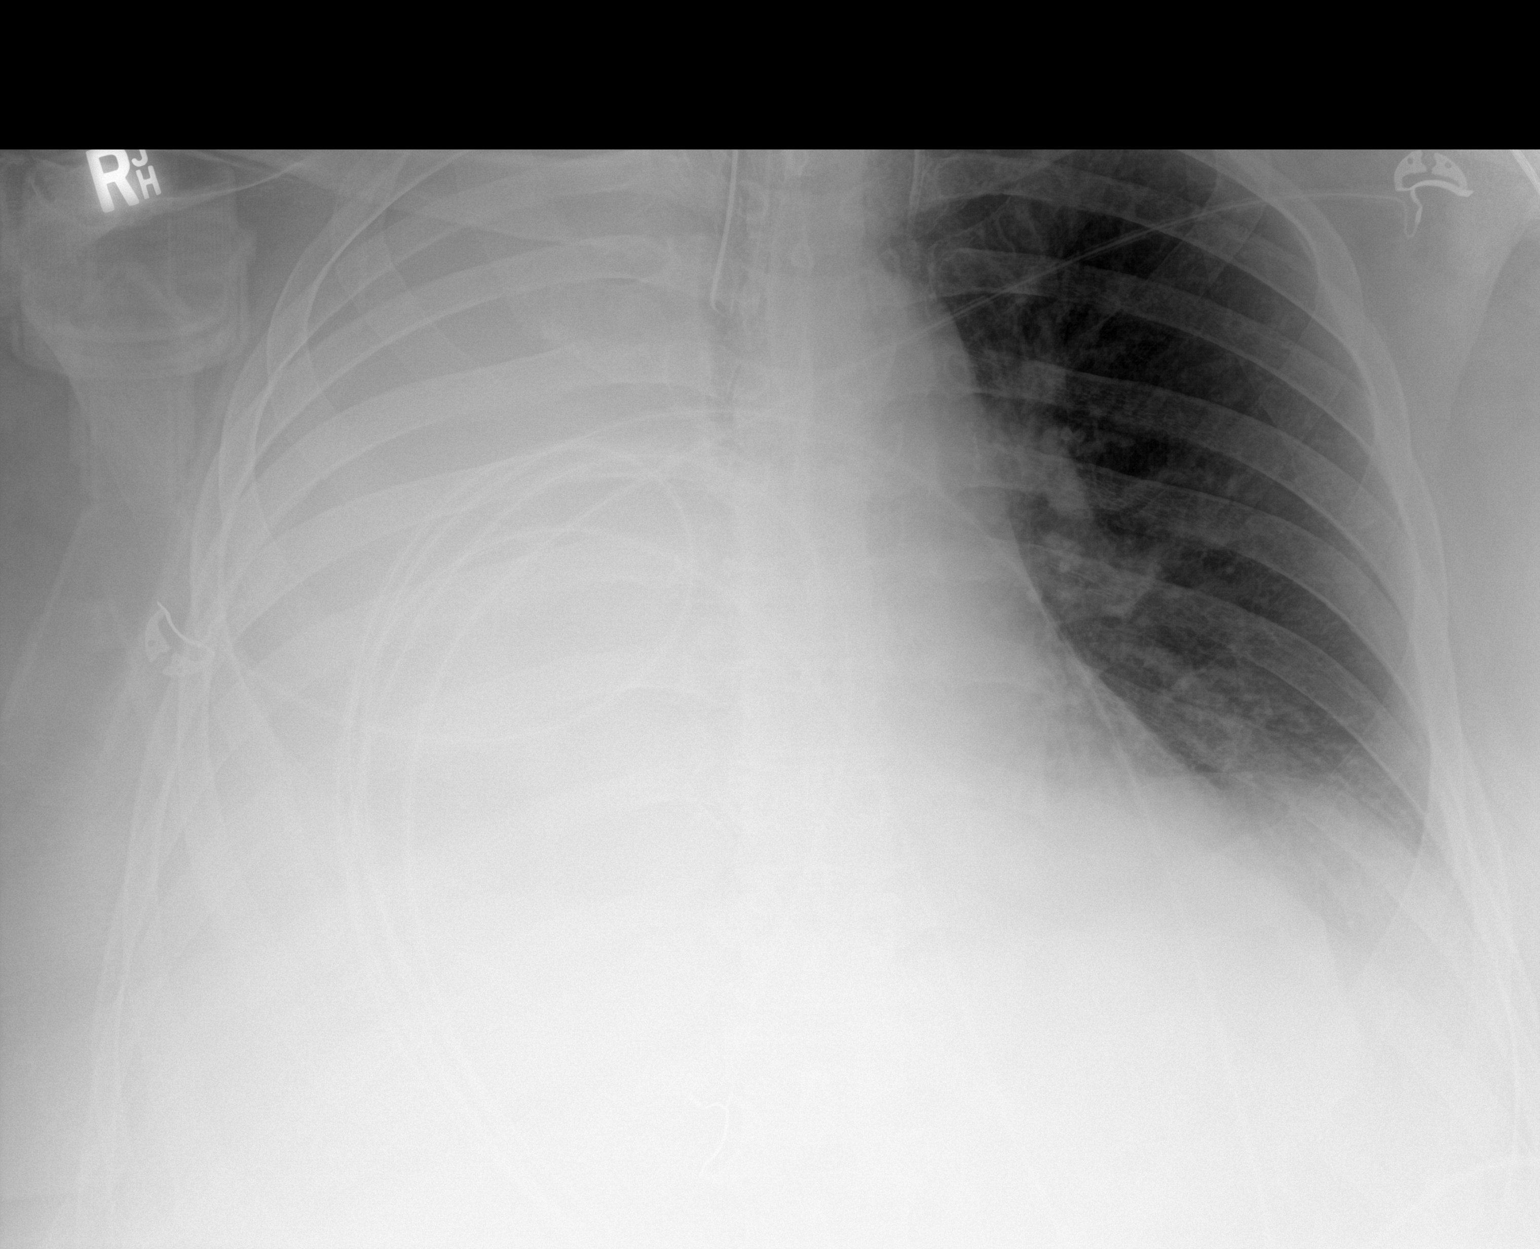

[1 of 1 positions shown; findings below may reference images not displayed]

FINDINGS: Endotracheal tube tip is 3.3 cm above the carina. Left internal
jugular central venous catheter terminates in the high left
mediastinum. Stable complete opacification of the right hemithorax.
Clear left lung. Stable obscured cardiomediastinal silhouette. No
pneumothorax. No left pleural effusion.
IMPRESSION: 1. Stable complete right hemithorax opacification compatible with a
combination of atelectasis, known central right lung mass and
possible right pleural effusion component.
2. Clear left lung.
3. Support structures as detailed.

## 2020-02-08 MED ORDER — ENOXAPARIN SODIUM 40 MG/0.4ML ~~LOC~~ SOLN
40.0000 mg | SUBCUTANEOUS | Status: DC
  Administered 2020-02-08 – 2020-02-09 (×2): 40 mg via SUBCUTANEOUS
  Filled 2020-02-08 (×2): qty 0.4

## 2020-02-08 MED ORDER — PRO-STAT SUGAR FREE PO LIQD
30.0000 mL | Freq: Two times a day (BID) | ORAL | Status: DC
  Administered 2020-02-08 – 2020-02-10 (×5): 30 mL
  Filled 2020-02-08 (×6): qty 30

## 2020-02-08 MED ORDER — VITAL HIGH PROTEIN PO LIQD
1000.0000 mL | ORAL | Status: DC
  Administered 2020-02-08 – 2020-02-09 (×2): 1000 mL

## 2020-02-08 NOTE — Progress Notes (Addendum)
NAME:  Shane Avila, MRN:  [id number], DOB:  Jun 03, 1951, LOS: 8 ADMISSION DATE:  01/20/2020, CONSULTATION DATE: 02/01/2020 REFERRING MD: Dr. Tyrell Antonio, CHIEF COMPLAINT: Subcarinal mass  Brief History   69 year old former smoker (110 pack years) with a history of COPD and chronic hypoxemic respiratory failure on chronic O2 at 2 L/min.  Also with hypertension, hyperlipidemia, atrial fibrillation and depression.  He apparently is undergoing work-up for a subcarinal, right hilar mass that was first discovered on CT chest 11/13/2019 at [place].  Biopsy was planned but has not yet been done. He was evaluated in the ED at [facility] 2/11 for confusion and "bizarre behavior".  CT head revealed a left frontal small hyperdense area, question SAH versus mass.  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  His other work-up revealed acute renal failure (improving).  Underwent Bronchoscopy 2/16 for obstructed right mainstem with Dr. Valeta Harms, with evidence of fully obstructed right lung from tumor originating from the medial portion of the main carina. During procedure patient developed Rapid A-fib with RVR.   If the patient was to have significant amount of bleeding from the endotracheal tube the endotracheal tube should be advanced into the left mainstem for unilateral left-sided lung ventilation.  Do NOT ADVANCE the endotracheal tube into the right mainstem  NO SUCTIONING on the mechanical ventilator into the airway  Past Medical History  COPD Chronic hypoxemic respiratory failure Hypertension Hyperlipidemia Atrial fibrillation Depression COVID-19 pneumonia in October 2020, did not require hospital admission  [place] Hospital Events   2/12 Admit  2/16 Bronch with diagnosis of Sq celll cancer, tx to [facility] for XRT 2/19 ETT tube exchange due to blown cuff  Consults:  Neurology PCCM Radiation  oncology  Procedures:  Bronchoscopy 2/16, 2/19 ETT 2/16 >> Lt IJ CVL 2/16 >> Lt radial A aline 2/16 > 2/19  Significant Diagnostic Tests:  CT chest Oval Linsey) 01/30/2020 >> 5.1 x 4.8 cm subcarinal mass that impacts the main carina and extrinsically compresses the right mainstem bronchus, right upper lobe airway and bronchus intermedius.  Unclear whether there is an endobronchial lesion.  Significant right lower lobe volume loss/atelectasis/collapse vs post-obstructive PNA.  Small right effusion MRI Brain w/o 2/12 >> approximate 81mm cortically based hemorrhagic mass inovlving the anterior left frontal lobe, mild vasogenic edema w/o significant regional mass effect MRI Brain w/o 2/13 >> motion degraded exam, unchanged 14 mm cortically based hemorrhagic mass   Micro Data:  SARSCov2 2/12 >> negative Blood 2/12 >> negative  Respiratory 2/12 >>   Antimicrobials:  Ceftriaxone 2/12 x1 Azithromycin 2/12 >>  Zosyn 2/13 >>   Interim history/subjective:  ET tube exchange yesterday without problem Remains on the ventilator.  Of pressors XRT canceled yesterday due to high peak pressures.  Objective   Blood pressure 111/72, pulse 81, temperature 98.7 F (37.1 C), temperature source Axillary, resp. rate (!) 0, height 6' (1.829 m), weight (!) 142.2 kg, SpO2 99 %.    Vent Mode: PRVC FiO2 (%):  [30 %-40 %] 40 % Set Rate:  [12 bmp] 12 bmp Vt Set:  [620 mL] 620 mL PEEP:  [5 cmH20] 5 cmH20 Plateau Pressure:  [9 cmH20-25 cmH20] 25 cmH20   Intake/Output Summary (Last 24 hours) at 02/08/2020 1004 Last data filed at 02/08/2020 0733 Gross per 24 hour  Intake 1964.97 ml  Output 1725 ml  Net 239.97 ml   Filed Weights   02/06/20 0500 02/07/20  2774 02/08/20 0202  Weight: (!) 141.9 kg (!) 141.7 kg (!) 142.2 kg    Examination: Gen:      No acute distress HEENT:  EOMI, sclera anicteric Neck:     No masses; no thyromegaly, ET tube Lungs:    Clear to auscultation bilaterally; normal respiratory  effort CV:         Regular rate and rhythm; no murmurs Abd:      + bowel sounds; soft, non-tender; no palpable masses, no distension Ext:    No edema; adequate peripheral perfusion Skin:      Warm and dry; no rash Neuro: Sedated, unresponsive  Resolved Hospital Problem list   Acute renal failure   Assessment & Plan:   Subcarinal/right hilar mass, almost certainly primary lung cancer, 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 -case reviewed with Neurosurgery, given no contrasted images and inability to determine actual size of mass vs contribution of hemorrhage. Dr. Nickolas Madrid ([place]) made aware of case as well to follow peripherally / for Tumor Board review, appreciate assistance. -underwent video bronch 2/16, EBUS arranged for 0730 2/26  -Remained intubated post bronchoscopy due to significant tumor presence  Plan: No endotracheal suctioning  If bleeding occurs advance ETT into left mainstem NOT RIGHT Continue ventilator support with lung protective strategies  Wean PEEP and FiO2 for sats greater than 90%. Head of bed elevated 30 degrees. Plateau pressures less than 30 cm H20.  Follow intermittent chest x-ray and ABG.   SAT/SBT as tolerated, mentation precludes extubation  Ensure adequate pulmonary hygiene  Follow cultures  VAP bundle in place  PAD protocol Continue XRT   Suspected postobstructive pneumonia with right lung collapse  Plan: Continue empiric antibiotics  Cultures negative Trend WBC and fever curve   Anemia HGB drop to 10.2 ( from 11.1)  + 4600 cc's>> ? Hemodilutional Plan Trend CBC Monitor for bleeding  Transfuse for HGB < 7  COPD Plan: Continue bronchodilators  Hold Pulmicort while on systemic steroids  Duonebs as needed  Consider adding Spiriva at discharge   New onset Atrial Fibrillation  -Developed during bronch  P: Continue Cardizem. Change to PO when enteral access is obtained Not using full anticoagulation due to  mets and risk of bleeding DC A-line  Acute metabolics Encephalopathy   -Suspect largely due to his left frontal lobe lesion.  Consider also contribution of metabolic status, his renal insufficiency.  EEG 2/13 reassuring.   Plan: Neurology following, appreciate assistance Seizure precautions  Continue AED, Keppra Decadron   Goals of care Plan: Ongoing discussion with family.  Currently DNR Palliative care on board.  Nutrition Plan: Place cortrack and start tube feeds.  Best practice:  Diet: N.p.o., start tube feed Pain/Anxiety/Delirium protocol (if indicated): Propofol, fentanyl VAP protocol (if indicated): Ordered DVT prophylaxis: SCDs, starting lovenox for prophylaxis GI prophylaxis: Protonix Glucose control:Monitor Mobility: Bed Code Status: Full Family Communication: Updated daily. Last updated wife on 2/20 Disposition: ICU  Critical care time:    The patient is critically ill with multiple organ system failure and requires high complexity decision making for assessment and support, frequent evaluation and titration of therapies, advanced monitoring, review of radiographic studies and interpretation of complex data.   Critical Care Time devoted to patient care services, exclusive of separately billable procedures, described in this note is 35 minutes.   [name] MD [place] [facility] Please see [email or web address] for pager details.  02/08/2020, 10:09 AM

## 2020-02-09 ENCOUNTER — Inpatient Hospital Stay (HOSPITAL_COMMUNITY): Payer: Medicare HMO

## 2020-02-09 LAB — CBC
HCT: 35 % — ABNORMAL LOW (ref 39.0–52.0)
Hemoglobin: 9.7 g/dL — ABNORMAL LOW (ref 13.0–17.0)
MCH: 25.6 pg — ABNORMAL LOW (ref 26.0–34.0)
MCHC: 27.7 g/dL — ABNORMAL LOW (ref 30.0–36.0)
MCV: 92.3 fL (ref 80.0–100.0)
Platelets: 184 10*3/uL (ref 150–400)
RBC: 3.79 MIL/uL — ABNORMAL LOW (ref 4.22–5.81)
RDW: 16.2 % — ABNORMAL HIGH (ref 11.5–15.5)
WBC: 13.7 10*3/uL — ABNORMAL HIGH (ref 4.0–10.5)
nRBC: 0 % (ref 0.0–0.2)

## 2020-02-09 LAB — BASIC METABOLIC PANEL
Anion gap: 3 — ABNORMAL LOW (ref 5–15)
BUN: 56 mg/dL — ABNORMAL HIGH (ref 8–23)
CO2: 27 mmol/L (ref 22–32)
Calcium: 8.7 mg/dL — ABNORMAL LOW (ref 8.9–10.3)
Chloride: 116 mmol/L — ABNORMAL HIGH (ref 98–111)
Creatinine, Ser: 1.16 mg/dL (ref 0.61–1.24)
GFR calc Af Amer: >60 mL/min (ref >60)
GFR calc non Af Amer: >60 mL/min (ref >60)
Glucose, Bld: 118 mg/dL — ABNORMAL HIGH (ref 70–99)
Potassium: 4.9 mmol/L (ref 3.5–5.1)
Sodium: 146 mmol/L — ABNORMAL HIGH (ref 135–145)

## 2020-02-09 LAB — GLUCOSE, CAPILLARY
Glucose-Capillary: 102 mg/dL — ABNORMAL HIGH (ref 70–99)
Glucose-Capillary: 108 mg/dL — ABNORMAL HIGH (ref 70–99)
Glucose-Capillary: 109 mg/dL — ABNORMAL HIGH (ref 70–99)
Glucose-Capillary: 110 mg/dL — ABNORMAL HIGH (ref 70–99)
Glucose-Capillary: 113 mg/dL — ABNORMAL HIGH (ref 70–99)
Glucose-Capillary: 115 mg/dL — ABNORMAL HIGH (ref 70–99)
Glucose-Capillary: 115 mg/dL — ABNORMAL HIGH (ref 70–99)

## 2020-02-09 LAB — PHOSPHORUS
Phosphorus: 3.5 mg/dL (ref 2.5–4.6)
Phosphorus: 3.9 mg/dL (ref 2.5–4.6)

## 2020-02-09 LAB — MAGNESIUM
Magnesium: 2.8 mg/dL — ABNORMAL HIGH (ref 1.7–2.4)
Magnesium: 2.6 mg/dL — ABNORMAL HIGH (ref 1.7–2.4)

## 2020-02-09 LAB — TRIGLYCERIDES: Triglycerides: 154 mg/dL — ABNORMAL HIGH (ref <150)

## 2020-02-09 IMAGING — DX DG CHEST 1V PORT
1 series · 2 of 2 positions shown · non-contrast
Comparison: Chest radiograph from one day prior.

CLINICAL DATA: Acute respiratory failure

EXAM:
PORTABLE CHEST 1 VIEW

[Series 1: chest ap · 0.14mm/px · 2 of 2 slices shown]
[im 1/2]
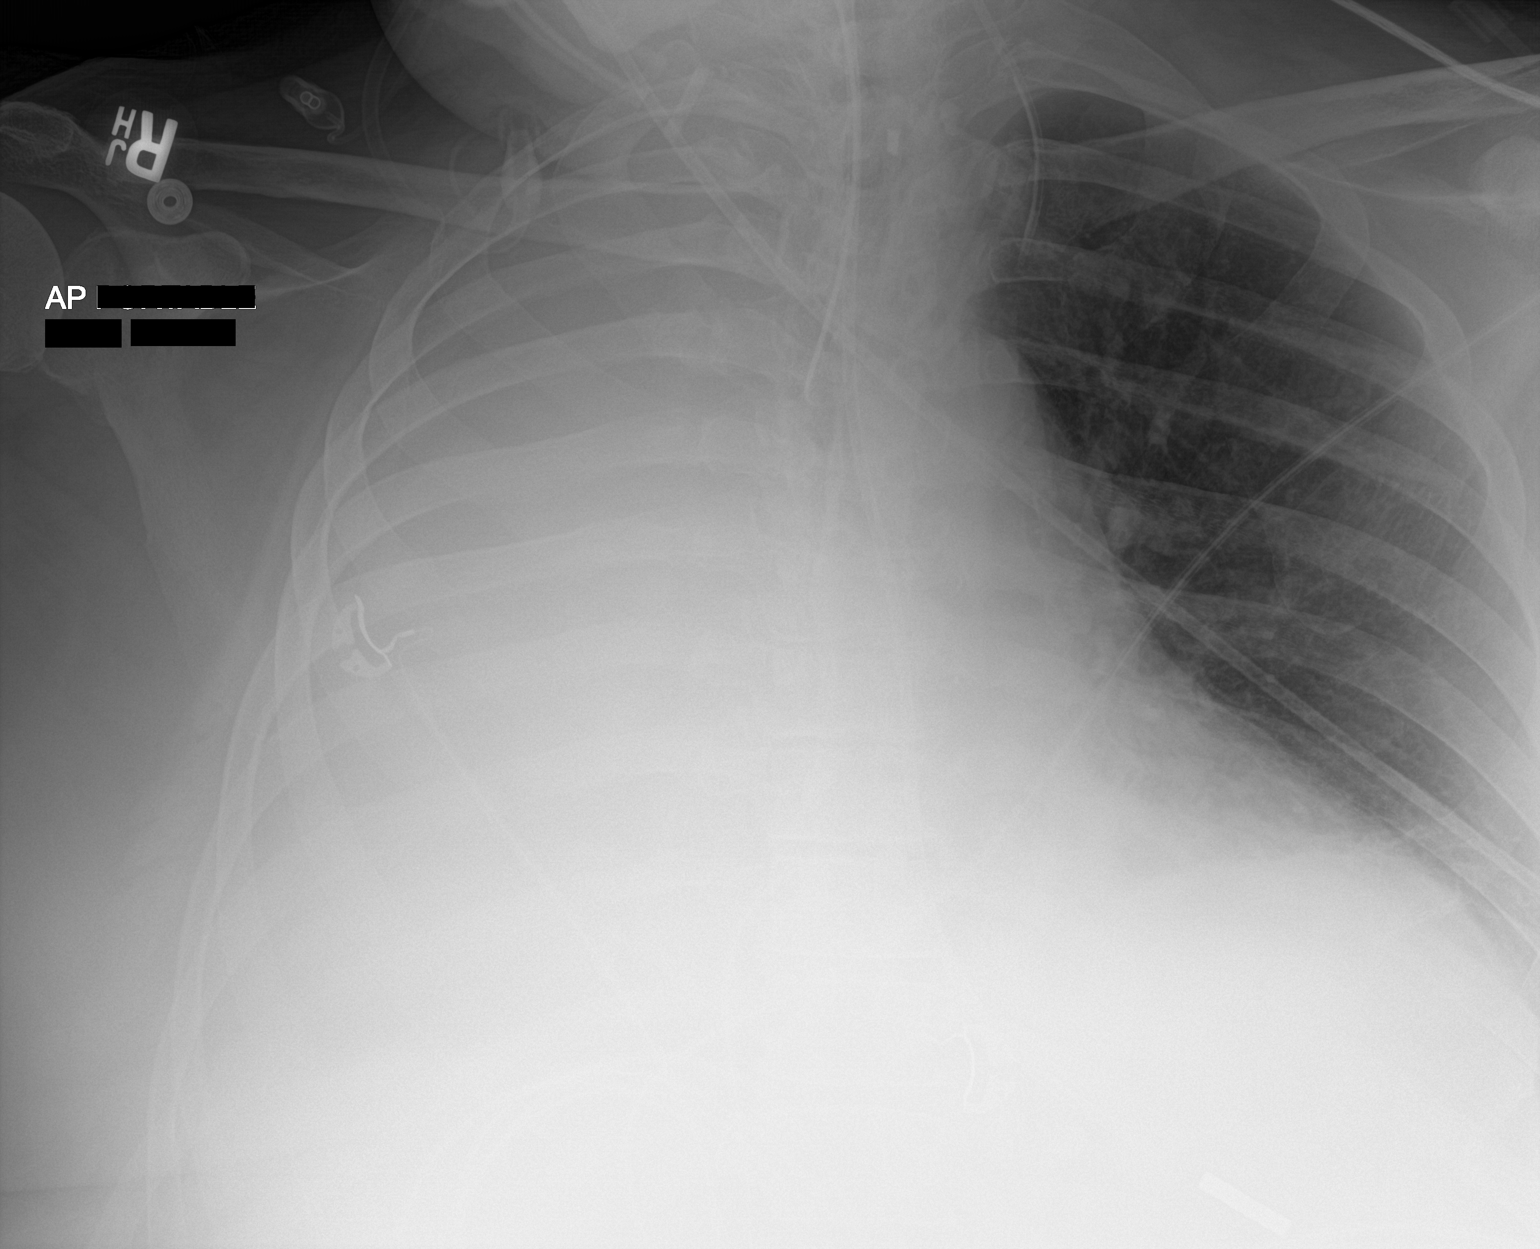
[im 2/2]
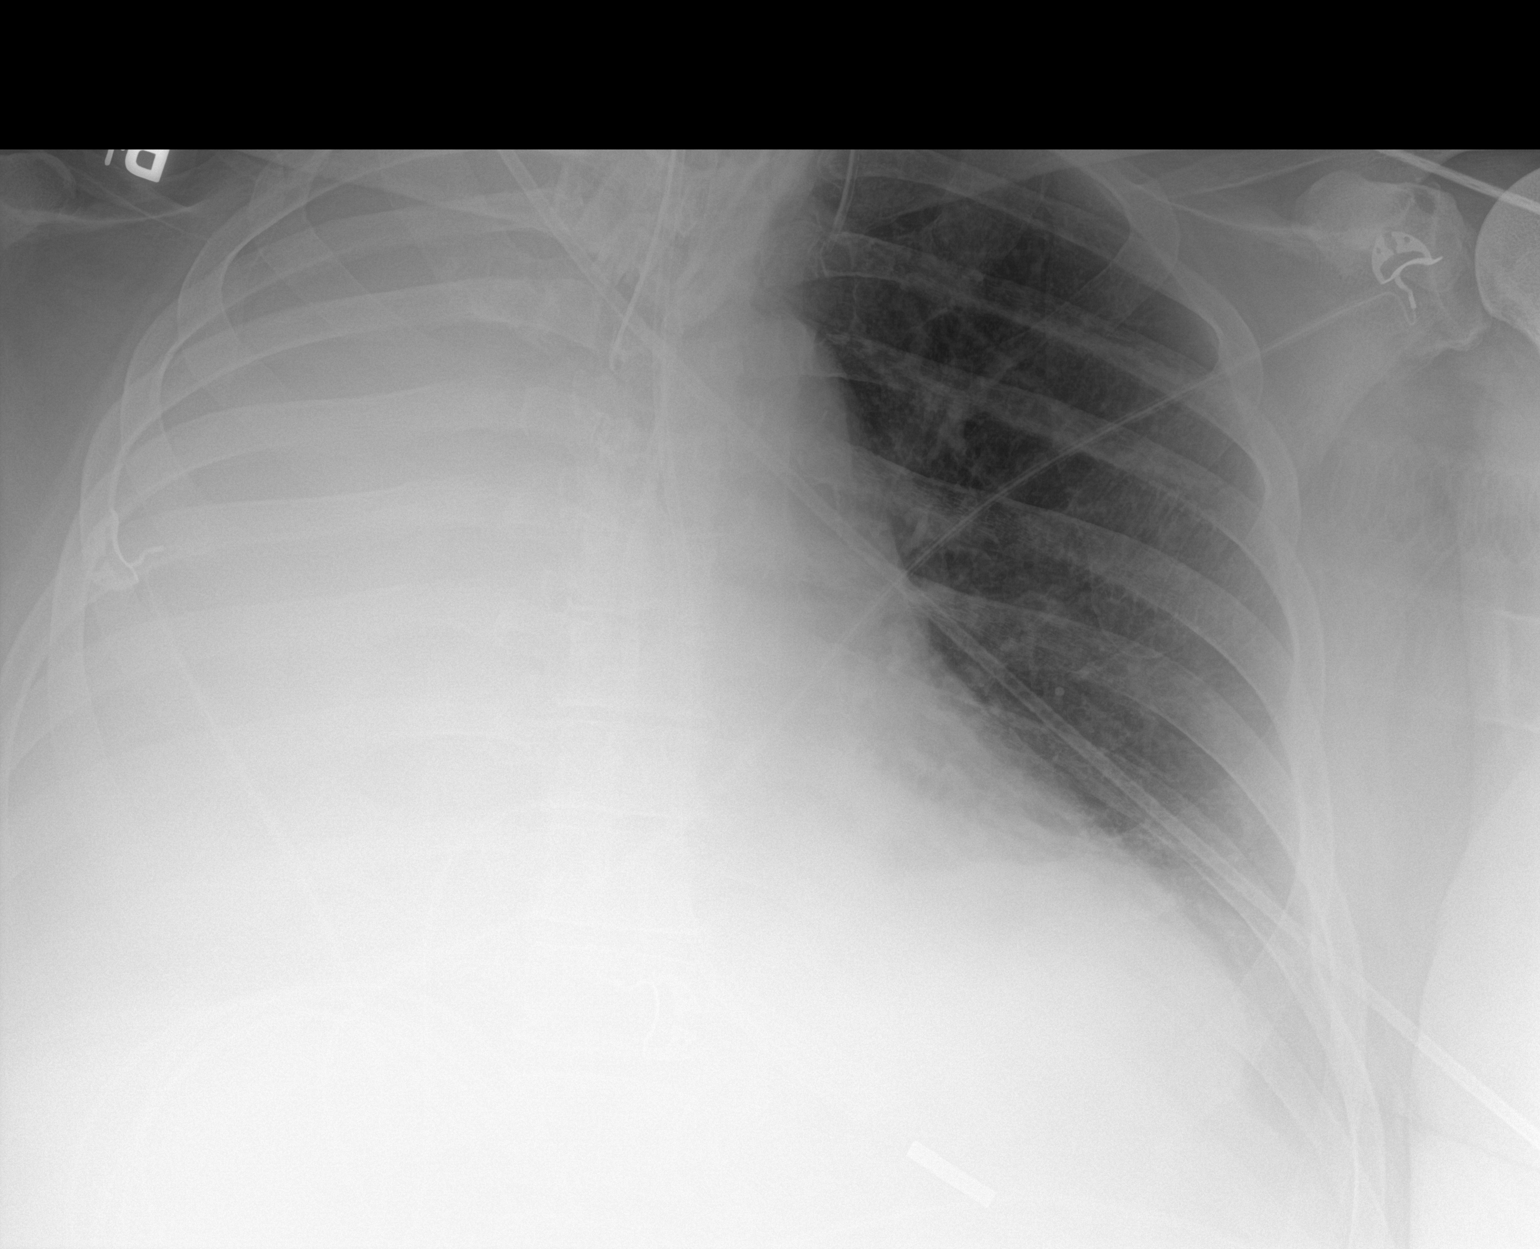

[2 of 2 positions shown; findings below may reference images not displayed]

FINDINGS: Endotracheal tube tip is 2.5 cm above the carina. Enteric tube
terminates in the proximal stomach. Left internal jugular central
venous catheter terminates in the high left mediastinum, unchanged.
Stable complete opacification of the right hemithorax. Stable mild
left basilar atelectasis. No pneumothorax. No left pleural effusion.
Stable obscured cardiomediastinal silhouette.
IMPRESSION: 1. Well-positioned support structures.
2. Stable complete opacification of the right hemithorax, compatible
with known central right lung mass, right lung atelectasis and
possible pleural effusion component.
3. Mild left basilar atelectasis.

## 2020-02-09 MED ORDER — METOPROLOL TARTRATE 5 MG/5ML IV SOLN
5.0000 mg | Freq: Four times a day (QID) | INTRAVENOUS | Status: DC
  Administered 2020-02-09 – 2020-02-11 (×7): 5 mg via INTRAVENOUS
  Filled 2020-02-09 (×7): qty 5

## 2020-02-09 MED ORDER — DILTIAZEM HCL 60 MG PO TABS
60.0000 mg | ORAL_TABLET | Freq: Four times a day (QID) | ORAL | Status: DC
  Administered 2020-02-09 – 2020-02-10 (×6): 60 mg via ORAL
  Filled 2020-02-09 (×6): qty 1

## 2020-02-09 NOTE — Progress Notes (Addendum)
Daughter Jackelyn Poling, called RN multiple times when RN was unavailable. RN called back and spoke with her, and updated. RN will continue to monitor.

## 2020-02-09 NOTE — Progress Notes (Addendum)
NAME:  Shane Avila, MRN:  [id number], DOB:  [birth year], LOS: 9 ADMISSION DATE:  01/27/2020, CONSULTATION DATE: 02/01/2020 REFERRING MD: Dr. Tyrell Antonio, CHIEF COMPLAINT: Subcarinal mass  Brief History   69 year old former smoker (110 pack years) with a history of COPD and chronic hypoxemic respiratory failure on chronic O2 at 2 L/min.  Also with hypertension, hyperlipidemia, atrial fibrillation and depression.  Admit with subcarinal, right hilar mass,brain lesion that was first discovered on CT chest 11/13/2019 at [place].  Biopsy was planned but has not yet been done.  Underwent Bronchoscopy 2/16 for obstructed right mainstem with Dr. Valeta Harms, with evidence of fully obstructed right lung from tumor originating from the medial portion of the main carina. During procedure patient developed Rapid A-fib with RVR. Tx to [facility] for XRT  If the patient was to have significant amount of bleeding from the endotracheal tube the endotracheal tube should be advanced into the left mainstem for unilateral left-sided lung ventilation.  Do NOT ADVANCE the endotracheal tube into the right mainstem   Past Medical History  COPD Chronic hypoxemic respiratory failure Hypertension Hyperlipidemia Atrial fibrillation Depression COVID-19 pneumonia in October 2020, did not require hospital admission  [place] [facility] Events   2/12 Admit  2/16 Bronch with diagnosis of Sq celll cancer, tx to [facility] for XRT 2/19 ETT tube exchange due to blown cuff  Consults:  Neurology PCCM Radiation oncology  Procedures:  Bronchoscopy 2/16, 2/19 ETT 2/16 >> Lt IJ CVL 2/16 >> Lt radial A aline 2/16 > 2/19  Significant Diagnostic Tests:  CT chest Oval [place]) 01/30/2020 >> 5.1 x 4.8 cm subcarinal mass that impacts the main carina and extrinsically compresses the right mainstem bronchus, right upper lobe airway and bronchus intermedius.  Unclear whether there is an endobronchial lesion.  Significant right lower lobe volume  loss/atelectasis/collapse vs post-obstructive PNA.  Small right effusion MRI Brain w/o 2/12 >> approximate 33mm cortically based hemorrhagic mass inovlving the anterior left frontal lobe, mild vasogenic edema w/o significant regional mass effect MRI Brain w/o 2/13 >> motion degraded exam, unchanged 14 mm cortically based hemorrhagic mass   Micro Data:  SARSCov2 2/12 >> negative Blood 2/12 >> negative  Respiratory 2/12 >>   Antimicrobials:  Ceftriaxone 2/12 x1 Azithromycin 2/12 >>  Zosyn 2/13 >>   Interim history/subjective:  No acute events overnight.  Remains on the ventilator, off pressors  Objective   Blood pressure 94/60, pulse 85, temperature (!) 97.4 F (36.3 C), temperature source Axillary, resp. rate 12, height 6' (1.829 m), weight (!) 144.3 kg, SpO2 92 %.    Vent Mode: PRVC FiO2 (%):  [40 %-50 %] 50 % Set Rate:  [12 bmp] 12 bmp Vt Set:  [502 mL] 620 mL PEEP:  [5 [id number]] 094 cmH20 Plateau Pressure:  [20 cmH20-25 cmH20] 21 cmH20   Intake/Output Summary (Last 24 hours) at 02/09/2020 7096 Last data filed at 02/09/2020 0800 Gross per 24 hour  Intake 2617.47 ml  Output 1210 ml  Net 1407.47 ml   Filed Weights   02/07/20 0452 02/08/20 0202 02/09/20 0500  Weight: (!) 141.7 kg (!) 142.2 kg (!) 144.3 kg    Examination: Gen:      Chronically ill HEENT:  EOMI, sclera anicteric Neck:     No masses; no thyromegaly, ETT Lungs:    Clear to auscultation bilaterally; normal respiratory effort CV:         Irregular Abd:      + bowel sounds; soft, non-tender; no palpable masses, no distension Ext:  No edema; adequate peripheral perfusion Skin:      Warm and dry; no rash Neuro: Sedated, unresponsive  Resolved Hospital Problem list   Acute renal failure   Assessment & Plan:   Subcarinal/right hilar mass, almost certainly primary lung cancer, 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 -case reviewed with Neurosurgery, given no contrasted images  and inability to determine actual size of mass vs contribution of hemorrhage. Dr. Nickolas Madrid ([facility]) made aware of case as well to follow peripherally / for Tumor Board review, appreciate assistance. -underwent video bronch 2/16, EBUS arranged for 0730 2/26  -Remained intubated post bronchoscopy due to significant tumor presence  Plan: If bleeding occurs advance ETT into left mainstem NOT RIGHT Continue ventilator support with lung protective strategies  Continue XRT   Suspected postobstructive pneumonia with right lung collapse  Plan: Continue empiric antibiotics  Cultures negative Trend WBC and fever curve   Anemia HGB drop to 10.2 ( from 11.1)  + 4600 cc's>> ? Hemodilutional Plan Trend CBC Monitor for bleeding  Transfuse for HGB < 7  COPD Plan: Continue bronchodilators  Hold Pulmicort while on systemic steroids  Duonebs as needed   New onset Atrial Fibrillation  -Developed during bronch  P: Continue Cardizem.  Change to p.o. and take off drip Continue bystolic Not using full anticoagulation due to mets and risk of bleeding  Acute metabolics Encephalopathy   -Suspect largely due to his left frontal lobe lesion.  Consider also contribution of metabolic status, his renal insufficiency.  EEG 2/13 reassuring.   Plan: Neurology following, appreciate assistance Seizure precautions  Continue AED, Keppra Finished Decadron   Goals of care Plan: Ongoing discussion with family.  Currently DNR Palliative care on board.  Nutrition Plan: Tube feebds  Best practice:  Diet: Tube feebds Pain/Anxiety/Delirium protocol (if indicated): Propofol, fentanyl VAP protocol (if indicated): Ordered DVT prophylaxis: SCDs, Lovenox GI prophylaxis: Protonix Glucose control:Monitor Mobility: Bed Code Status: Full Family Communication: Updated daily. Last updated wife and daughter Jackelyn Poling on 2/21. Disposition: ICU  Critical care time:    The patient is critically ill with multiple  organ system failure and requires high complexity decision making for assessment and support, frequent evaluation and titration of therapies, advanced monitoring, review of radiographic studies and interpretation of complex data.   Critical Care Time devoted to patient care services, exclusive of separately billable procedures, described in this note is 35 minutes.   [name] MD [facility] Please see [email or web address] for pager details.  02/09/2020, 9:37 AM

## 2020-02-10 ENCOUNTER — Ambulatory Visit
Admit: 2020-02-10 | Discharge: 2020-02-10 | Disposition: A | Discharge status: Home / Self Care | Payer: Medicare HMO | Attending: Radiation Oncology | Admitting: Radiation Oncology

## 2020-02-10 ENCOUNTER — Inpatient Hospital Stay (HOSPITAL_COMMUNITY): Payer: Medicare HMO

## 2020-02-10 ENCOUNTER — Telehealth: Payer: Self-pay | Admitting: Radiation Oncology

## 2020-02-10 DIAGNOSIS — R579 Shock, unspecified: Secondary | ICD-10-CM

## 2020-02-10 DIAGNOSIS — J9690 Respiratory failure, unspecified, unspecified whether with hypoxia or hypercapnia: Secondary | ICD-10-CM

## 2020-02-10 DIAGNOSIS — J9601 Acute respiratory failure with hypoxia: Secondary | ICD-10-CM

## 2020-02-10 LAB — BASIC METABOLIC PANEL
Anion gap: 6 (ref 5–15)
BUN: 59 mg/dL — ABNORMAL HIGH (ref 8–23)
CO2: 24 mmol/L (ref 22–32)
Calcium: 8.8 mg/dL — ABNORMAL LOW (ref 8.9–10.3)
Chloride: 116 mmol/L — ABNORMAL HIGH (ref 98–111)
Creatinine, Ser: 1.04 mg/dL (ref 0.61–1.24)
GFR calc Af Amer: >60 mL/min (ref >60)
GFR calc non Af Amer: >60 mL/min (ref >60)
Glucose, Bld: 112 mg/dL — ABNORMAL HIGH (ref 70–99)
Potassium: 4.4 mmol/L (ref 3.5–5.1)
Sodium: 146 mmol/L — ABNORMAL HIGH (ref 135–145)

## 2020-02-10 LAB — CBC
HCT: 36.4 % — ABNORMAL LOW (ref 39.0–52.0)
Hemoglobin: 10.6 g/dL — ABNORMAL LOW (ref 13.0–17.0)
MCH: 25.8 pg — ABNORMAL LOW (ref 26.0–34.0)
MCHC: 29.1 g/dL — ABNORMAL LOW (ref 30.0–36.0)
MCV: 88.6 fL (ref 80.0–100.0)
Platelets: ADEQUATE 10*3/uL (ref 150–400)
RBC: 4.11 MIL/uL — ABNORMAL LOW (ref 4.22–5.81)
RDW: 16.6 % — ABNORMAL HIGH (ref 11.5–15.5)
WBC: 18 10*3/uL — ABNORMAL HIGH (ref 4.0–10.5)
nRBC: 0.2 % (ref 0.0–0.2)

## 2020-02-10 LAB — TRIGLYCERIDES: Triglycerides: 105 mg/dL (ref <150)

## 2020-02-10 LAB — GLUCOSE, CAPILLARY
Glucose-Capillary: 106 mg/dL — ABNORMAL HIGH (ref 70–99)
Glucose-Capillary: 106 mg/dL — ABNORMAL HIGH (ref 70–99)
Glucose-Capillary: 125 mg/dL — ABNORMAL HIGH (ref 70–99)
Glucose-Capillary: 134 mg/dL — ABNORMAL HIGH (ref 70–99)
Glucose-Capillary: 141 mg/dL — ABNORMAL HIGH (ref 70–99)
Glucose-Capillary: 98 mg/dL (ref 70–99)

## 2020-02-10 LAB — PHOSPHORUS: Phosphorus: 2.7 mg/dL (ref 2.5–4.6)

## 2020-02-10 LAB — MAGNESIUM: Magnesium: 2.4 mg/dL (ref 1.7–2.4)

## 2020-02-10 IMAGING — DX DG CHEST 1V PORT
1 series · 1 of 1 positions shown · non-contrast
Comparison: Radiograph [DATE]

CLINICAL DATA: Acute respiratory failure

EXAM:
PORTABLE CHEST 1 VIEW

[chest ap]
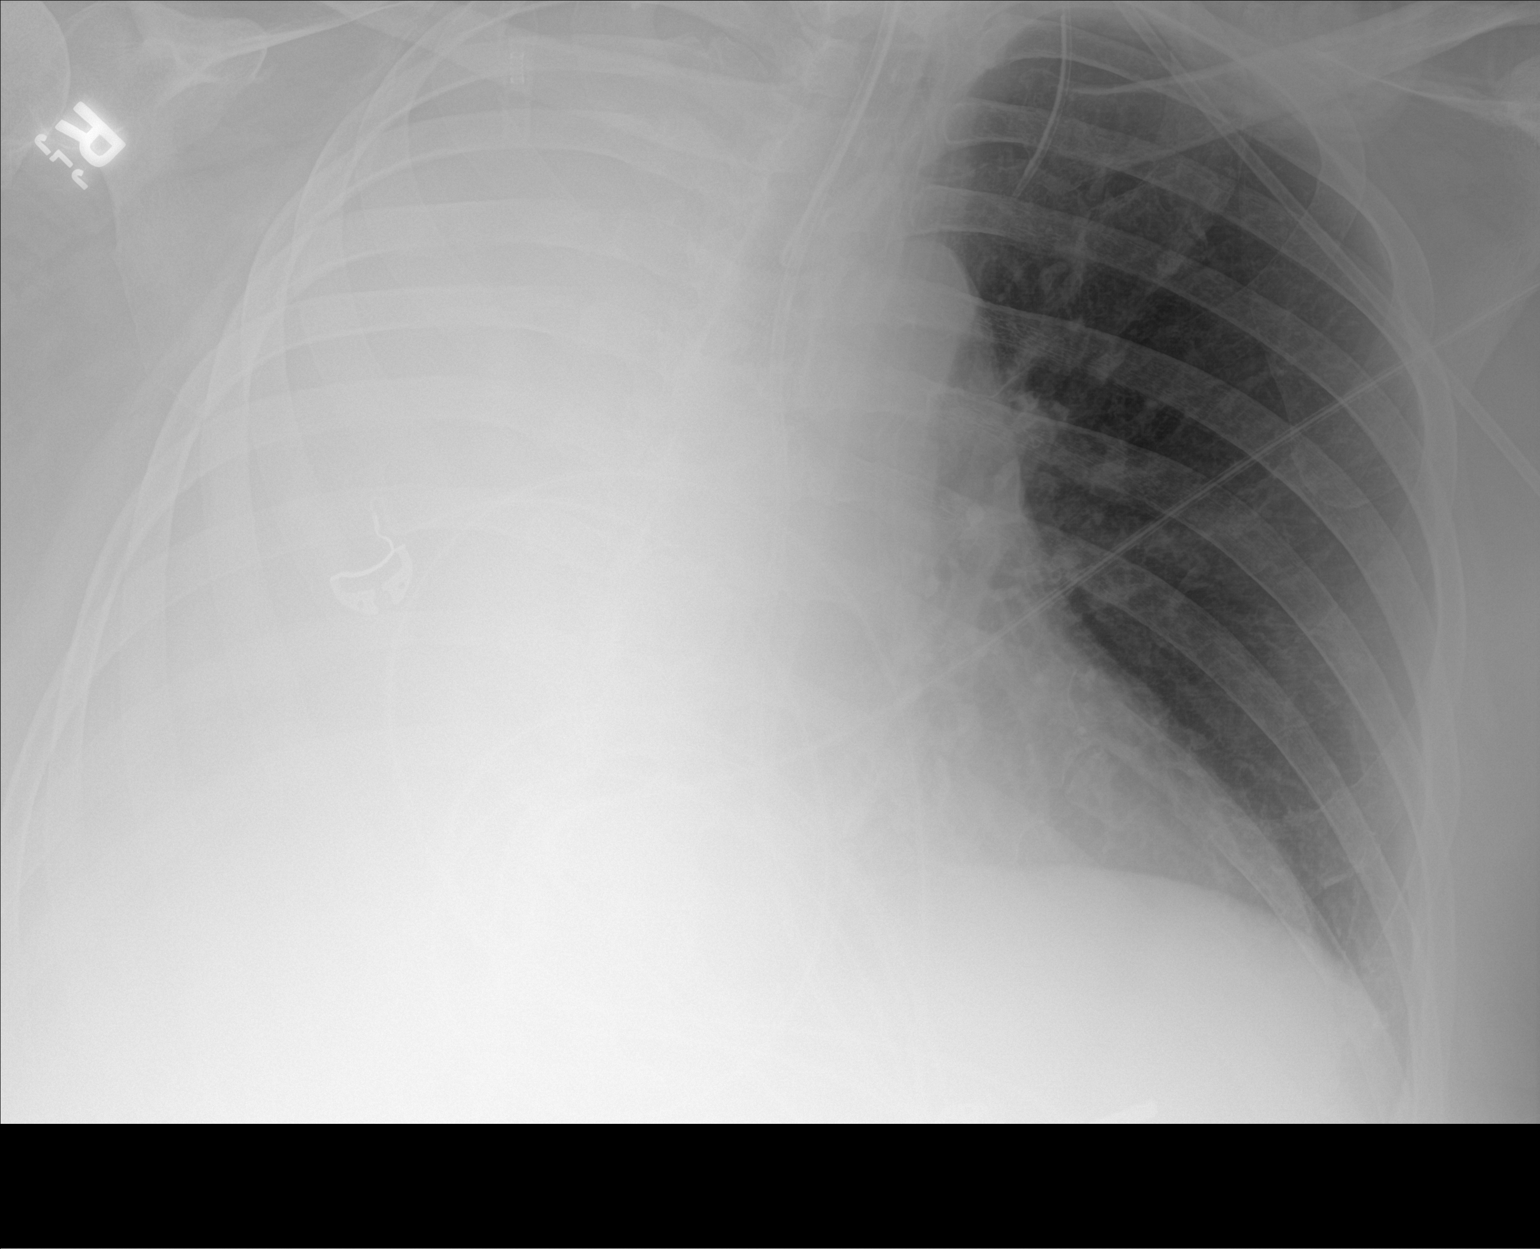

[1 of 1 positions shown; findings below may reference images not displayed]

FINDINGS: Endotracheal tube terminates in the mid to lower trachea, 3 cm from
the carina. Transesophageal tube tip is positioned in the left upper
quadrant. Left IJ catheter terminates within the left mediastinum,
likely within the brachiocephalic vein. Complete opacification of
the right hemithorax is again noted. Atelectatic changes are present
in the left base. Visual cardiac contours are unchanged from prior.
No acute osseous or soft tissue abnormality.
IMPRESSION: 1. Stable complete opacification of the right hemithorax. Left
basilar atelectasis.
2. Left IJ catheter within the brachiocephalic vein.
3. Endotracheal and transesophageal tubes are in stable satisfactory
position.

## 2020-02-10 MED ORDER — HYDROCORTISONE NA SUCCINATE PF 100 MG IJ SOLR
100.0000 mg | Freq: Four times a day (QID) | INTRAMUSCULAR | Status: DC
  Administered 2020-02-10 – 2020-02-12 (×8): 100 mg via INTRAVENOUS
  Filled 2020-02-10 (×8): qty 2

## 2020-02-10 MED ORDER — DILTIAZEM HCL 60 MG PO TABS
60.0000 mg | ORAL_TABLET | Freq: Four times a day (QID) | ORAL | Status: DC
  Administered 2020-02-10 – 2020-02-15 (×18): 60 mg
  Filled 2020-02-10 (×20): qty 1

## 2020-02-10 MED ORDER — BUDESONIDE 0.5 MG/2ML IN SUSP
0.5000 mg | Freq: Two times a day (BID) | RESPIRATORY_TRACT | Status: DC
  Administered 2020-02-10 – 2020-02-13 (×7): 0.5 mg via RESPIRATORY_TRACT
  Filled 2020-02-10 (×7): qty 2

## 2020-02-10 MED ORDER — FOLIC ACID 1 MG PO TABS
1.0000 mg | ORAL_TABLET | Freq: Every day | ORAL | Status: DC
  Administered 2020-02-11 – 2020-02-16 (×6): 1 mg
  Filled 2020-02-10 (×6): qty 1

## 2020-02-10 MED ORDER — METOPROLOL TARTRATE 5 MG/5ML IV SOLN
2.5000 mg | INTRAVENOUS | Status: DC | PRN
  Administered 2020-02-10: 5 mg via INTRAVENOUS
  Administered 2020-02-10 – 2020-02-11 (×3): 2.5 mg via INTRAVENOUS
  Administered 2020-02-15: 13:00:00 5 mg via INTRAVENOUS
  Administered 2020-02-15 (×2): 2.5 mg via INTRAVENOUS
  Filled 2020-02-10 (×6): qty 5

## 2020-02-10 MED ORDER — PRO-STAT SUGAR FREE PO LIQD
60.0000 mL | Freq: Four times a day (QID) | ORAL | Status: DC
  Administered 2020-02-10 – 2020-02-16 (×24): 60 mL
  Filled 2020-02-10 (×23): qty 60

## 2020-02-10 MED ORDER — LEVALBUTEROL HCL 0.63 MG/3ML IN NEBU
INHALATION_SOLUTION | RESPIRATORY_TRACT | Status: AC
  Filled 2020-02-10: qty 3

## 2020-02-10 MED ORDER — LEVALBUTEROL HCL 0.63 MG/3ML IN NEBU
0.6300 mg | INHALATION_SOLUTION | Freq: Four times a day (QID) | RESPIRATORY_TRACT | Status: DC
  Administered 2020-02-10 – 2020-02-16 (×24): 0.63 mg via RESPIRATORY_TRACT
  Filled 2020-02-10 (×22): qty 3

## 2020-02-10 MED ORDER — ATORVASTATIN CALCIUM 40 MG PO TABS
40.0000 mg | ORAL_TABLET | Freq: Every day | ORAL | Status: DC
  Administered 2020-02-10 – 2020-02-13 (×4): 40 mg
  Filled 2020-02-10 (×3): qty 1

## 2020-02-10 MED ORDER — VITAMIN B-12 1000 MCG PO TABS
1000.0000 ug | ORAL_TABLET | Freq: Every day | ORAL | Status: DC
  Administered 2020-02-11 – 2020-02-16 (×6): 1000 ug
  Filled 2020-02-10 (×6): qty 1

## 2020-02-10 MED ORDER — PIVOT 1.5 CAL PO LIQD
1000.0000 mL | ORAL | Status: AC
  Administered 2020-02-10 – 2020-02-14 (×2): 1000 mL
  Filled 2020-02-10 (×7): qty 1000

## 2020-02-10 NOTE — Telephone Encounter (Signed)
Received message from therapist, Threasa Beards, on L2. She explains the patient's family is calling asking questions about radiation therapy and if its working. Phoned patient's daughter, Katharine Look, to offer answers. Spoke with Katharine Look and patient's granddaughter, [name], over speaker phone. They inquire if the radiation is working. Explained the patient has only received three treatments thus far and more treatments are required before determining response to treatment. Family states, "a doctor made rounds and said its still not opened up." Explained this doesn't mean the tumor isn't responding to treatment." Answered all questions to the best of my ability. Daughter and granddaughter confirm their desire for the patient to continue treatment as planned. Both endorses speaking with palliative care earlier this morning and having a good understanding of the patient's prognosis. Provided patient with my direct contact number and encouraged them to call with future needs. Both verbalized understanding of all reviewed.

## 2020-02-10 NOTE — Progress Notes (Signed)
Spoke with Alroy Dust, RN caring for patient in ICU. He reports the patient is stable at the moment. He understands the plan is to treat the patient with radiation therapy today at 1445. I explained our radiation transporter Nicki Reaper will be up to assist with transport down to radiation therapy around 1430 today. Alroy Dust, RN knows he can contact this RN directly with any changes in patient status at [phone]. Communicated plan to [place], RT on L2. Understanding verbalized by all.

## 2020-02-10 NOTE — Progress Notes (Signed)
PT Cancellation Note  Patient Details Name: Shane Avila MRN: [id number] DOB: 12/20/1950   Cancelled Treatment:    Reason Eval/Treat Not Completed: Patient not medically ready--currently on vent. Will sign off.    Doreatha Massed, PT Acute Rehabilitation

## 2020-02-10 NOTE — Progress Notes (Signed)
Daily Progress Note   Patient Name: Shane Avila       Date: 02/10/2020 DOB: April 01, 1951  Age: 69 y.o. MRN#: [id number] Attending Physician: [name], MD Primary Care Physician: Bonnita Nasuti, MD Admit Date: 02/01/2020  Reason for Consultation/Follow-up: Establishing goals of care  Subjective:  remains on the vent, no family at bedside, call placed and discussed with wife and daughter [name]. Also discussed with nursing staff.    Length of Stay: 10  Current Medications: Scheduled Meds:  . acetaminophen  1,000 mg Oral Once  . atorvastatin  40 mg Oral QHS  . budesonide (PULMICORT) nebulizer solution  0.5 mg Nebulization BID  . chlorhexidine gluconate (MEDLINE KIT)  15 mL Mouth Rinse BID  . Chlorhexidine Gluconate Cloth  6 each Topical Daily  . diltiazem  60 mg Oral Q6H  . feeding supplement (PRO-STAT SUGAR FREE 64)  60 mL Per Tube QID  . folic acid  1 mg Oral Daily  . hydrocortisone sodium succinate  100 mg Intravenous Q6H  . levalbuterol      . levalbuterol  0.63 mg Nebulization Q6H  . mouth rinse  15 mL Mouth Rinse 10 times per day  . metoprolol tartrate  5 mg Intravenous Q6H  . mupirocin cream   Topical Daily  . sodium chloride flush  10-40 mL Intracatheter Q12H  . vitamin B-12  1,000 mcg Oral Daily    Continuous Infusions: . diltiazem (CARDIZEM) infusion 15 mg/hr (02/10/20 0900)  . feeding supplement (PIVOT 1.5 CAL)    . fentaNYL infusion INTRAVENOUS 125 mcg/hr (02/10/20 0900)  . levETIRAcetam Stopped (02/09/20 2133)  . phenylephrine 70 mcg/min (02/10/20 1115)  . propofol (DIPRIVAN) infusion 10 mcg/kg/min (02/10/20 0900)    PRN Meds: acetaminophen **OR** acetaminophen, docusate, fentaNYL, metoprolol tartrate, ondansetron **OR** ondansetron (ZOFRAN) IV, sodium  chloride flush  Physical Exam         Remains on vent Sedated Abdomen appears distended Has trace generalized edema Monitor noted.   Vital Signs: BP 120/76   Pulse 61   Temp 98.1 F (36.7 C) (Axillary)   Resp 14   Ht 6' (1.829 m)   Wt (!) 144.8 kg   SpO2 100%   BMI 43.29 kg/m  SpO2: SpO2: 100 % O2 Device: O2 Device: Ventilator O2 Flow Rate: O2 Flow Rate (L/min): 2 L/min  Intake/output summary:   Intake/Output Summary (Last 24 hours) at 02/10/2020 1203 Last data filed at 02/10/2020 1109 Gross per 24 hour  Intake 2616.55 ml  Output 1525 ml  Net 1091.55 ml   LBM: Last BM Date: 01/30/20 Baseline Weight: Weight: (!) 142.6 kg Most recent weight: Weight: (!) 144.8 kg       Palliative Assessment/Data:      Patient Active Problem List   Diagnosis Date Noted  . Acute respiratory failure with hypoxemia ([place]) 02/10/2020  . Shock circulatory ([place]) 02/10/2020  . History of ETT   . Brain mass 02/01/2020  . Acute encephalopathy 02/01/2020  . 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 AKI (acute kidney injury) ([place]) 02/07/2020  . Primary cancer of right lower lobe of lung ([place]) 02/15/2020  . Lobar pneumonia ([place]) 02/01/2020    Palliative Care Assessment & Plan   Patient Profile:  69 yo gentleman with COPD, HTN HLD, A fib and depression. History of COVID infection in October 2020. Found to have a lung mass, brain mets, right lung occlusion. S/p bronchoscopy on 2/16 and transfer to [facility] for initiation of XRT.  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 Acute vent dependent hypoxic resp failure Suspected R lung collapse, postobstructive PNA.  MRI brain with hemorrhagic mass  Recommendations/Plan:   Patient to undergo radiation today Remains on the vent, continue current mode of care.  Call placed and discussed with wife and daughter [name]: reviewed with daughter [name] as to  why palliative care has been consulted and why PMT is following. Discussed frankly but compassionately that the patient is up against a lot of things. Patient is to undergo radiation today. He remains on the vent. We talked about the "what-ifs": if patient isn't able to be hemodynamically stable enough to tolerate radiation attempts, if he isn't able to be started on weaning trials. Daughter [name] becomes tearful, she states family is aware of how sick the patient is. We gently discussed about comfort measures, one way extubation and residential hospice and how that will be accomplished if indeed it becomes necessary in the near future.   For now, family remains hopeful for some degree of stabilization/recovery. Offered active listening and supportive presence, as much as is possible over the telephone. PMT will follow along. Patient lives with wife in [place], [place] and daughters also live locally.   Code Status:    Code Status Orders  (From admission, onward)         Start     Ordered   02/06/20 1141  Do not attempt resuscitation (DNR)  Continuous    Question Answer Comment  In the event of cardiac or respiratory ARREST Do not call a "code blue"   In the event of cardiac or respiratory ARREST Do not perform Intubation, CPR, defibrillation or ACLS   In the event of cardiac or respiratory ARREST Use medication by any route, position, wound care, and other measures to relive pain and suffering. May use oxygen, suction and manual treatment of airway obstruction as needed for comfort.      02/06/20 1140        Code Status History    Date Active Date Inactive Code Status Order ID Comments User Context   01/26/2020 1720 02/06/2020 1140 Full Code [id number]  Elmarie Shiley, MD Inpatient   Advance Care Planning Activity      Prognosis:  guarded   Discharge  Planning: To Be Determined: patient's family hopes for home with hospice support on discharge if it were to be possible.   Care plan was  discussed with  Wife, daughter [name] on the phone, also discussed with Juleen China from ICU.   Thank you for allowing the Palliative Medicine Team to assist in the care of this patient.   Time In: 11 Time Out: 11.35 Total Time 35 Prolonged Time Billed No       Greater than 50%  of this time was spent counseling and coordinating care related to the above assessment and plan.  Loistine Chance, MD  Please contact Palliative Medicine Team phone at [phone] for questions and concerns.

## 2020-02-10 NOTE — Progress Notes (Signed)
Received referral from RN.   Contacted pt spouse, Katharine Look.  Provided support via phone.  She requests spiritual care pray with pt daily.    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    This chaplain will offer prayers at bedside and forward to other spiritual care providers as well.    Jerene Pitch, MDiv, [facility]

## 2020-02-10 NOTE — Progress Notes (Addendum)
NAME:  Shane Avila, MRN:  [id number], DOB:  July 09, 1951, LOS: 34 ADMISSION DATE:  02/02/2020, CONSULTATION DATE: 02/01/2020 REFERRING MD: Dr. Tyrell Antonio, CHIEF COMPLAINT: Subcarinal mass  Brief History   69 year old former smoker (110 pack years) with a history of COPD and chronic hypoxemic respiratory failure on chronic O2 at 2 L/min.  Also with hypertension, hyperlipidemia, atrial fibrillation and depression.  He apparently is undergoing work-up for a subcarinal, right hilar mass that was first discovered on CT chest 11/13/2019 at [place].  Biopsy was planned but has not yet been done. He was evaluated in the ED at [facility] 2/11 for confusion and "bizarre behavior".  CT head revealed a left frontal small hyperdense area, question SAH versus mass.  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  His other work-up revealed acute renal failure (improving).   Underwent Bronchoscopy 2/16 for obstructed right mainstem with Dr. Valeta Harms, with evidence of fully obstructed right lung from tumor originating from the medial portion of the main carina. During procedure patient developed Rapid A-fib with RVR.   If the patient was to have significant amount of bleeding from the endotracheal tube the endotracheal tube should be advanced into the left mainstem for unilateral left-sided lung ventilation.  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  Unclear whether there is an endobronchial lesion.  Significant right lower lobe volume loss/atelectasis/collapse vs post-obstructive PNA.  Small right effusion MRI Brain w/o 2/12 >> approximate 77m cortically based hemorrhagic mass inovlving the anterior left frontal lobe, mild vasogenic edema w/o significant regional mass effect MRI Brain w/o 2/13 >> motion degraded exam, unchanged 14 mm cortically based hemorrhagic mass   EEG 2/13  wnl  Micro Data:  SARSCov2 2/12 >> negative Blood 2/12 >> negative  Respiratory 2/12 >> never sent  MRSA PCR 2/17 >>> neg  Antimicrobials:  Ceftriaxone 2/12 x1 Azithromycin 2/12 > 2/20  Zosyn 2/13 >  2/22   Scheduled Meds: . acetaminophen  1,000 mg Oral Once  . arformoterol  15 mcg Nebulization BID  . atorvastatin  40 mg Oral QHS  . chlorhexidine gluconate (MEDLINE KIT)  15 mL Mouth Rinse BID  . Chlorhexidine Gluconate Cloth  6 each Topical Daily  . diltiazem  60 mg Oral Q6H  . feeding supplement (PRO-STAT SUGAR FREE 64)  60 mL Per Tube QID  . folic acid  1 mg Oral Daily  . mouth rinse  15 mL Mouth Rinse 10 times per day  . metoprolol tartrate  5 mg Intravenous Q6H  . mupirocin cream  Topical Daily  . sodium chloride flush  10-40 mL Intracatheter Q12H  . vitamin B-12  1,000 mcg Oral Daily   Continuous Infusions: . diltiazem (CARDIZEM) infusion 15 mg/hr (02/10/20 0900)  . feeding supplement (PIVOT 1.5 CAL)    . fentaNYL infusion INTRAVENOUS 125 mcg/hr (02/10/20 0900)  . levETIRAcetam Stopped (02/09/20 2133)  . phenylephrine  70 mcg/min (02/10/20 0900)  . piperacillin-tazobactam (ZOSYN)  IV 3.375 g (02/10/20 0929)  . propofol (DIPRIVAN) infusion 10 mcg/kg/min (02/10/20 0900)   PRN Meds:.acetaminophen **OR** acetaminophen, docusate, fentaNYL, ipratropium-albuterol, ondansetron **OR** ondansetron (ZOFRAN) IV, sodium chloride flush   Interim history/subjective:  Sedated on vent/ RAF still an issue despite max cardizem drip and added BB  IV though note already on bystolic (BB without much effect on HR)  Newly restarted on neo since pm 2/21   Objective   Blood pressure 102/67, pulse (!) 51, temperature 98.1 F (36.7 C), temperature source Axillary, resp. rate 15, height 6' (1.829 m), weight (!) 144.8 kg, SpO2 100 %.    Vent Mode: PRVC FiO2 (%):  [50 %] 50 % Set Rate:  [12 bmp] 12 bmp Vt Set:  [620 mL] 620 mL PEEP:  [5 cmH20] 5 cmH20 Plateau Pressure:  [18 cmH20-24 cmH20] 18 cmH20   Intake/Output Summary (Last 24 hours) at 02/10/2020 1027 Last data filed at 02/10/2020 0900 Gross per 24 hour  Intake 2859.76 ml  Output 1525 ml  Net 1334.76 ml   Filed Weights   02/08/20 0202 02/09/20 0500 02/10/20 0500  Weight: (!) 142.2 kg (!) 144.3 kg (!) 144.8 kg    Examination: Pt sedated/ hob at 30 degrees up  No jvd Oropharynx et Neck supple Lungs with coarse  rhonchi bilaterally R>> L  IRIR apical pulse 100 - 130  Abd obese with poor  excursion  Extr warm with trace pitting edema  noted    pCXR 02/10/2020  1. Stable complete opacification of the right hemithorax. Left basilar atelectasis. 2. Left IJ catheter within the brachiocephalic vein. 3. Endotracheal and transesophageal tubes are in stable satisfactory Position.    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 -case reviewed with Neurosurgery, given no contrasted images and inability to determine actual size of mass vs  contribution of hemorrhage. Dr. Nickolas Madrid ([place]) made aware of case as well to follow peripherally / for Tumor Board review, appreciate assistance. -underwent video bronch 2/16  -Remained intubated post bronchoscopy due to significant tumor presence  Plan: Minimal  endotracheal suctioning - ok to suction the et to just beyond the level of the end (reviewed with RT) If bleeding occurs advance ETT into left mainstem NOT RIGHT Dex d/c'd 2/20  RT planned pm 2/22  Acute respiratory failure with hypoxemia   Wean PEEP and FiO2 for sats greater than 90%.per ards protocol  Head of bed elevated 30 degrees. Plateau pressures less than 30 cm H20.    SAT/SBT as tolerated, mentation precludes extubation  Ensure adequate pulmonary hygiene  Follow cultures  VAP bundle in place  PAD protocol    Suspected postobstructive pneumonia with right lung collapse  Plan:  Cultures negative, can't use wbc/fever curve reliably  on dex/ Will d/c zosyn 2/22 as completing 10 days rx then check pct if clinically looks infected >>> rx RT     Anemia   Lab Results  Component Value Date   HGB  10.6 (L) 02/10/2020   HGB 9.7 (L) 02/09/2020   HGB 9.9 (L) 02/08/2020      Plan Trend CBC Monitor for bleeding  Transfuse for HGB < 7 D/c'd lovenox 2/22 due to risk of recurrent bleeding    COPD Plan: Changed brovana to xopenex qid 2/22 due to RAF Add back pulmicort since off dex as of 2/20       Circulatory shock/ neo dep am 2/22  Intake/Output Summary (Last 24 hours) at 02/10/2020 1108 Last data filed at 02/10/2020 0900 Gross per 24 hour  Intake 2859.76 ml  Output 1525 ml  Net 1334.76 ml    cvp pending  >>>  May need intravasc vol expansion to control afib rate and get off neo >>> added stress steroids 2/22 as still on pressors and recent dex dc   New onset Atrial Fibrillation  -Developed during bronch  P: Continue Cardizem plus  Lopressor and check cvp for vol status   Acute metabolics  Encephalopathy   -Suspect largely due to his left frontal lobe lesion.  Consider also contribution of metabolic status, his renal insufficiency.  EEG 2/13 reassuring.    Plan: Neurology following  Seizure precautions  Continue AED, Keppra Decadron stopped 2/20 so added [facility] 2/22   Goals of care Plan: Ongoing discussion with family.   Currently DNR Palliative care following   Nutrition Plan: Continue tube feeds.  Best practice:  Diet: N.p.o.,  tube feed Pain/Anxiety/Delirium protocol (if indicated): Propofol, fentanyl VAP protocol (if indicated): Ordered DVT prophylaxis: SCDs only for now given bleeding risk brain and R MSB  GI prophylaxis: Protonix Glucose control:Monitor Mobility: Bed Code Status: Full Family Communication:  Daughter and wife updated 02/10/2020  Disposition: ICU       LABS  Glucose Recent Labs  Lab 02/09/20 1532 02/09/20 1933 02/09/20 2315 02/10/20 0300 02/10/20 0301 02/10/20 0849  GLUCAP 110* 108* 102* 44* 106* 98    BMET Recent Labs  Lab 02/08/20 0415 02/09/20 0551 02/10/20 0528  NA 142 146* 146*  K 5.1 4.9 4.4  CL 110 116* 116*  CO2 24 27 24   BUN 38* 56* 59*  CREATININE 1.32* 1.16 1.04  GLUCOSE 121* 118* 112*    Liver Enzymes Recent Labs  Lab 02/08/20 0415  AST 11*  ALT 13  ALKPHOS 65  BILITOT 0.6  ALBUMIN 2.2*    Electrolytes Recent Labs  Lab 02/08/20 0415 02/08/20 1458 02/09/20 0551 02/09/20 1700 02/10/20 0528  CALCIUM 9.2  --  8.7*  --  8.8*  MG 2.4   < > 2.8* 2.6* 2.4  PHOS  --    < > 3.5 3.9 2.7   < > = values in this interval not displayed.    CBC Recent Labs  Lab 02/08/20 0415 02/09/20 0551 02/10/20 0528  WBC 16.7* 13.7* 18.0*  HGB 9.9* 9.7* 10.6*  HCT 35.2* 35.0* 36.4*  PLT 207 184 PLATELET CLUMPS NOTED ON SMEAR, COUNT APPEARS ADEQUATE    ABG No results for input(s): PHART, PCO2ART, PO2ART in the last 168 hours.  Coag's No results for input(s): APTT, INR in the last 168 hours.  Sepsis  Markers No results for input(s): LATICACIDVEN, PROCALCITON, O2SATVEN in the last 168 hours.  Cardiac Enzymes No results for input(s): TROPONINI, PROBNP in the last 168 hours.    The patient is critically ill with multiple organ systems failure and requires high complexity decision making for assessment and support, frequent evaluation and titration of therapies, application of advanced monitoring technologies and extensive  interpretation of multiple databases. Critical Care Time devoted to patient care services described in this note is 45 minutes.    Christinia Gully, MD Pulmonary and [place] [phone] After 5:30 PM or weekends, use Beeper [phone]

## 2020-02-10 NOTE — Progress Notes (Signed)
Nutrition Follow-up  DOCUMENTATION CODES:   Morbid obesity  INTERVENTION:  Pivot 1.5 @ 40 ml/hr with 60 ml prostat QID. - this regimen will provide 2240 kcal, (2458 kcal with propofol at current rate) 210 grams protein, and 729 ml free water.   NUTRITION DIAGNOSIS:   Inadequate oral intake related to inability to eat as evidenced by NPO status.  Addressing via tube feedings  GOAL:   Provide needs based on ASPEN/SCCM guidelines  Progressing  MONITOR:   Vent status, Labs, Weight trends  REASON FOR ASSESSMENT:   Consult Enteral/tube feeding initiation and management  ASSESSMENT:   69 y.o. male with medical history of A. fib, R lung mass, and smoker. He presented to [facility] due to insomnia x4 days, chronic cough. His family reported that he was diagnosed with lung mass that he was to have biopsied but he went into cardiac arrest during initial attempt. Biopsy was re-scheduled but patient missed the appointment. His wife reported that he has been acting bizarre and talking nonsense.  Per notes: - lung mass with brain mets; subcarinal/R hilar mass--thought to be stage 4 lung cancer  - XRT simulation and first treatment on 2/16 - R lung collapse due to endobronchial obstruction - acute metabolic encephalopathy - plans for radiation therapy today  Patient is currently intubated on ventilator support MV: 6.8 L/min Temp (24hrs), Avg:98.3 F (36.8 C), Min:97.8 F (36.6 C), Max:98.7 F (37.1 C)  Propofol: 8.26  Ml/hr provides 218 kcal daily  Medications reviewed and include: Folic acid, Lovenox, T05 Drips: Cardizem Fentanyl Keppra Neo 70 mcg Zosyn  Labs: CBGs 98,106,44,102,108 x 24 hrs, Na 146 (H), BUN 59 (H), WBC 18 (H)  Diet Order:   Diet Order            Diet NPO time specified  Diet effective midnight              EDUCATION NEEDS:   No education needs have been identified at this time  Skin:  Skin Assessment: Reviewed RN Assessment  Last  BM:  2/16  Height:   Ht Readings from Last 1 Encounters:  02/07/2020 6' (1.829 m)    Weight:   Wt Readings from Last 1 Encounters:  02/10/20 (!) 144.8 kg    Ideal Body Weight:  80.9 kg  BMI:  Body mass index is 43.29 kg/m.  Estimated Nutritional Needs:   Kcal:  1780-2022 kcal  Protein:  >/= 202 grams  Fluid:  >/= 2 L/day    Lajuan Lines, RD, LDN Clinical Nutrition After Hours/Weekend Pager # in [place]

## 2020-02-11 ENCOUNTER — Ambulatory Visit
Admit: 2020-02-11 | Discharge: 2020-02-11 | Disposition: A | Discharge status: Home / Self Care | Payer: Medicare HMO | Attending: Radiation Oncology | Admitting: Radiation Oncology

## 2020-02-11 LAB — GLUCOSE, CAPILLARY
Glucose-Capillary: 44 mg/dL — CL (ref 70–99)
Glucose-Capillary: 151 mg/dL — ABNORMAL HIGH (ref 70–99)
Glucose-Capillary: 164 mg/dL — ABNORMAL HIGH (ref 70–99)
Glucose-Capillary: 154 mg/dL — ABNORMAL HIGH (ref 70–99)
Glucose-Capillary: 156 mg/dL — ABNORMAL HIGH (ref 70–99)
Glucose-Capillary: 140 mg/dL — ABNORMAL HIGH (ref 70–99)
Glucose-Capillary: 146 mg/dL — ABNORMAL HIGH (ref 70–99)

## 2020-02-11 LAB — TRIGLYCERIDES: Triglycerides: 102 mg/dL (ref <150)

## 2020-02-11 LAB — HEMOGLOBIN A1C
Hgb A1c MFr Bld: 5.7 % — ABNORMAL HIGH (ref 4.8–5.6)
Mean Plasma Glucose: 116.89 mg/dL

## 2020-02-11 MED ORDER — DIGOXIN 0.25 MG/ML IJ SOLN
0.2500 mg | Freq: Three times a day (TID) | INTRAMUSCULAR | Status: AC
  Administered 2020-02-11 (×3): 0.25 mg via INTRAVENOUS
  Filled 2020-02-11 (×3): qty 2

## 2020-02-11 MED ORDER — SENNOSIDES 8.8 MG/5ML PO SYRP
5.0000 mL | ORAL_SOLUTION | Freq: Every day | ORAL | Status: DC
  Administered 2020-02-11 – 2020-02-16 (×6): 5 mL
  Filled 2020-02-11 (×6): qty 5

## 2020-02-11 MED ORDER — DOCUSATE SODIUM 50 MG/5ML PO LIQD
50.0000 mg | Freq: Every day | ORAL | Status: DC
  Administered 2020-02-11 – 2020-02-16 (×6): 50 mg
  Filled 2020-02-11 (×5): qty 10

## 2020-02-11 MED ORDER — METOPROLOL TARTRATE 25 MG/10 ML ORAL SUSPENSION
25.0000 mg | Freq: Two times a day (BID) | ORAL | Status: DC
  Administered 2020-02-11 – 2020-02-13 (×6): 25 mg via ORAL
  Filled 2020-02-11 (×7): qty 10

## 2020-02-11 MED ORDER — INSULIN ASPART 100 UNIT/ML ~~LOC~~ SOLN
0.0000 [IU] | SUBCUTANEOUS | Status: DC
  Administered 2020-02-11 (×2): 1 [IU] via SUBCUTANEOUS
  Administered 2020-02-11 (×3): 2 [IU] via SUBCUTANEOUS
  Administered 2020-02-11: 17:00:00 1 [IU] via SUBCUTANEOUS
  Administered 2020-02-11 – 2020-02-12 (×2): 2 [IU] via SUBCUTANEOUS
  Administered 2020-02-12 (×3): 1 [IU] via SUBCUTANEOUS
  Administered 2020-02-12: 2 [IU] via SUBCUTANEOUS
  Administered 2020-02-12: 1 [IU] via SUBCUTANEOUS
  Administered 2020-02-13 (×2): 2 [IU] via SUBCUTANEOUS
  Administered 2020-02-13 (×4): 1 [IU] via SUBCUTANEOUS
  Administered 2020-02-14 (×5): 2 [IU] via SUBCUTANEOUS
  Administered 2020-02-15 (×5): 1 [IU] via SUBCUTANEOUS
  Administered 2020-02-16 (×3): 2 [IU] via SUBCUTANEOUS

## 2020-02-11 MED ORDER — SODIUM CHLORIDE 0.9 % IV SOLN
INTRAVENOUS | Status: DC | PRN
  Administered 2020-02-11: 250 mL via INTRAVENOUS

## 2020-02-11 MED ORDER — PANTOPRAZOLE SODIUM 40 MG PO PACK
40.0000 mg | PACK | Freq: Every day | ORAL | Status: DC
  Administered 2020-02-11 – 2020-02-16 (×6): 40 mg
  Filled 2020-02-11 (×5): qty 20

## 2020-02-11 NOTE — Progress Notes (Signed)
In accordance with spouse's wishes, chaplain rounded on Shane Avila and shared prayers at bedside.

## 2020-02-11 NOTE — Progress Notes (Signed)
NAME:  Shane Avila, MRN:  [id number], DOB:  1951/11/25, LOS: 53 ADMISSION DATE:  02/15/2020, CONSULTATION DATE: 02/01/2020 REFERRING MD: Dr. Tyrell Antonio, CHIEF COMPLAINT: Subcarinal mass  Brief History   69 year old former smoker (110 pack years) with a history of COPD and chronic hypoxemic respiratory failure on chronic O2 at 2 L/min.  Also with hypertension, hyperlipidemia, atrial fibrillation and depression.  He  wasundergoing work-up for a subcarinal, right hilar mass that was first discovered on CT chest 11/13/2019 at [place].   He was evaluated in the ED at [facility] 2/11 for confusion and "bizarre behavior".  CT head revealed a left frontal small hyperdense area, question SAH versus mass.  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  His other work-up revealed acute renal failure (improving).   Underwent Bronchoscopy 2/16 for obstructed right mainstem with Dr. Valeta Harms, with evidence of fully obstructed right lung from tumor originating from the medial portion of the main carina. During procedure patient developed Rapid A-fib with RVR.   If the patient was to have significant amount of bleeding from the endotracheal tube the endotracheal tube should be advanced into the left mainstem for unilateral left-sided lung ventilation.  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, tx to [facility] for XRT 2/19 ETT tube exchange due to blown cuff 2/20 Dex d/cd  2/22 started on stress [facility] for low bp   Consults:   Neurology PCCM Radiation oncology  Procedures:  Bronchoscopy 2/16, 2/19 ETT 2/16 >> Lt IJ CVL 2/16 >> Lt radial A aline 2/16 > 2/19 RT to Lung 2/17 and 18th held on 19th due to unstable rhythm  RT resumed 2/22   Significant Diagnostic Tests:  CT chest Oval Linsey) 01/30/2020 >> 5.1 x 4.8 cm subcarinal mass that impacts the main carina and extrinsically compresses the right mainstem bronchus, right upper lobe airway and bronchus intermedius.  Unclear whether there is an endobronchial lesion.  Significant right lower lobe volume loss/atelectasis/collapse vs post-obstructive PNA.  Small right effusion MRI Brain w/o 2/12 >> approximate 52m cortically based hemorrhagic mass inovlving the anterior left frontal lobe, mild vasogenic edema w/o significant regional mass effect MRI Brain w/o 2/13 >> motion degraded exam, unchanged 14 mm cortically based hemorrhagic mass  EEG 2/13  wnl  Micro Data:  SARSCov2 2/12 >> negative Blood 2/12 >> negative  Respiratory 2/12 >> never sent  MRSA PCR 2/17 >>> neg  Antimicrobials:  Ceftriaxone 2/12 x1 Azithromycin 2/12 > 2/20  Zosyn 2/13 >  2/22   Scheduled Meds: . atorvastatin  40 mg Per Tube QHS  . budesonide (PULMICORT) nebulizer solution  0.5 mg Nebulization BID  . chlorhexidine gluconate (MEDLINE KIT)  15 mL Mouth Rinse BID  . Chlorhexidine Gluconate Cloth  6 each Topical Daily  . diltiazem  60 mg Per Tube Q6H  . feeding supplement (PRO-STAT SUGAR FREE 64)  60 mL Per Tube QID  . folic acid  1 mg Per Tube Daily  . hydrocortisone sodium succinate  100 mg Intravenous Q6H  . insulin aspart  0-9 Units Subcutaneous Q4H  . levalbuterol  0.63 mg  Nebulization Q6H  . mouth rinse  15 mL Mouth Rinse 10 times per day  . metoprolol tartrate  5 mg Intravenous Q6H  . mupirocin cream   Topical Daily  . sodium chloride flush  10-40 mL Intracatheter Q12H  . vitamin B-12  1,000 mcg Per Tube Daily   Continuous Infusions: . sodium chloride 10 mL/hr at 02/11/20  0600  . diltiazem (CARDIZEM) infusion 15 mg/hr (02/11/20 7544)  . feeding supplement (PIVOT 1.5 CAL) 1,000 mL (02/10/20 1330)  . fentaNYL infusion INTRAVENOUS 100 mcg/hr (02/11/20 0600)  . phenylephrine 10 mcg/min (02/11/20 9201)  . propofol (DIPRIVAN) infusion 15 mcg/kg/min (02/11/20 0600)   PRN Meds:.sodium chloride, acetaminophen **OR** acetaminophen, docusate, fentaNYL, metoprolol tartrate, ondansetron **OR** ondansetron (ZOFRAN) IV, sodium chloride flush   Interim history/subjective:  Sedated on vent/ off neo this am   Objective   Blood pressure 135/68, pulse 68, temperature 97.8 F (36.6 C), temperature source Axillary, resp. rate 12, height 6' (1.829 m), weight 130.5 kg, SpO2 97 %. CVP:  [9 mmHg-14 mmHg] 9 mmHg  Vent Mode: PRVC FiO2 (%):  [40 %-50 %] 40 % Set Rate:  [12 bmp] 12 bmp Vt Set:  [620 mL] 620 mL PEEP:  [5 cmH20-40 cmH20] 5 cmH20 Plateau Pressure:  [20 cmH20-28 cmH20] 26 cmH20   Intake/Output Summary (Last 24 hours) at 02/11/2020 1004 Last data filed at 02/11/2020 0071 Gross per 24 hour  Intake 3601.63 ml  Output 2175 ml  Net 1426.63 ml   Filed Weights   02/09/20 0500 02/10/20 0500 02/11/20 0520  Weight: (!) 144.3 kg (!) 144.8 kg 130.5 kg      Examination: Obese wm sedated on vent No jvd Oropharynx et Neck supple Lungs with  exp > insp rhonchi bilaterally R > L  RRR no s3 or or sign murmur Abd obese with limied excursion  Extr warm with no edema or clubbing noted/ pos PAS        Resolved Hospital Problem list   Acute renal failure   Assessment & Plan:   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 -case reviewed with Neurosurgery, given no contrasted images and inability to determine actual size of mass vs contribution of hemorrhage. Dr. Nickolas Madrid ([place]) made aware of case as well to follow peripherally / for Tumor Board review, appreciate assistance. -underwent video bronch 2/16  -Remained  intubated post bronchoscopy due to significant tumor presence  Plan: Minimal  endotracheal suctioning - ok to suction the et to just beyond the level of the end (reviewed with RT) If bleeding occurs advance ETT into left mainstem NOT RIGHT RT planned pm 2/23  Acute respiratory failure with hypoxemia   Wean PEEP and FiO2 for sats greater than 90%.per ards protocol  Head of bed elevated 30 degrees. Plateau pressures less than 30 cm H20 > achieved  SAT/SBT as tolerated, mentation precludes extubation  Ensure adequate pulmonary hygiene  VAP bundle in place  PAD protocol    Suspected postobstructive pneumonia with right lung collapse  Plan:  d/c'd  zosyn 2/22 p completing 10 days rx >>  check pct if clinically looks infected >>> rx RT continue, no evidence of improved aeration of R lung but explained to fm 2/22 way to soon to "see if we'll win this battle... winning the war is another issue and much tougher fight" ie the big picture is not looking good but short term goal may still be achievable > get him off vent  Anemia   Lab Results  Component Value Date   HGB 10.6 (L) 02/10/2020   HGB 9.7 (L) 02/09/2020   HGB 9.9 (L) 02/08/2020      Plan Trend CBC Monitor for bleeding  Transfuse for HGB < 7 D/c'd lovenox 2/22 due to risk of recurrent bleeding airway/ brain met   COPD Plan: Changed brovana to xopenex qid 2/22 due to RAF Added back pulmicort 2/22 since off dex as of 2/20       Circulatory shock   Intake/Output Summary (Last 24 hours) at 02/11/2020 1004 Last data filed at 02/11/2020 1751 Gross per 24 hour  Intake 3601.63 ml  Output 2175 ml  Net 1426.63 ml   for now remains off neo   New onset Atrial Fibrillation  -Developed during bronch  P: Continue Cardizem plus  Lopressor Keep intravasc volume and  Add digoxin 2/23 and if not better 2/24 consider amio    Acute metabolics Encephalopathy   -Suspect largely due to his left frontal lobe lesion.   Plan: Neurology following  Seizure precautions  Continue AED, Keppra Decadron stopped 2/20 so added [facility] 2/22    Goals of care Plan: Ongoing discussion with family.   Currently DNR Palliative care following   Nutrition Plan: Continue tube feeds.  Best practice:  Diet: N.p.o.,  tube feed Pain/Anxiety/Delirium protocol (if indicated): Propofol, fentanyl VAP protocol (if indicated): Ordered DVT prophylaxis: SCDs only for now given bleeding risk brain and R MSB  GI prophylaxis: Protonix Glucose control:Monitor Mobility: Bed Code Status: Full Family Communication:  Daughter and wife updated 2/22  Disposition: ICU       LABS  Glucose Recent Labs  Lab 02/10/20 1659 02/10/20 1946 02/10/20 2339 02/11/20 0104 02/11/20 0420 02/11/20 0805  GLUCAP 134* 125* 141* 151* 164* 154*    BMET Recent Labs  Lab 02/08/20 0415 02/09/20 0551 02/10/20 0528  NA 142 146* 146*  K 5.1 4.9 4.4  CL 110 116* 116*  CO2 24 27 24   BUN 38* 56* 59*  CREATININE 1.32* 1.16 1.04  GLUCOSE 121* 118* 112*    Liver Enzymes Recent Labs  Lab 02/08/20 0415  AST 11*  ALT 13  ALKPHOS 65  BILITOT 0.6  ALBUMIN 2.2*    Electrolytes Recent Labs  Lab 02/08/20 0415 02/08/20 1458 02/09/20 0551 02/09/20 1700 02/10/20 0528  CALCIUM 9.2  --  8.7*  --  8.8*  MG 2.4   < > 2.8* 2.6* 2.4  PHOS  --    < > 3.5 3.9 2.7   < > = values in this interval not displayed.    CBC Recent Labs  Lab 02/08/20 0415 02/09/20 0551 02/10/20 0528  WBC 16.7* 13.7* 18.0*  HGB 9.9* 9.7* 10.6*  HCT 35.2* 35.0* 36.4*  PLT 207 184 PLATELET CLUMPS NOTED ON SMEAR, COUNT APPEARS ADEQUATE    ABG No results for input(s): PHART, PCO2ART, PO2ART in the last 168 hours.  Coag's No results for input(s): APTT, INR in the last 168 hours.  Sepsis Markers No results for input(s): LATICACIDVEN, PROCALCITON, O2SATVEN in the last 168 hours.  Cardiac Enzymes No results for input(s): TROPONINI, PROBNP in the last 168  hours.     The patient is critically ill with multiple organ systems failure and requires high complexity decision making for assessment and support, frequent evaluation and titration of therapies, application of advanced monitoring technologies and extensive interpretation of multiple databases. Critical Care Time devoted to patient care services described in this note is 35 minutes.  Christinia Gully, MD Pulmonary and [place] [phone] After 5:30 PM or weekends, use Beeper [phone]

## 2020-02-11 NOTE — Progress Notes (Signed)
eLink Physician-Brief Progress Note Patient Name: Shane Avila DOB: [birth year] MRN: [id number]   Date of Service  02/11/2020  HPI/Events of Note  Hyperglycemia - Blood glucose = 141.  eICU Interventions  Will order: 1. Q 4 hour sensitive Novolog SSI.     Intervention Category Major Interventions: Hyperglycemia - active titration of insulin therapy  Lysle Dingwall 02/11/2020, 12:41 AM

## 2020-02-12 ENCOUNTER — Inpatient Hospital Stay (HOSPITAL_COMMUNITY): Payer: Medicare HMO

## 2020-02-12 ENCOUNTER — Ambulatory Visit
Admit: 2020-02-12 | Discharge: 2020-02-12 | Disposition: A | Discharge status: Home / Self Care | Payer: Medicare HMO | Attending: Radiation Oncology | Admitting: Radiation Oncology

## 2020-02-12 DIAGNOSIS — I4891 Unspecified atrial fibrillation: Secondary | ICD-10-CM | POA: Diagnosis present

## 2020-02-12 DIAGNOSIS — L8990 Pressure ulcer of unspecified site, unspecified stage: Secondary | ICD-10-CM | POA: Insufficient documentation

## 2020-02-12 LAB — BASIC METABOLIC PANEL
Anion gap: 5 (ref 5–15)
BUN: 50 mg/dL — ABNORMAL HIGH (ref 8–23)
CO2: 26 mmol/L (ref 22–32)
Calcium: 8.4 mg/dL — ABNORMAL LOW (ref 8.9–10.3)
Chloride: 118 mmol/L — ABNORMAL HIGH (ref 98–111)
Creatinine, Ser: 0.71 mg/dL (ref 0.61–1.24)
GFR calc Af Amer: >60 mL/min (ref >60)
GFR calc non Af Amer: >60 mL/min (ref >60)
Glucose, Bld: 136 mg/dL — ABNORMAL HIGH (ref 70–99)
Potassium: 4.6 mmol/L (ref 3.5–5.1)
Sodium: 149 mmol/L — ABNORMAL HIGH (ref 135–145)

## 2020-02-12 LAB — GLUCOSE, CAPILLARY
Glucose-Capillary: 128 mg/dL — ABNORMAL HIGH (ref 70–99)
Glucose-Capillary: 146 mg/dL — ABNORMAL HIGH (ref 70–99)
Glucose-Capillary: 124 mg/dL — ABNORMAL HIGH (ref 70–99)
Glucose-Capillary: 151 mg/dL — ABNORMAL HIGH (ref 70–99)
Glucose-Capillary: 132 mg/dL — ABNORMAL HIGH (ref 70–99)
Glucose-Capillary: 134 mg/dL — ABNORMAL HIGH (ref 70–99)

## 2020-02-12 LAB — CBC
HCT: 33.9 % — ABNORMAL LOW (ref 39.0–52.0)
Hemoglobin: 9.7 g/dL — ABNORMAL LOW (ref 13.0–17.0)
MCH: 26.2 pg (ref 26.0–34.0)
MCHC: 28.6 g/dL — ABNORMAL LOW (ref 30.0–36.0)
MCV: 91.6 fL (ref 80.0–100.0)
Platelets: 160 10*3/uL (ref 150–400)
RBC: 3.7 MIL/uL — ABNORMAL LOW (ref 4.22–5.81)
RDW: 17 % — ABNORMAL HIGH (ref 11.5–15.5)
WBC: 11.9 10*3/uL — ABNORMAL HIGH (ref 4.0–10.5)
nRBC: 0 % (ref 0.0–0.2)

## 2020-02-12 LAB — TRIGLYCERIDES: Triglycerides: 289 mg/dL — ABNORMAL HIGH (ref <150)

## 2020-02-12 IMAGING — DX DG CHEST 1V PORT
1 series · 1 of 1 positions shown · non-contrast
Comparison: Radiograph [DATE]

CLINICAL DATA: Respiratory failure

EXAM:
PORTABLE CHEST 1 VIEW

[chest ap]
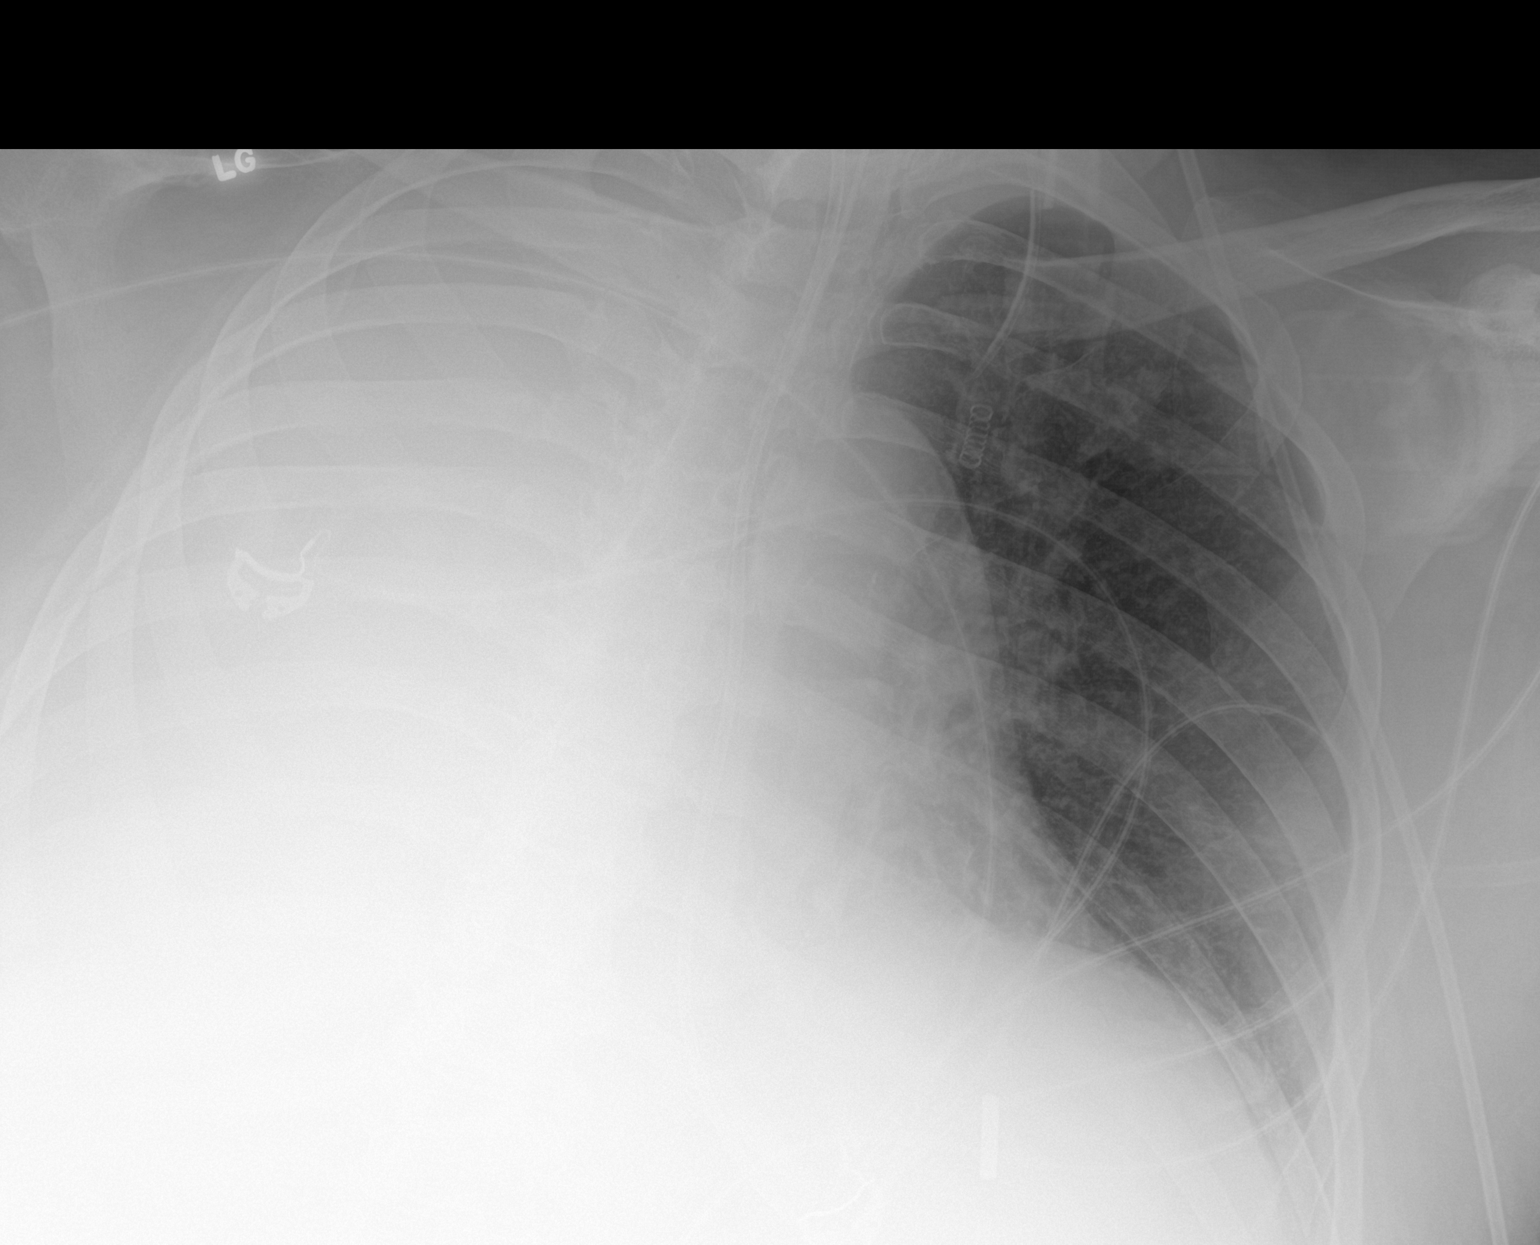

[1 of 1 positions shown; findings below may reference images not displayed]

FINDINGS: Endotracheal tube terminates in the mid trachea, 3.6 cm from the
carina. Transesophageal feeding tube tip terminates beyond the GE
junction, below the level of imaging. Left IJ approach central
venous catheter terminates along the left upper mediastinum, stable
position from prior. Telemetry leads overlie the chest.

Persistent opacification of the right hemithorax. Left basilar
atelectatic changes are present with some increasing hazy opacities
which could reflect further atelectatic change, early infection or
edema. Visible cardiomediastinal contours are unchanged from prior.
No acute osseous or soft tissue abnormality.
IMPRESSION: Some increasing hazy opacity in the aerated portions of the left
lung may reflect could reflect atelectasis, edema or early
infection.

Persistent opacification of the right hemithorax.

Stable positioning of lines and tubes.

## 2020-02-12 IMAGING — DX DG ABD PORTABLE 1V
2 series · 2 of 2 positions shown · non-contrast
Comparison: One view abdomen [DATE].

CLINICAL DATA: Nasogastric tube placement.

EXAM:
PORTABLE ABDOMEN - 1 VIEW

[abdomen kub (1 of 2)]
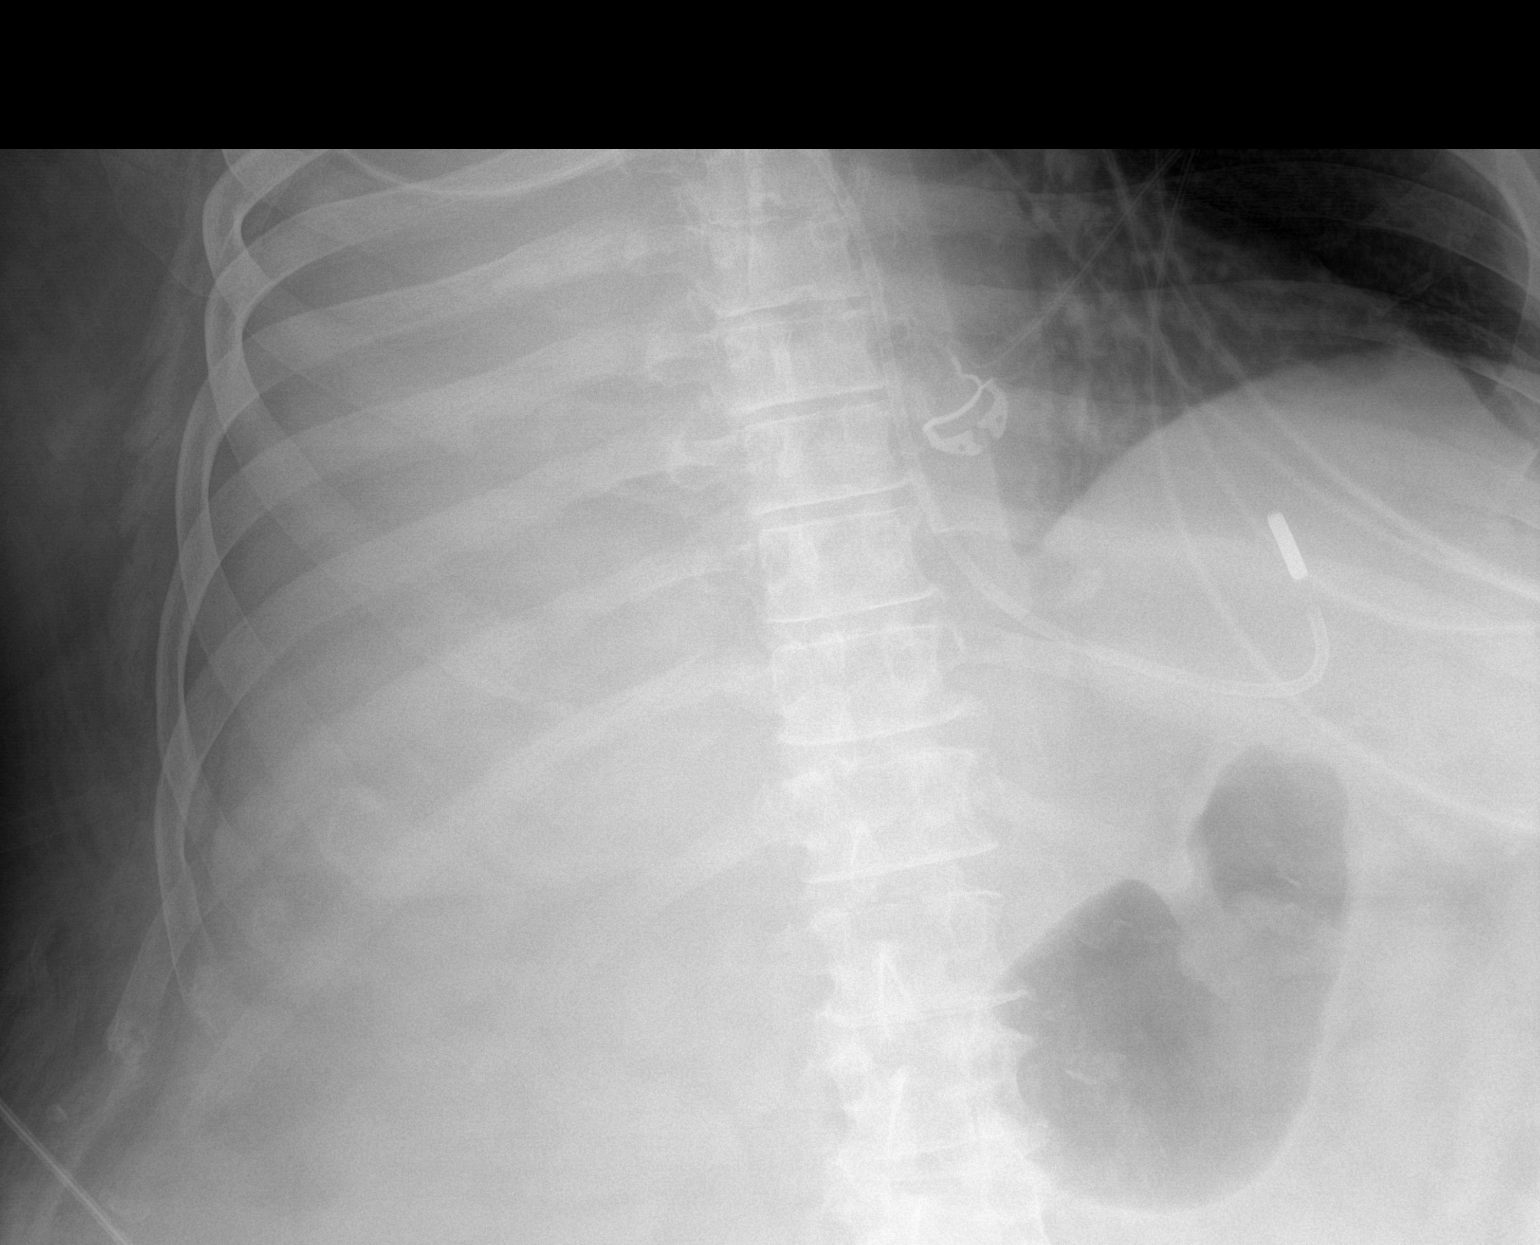

[abdomen kub (2 of 2)]
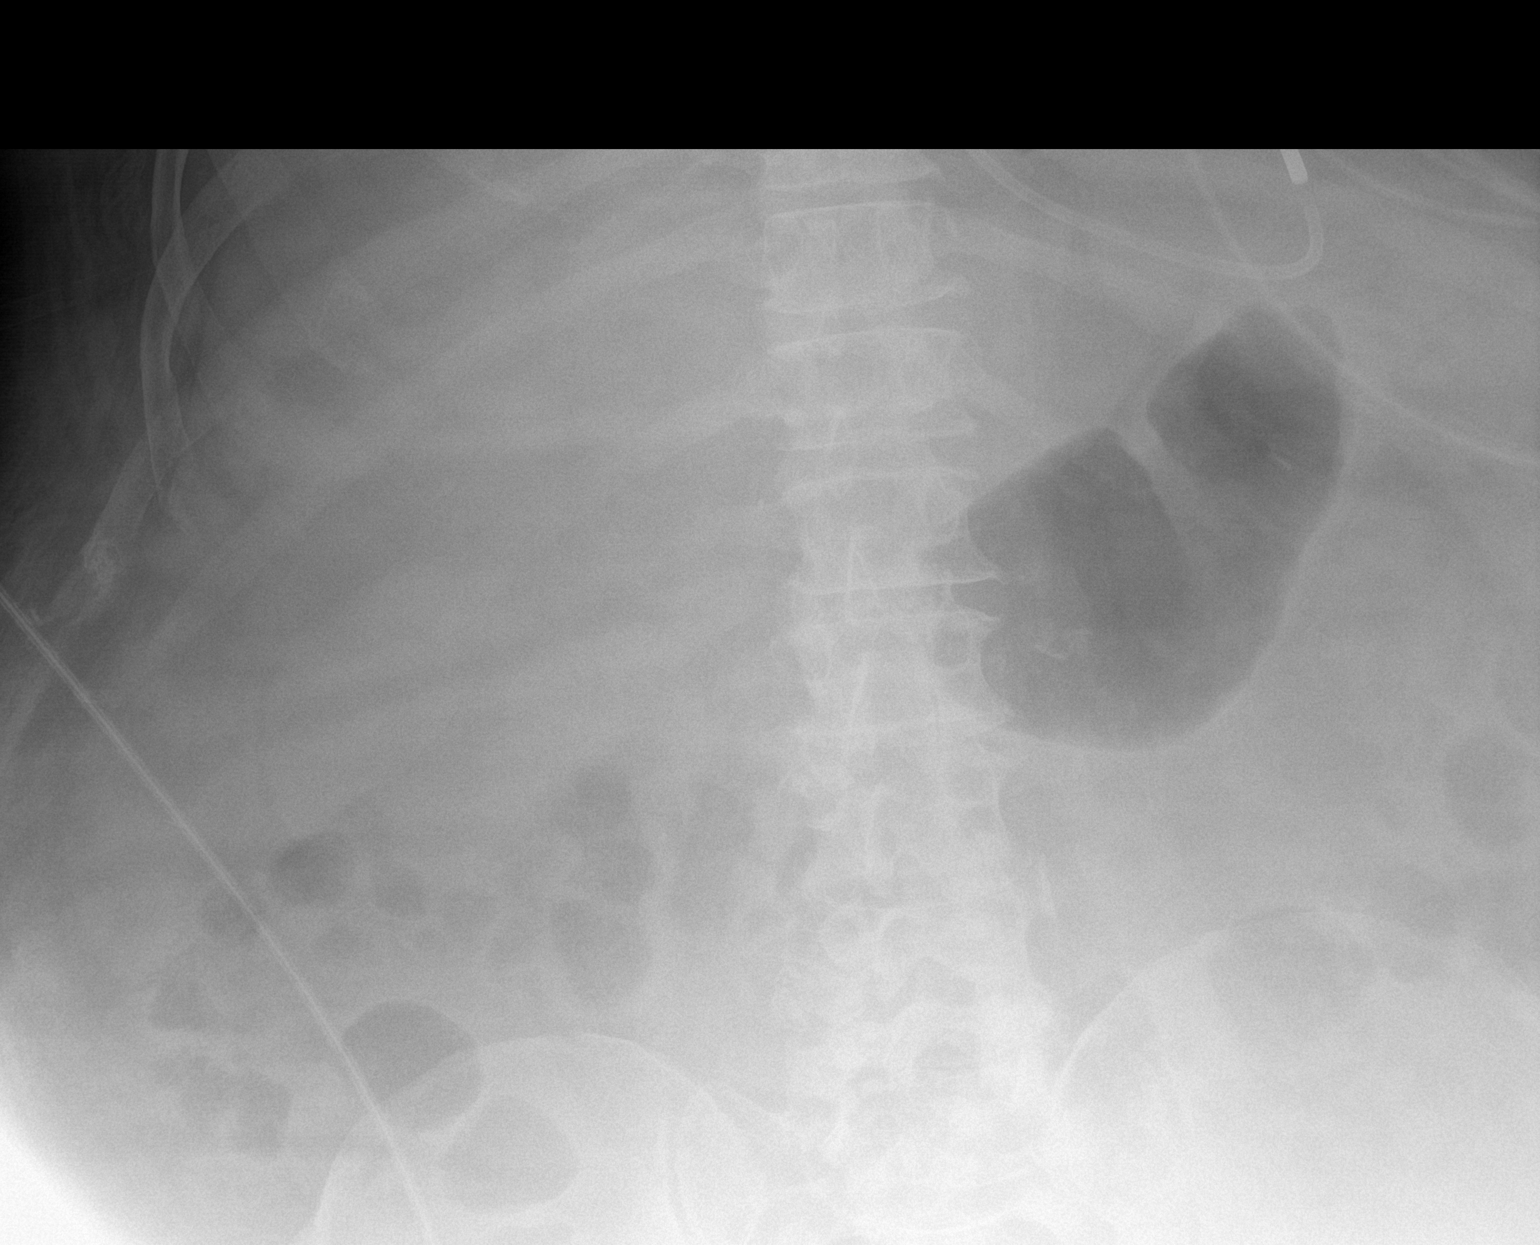

[2 of 2 positions shown; findings below may reference images not displayed]

FINDINGS: [QE] hours. Feeding tube projects over the left upper quadrant of
the abdomen consistent with location in the gastric fundus. The
visualized bowel gas pattern is nonobstructive. Persistent complete
opacification of the visualized right hemithorax as seen on recent
chest radiographs. No acute osseous findings.
IMPRESSION: Feeding tube tip projects over the gastric fundus.

## 2020-02-12 MED ORDER — DIGOXIN 125 MCG PO TABS
0.2500 mg | ORAL_TABLET | Freq: Every day | ORAL | Status: DC
  Administered 2020-02-12 – 2020-02-16 (×5): 0.25 mg
  Filled 2020-02-12 (×5): qty 2

## 2020-02-12 MED ORDER — FREE WATER
200.0000 mL | Freq: Four times a day (QID) | Status: DC
  Administered 2020-02-12 – 2020-02-13 (×4): 200 mL

## 2020-02-12 MED ORDER — DEXAMETHASONE SODIUM PHOSPHATE 4 MG/ML IJ SOLN
4.0000 mg | Freq: Four times a day (QID) | INTRAMUSCULAR | Status: DC
  Administered 2020-02-12 – 2020-02-16 (×17): 4 mg via INTRAVENOUS
  Filled 2020-02-12 (×17): qty 1

## 2020-02-12 NOTE — Progress Notes (Addendum)
NAME:  Shane Avila, MRN:  [id number], DOB:  12-23-1950, LOS: 12 ADMISSION DATE:  01/27/2020, CONSULTATION DATE: 02/01/2020 REFERRING MD: Dr. Tyrell Antonio, CHIEF COMPLAINT: Subcarinal mass  Brief History   69 year old former smoker (110 pack years) with a history of COPD and chronic hypoxemic respiratory failure on chronic O2 at 2 L/min.  Also with hypertension, hyperlipidemia, atrial fibrillation and depression.  He  wasundergoing work-up for a subcarinal, right hilar mass that was first discovered on CT chest 11/13/2019 at [facility].   He was evaluated in the ED at [facility] 2/11 for confusion and "bizarre behavior".  CT head revealed a left frontal small hyperdense area, question SAH versus mass.  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  His other work-up revealed acute renal failure (improving).   Underwent Bronchoscopy 2/16 for obstructed right mainstem with Dr. Valeta Harms, with evidence of fully obstructed right lung from tumor originating from the medial portion of the main carina. During procedure patient developed Rapid A-fib with RVR.   If the patient was to have significant amount of bleeding from the endotracheal tube the endotracheal tube should be advanced into the left mainstem for unilateral left-sided lung ventilation.  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, tx to [facility] for XRT 2/19 ETT tube exchange due to blown cuff 2/20 Dex d/cd  2/22 started on stress HC for low bp> changed back to dex per RT Rec 2/24     Consults:  Neurology PCCM Radiation oncology Tammi Klippel)   Procedures:  Bronchoscopy 2/16, 2/19 ETT 2/16 >> Lt IJ CVL 2/16 >> Lt radial A aline 2/16 > 2/19 RT to Lung 2/17 and 18th held on 19th due to unstable rhythm  RT resumed 2/22   Significant Diagnostic Tests:  CT chest Oval Linsey) 01/30/2020 >> 5.1 x 4.8 cm subcarinal mass that impacts the main carina and extrinsically compresses the right mainstem bronchus, right upper lobe airway and bronchus intermedius.  Unclear whether there is an endobronchial lesion.  Significant right lower lobe volume loss/atelectasis/collapse vs post-obstructive PNA.  Small right effusion MRI Brain w/o 2/12 >> approximate 94m cortically based hemorrhagic mass inovlving the anterior left frontal lobe, mild vasogenic edema w/o significant regional mass effect MRI Brain w/o 2/13 >> motion degraded exam, unchanged 14 mm cortically based hemorrhagic mass  EEG 2/13  wnl  Micro Data:  SARSCov2 2/12 >> negative Blood 2/12 >> negative  Respiratory 2/12 >> never sent  MRSA PCR 2/17 >>> neg  Antimicrobials:  Ceftriaxone 2/12 x1 Azithromycin 2/12 > 2/20  Zosyn 2/13 >  2/22   Scheduled Meds: . atorvastatin  40 mg Per Tube QHS  . budesonide (PULMICORT) nebulizer solution  0.5 mg Nebulization BID  . chlorhexidine gluconate (MEDLINE KIT)  15 mL Mouth Rinse BID  . Chlorhexidine Gluconate Cloth  6 each Topical Daily  . diltiazem  60 mg Per Tube Q6H  . docusate  50 mg Per Tube Daily  . feeding supplement (PRO-STAT SUGAR FREE 64)  60 mL Per Tube QID  . folic acid  1 mg Per Tube Daily  . hydrocortisone sodium succinate  100 mg Intravenous Q6H  . insulin aspart  0-9 Units Subcutaneous Q4H  . levalbuterol  0.63 mg Nebulization Q6H  . mouth rinse  15 mL Mouth Rinse 10 times per day  . metoprolol tartrate  25 mg Oral BID  . mupirocin cream   Topical Daily  . pantoprazole sodium  40 mg Per Tube Daily  . sennosides  5 mL Per Tube Daily  . sodium chloride flush   10-40 mL Intracatheter Q12H  . vitamin B-12  1,000 mcg Per Tube Daily   Continuous Infusions: . sodium chloride 10 mL/hr at 02/12/20 0915  . diltiazem (CARDIZEM) infusion 15 mg/hr (02/12/20 0915)  . feeding supplement (PIVOT 1.5 CAL) 1,000 mL (02/10/20 1330)  . fentaNYL infusion INTRAVENOUS 100 mcg/hr (02/12/20 0915)  . phenylephrine Stopped (02/11/20 1111)  . propofol (DIPRIVAN) infusion 14 mcg/kg/min (02/12/20 0915)   PRN Meds:.sodium chloride, acetaminophen **OR** acetaminophen, docusate, fentaNYL, metoprolol tartrate, ondansetron **OR** ondansetron (ZOFRAN) IV, sodium chloride flush   Interim history/subjective:  Sedated on vent/ maint off pressors  Still on cardizem drip but rate now controlled   Objective   Blood pressure 129/70, pulse 75, temperature 98.8 F (37.1 C), temperature source Axillary, resp. rate (!) 8, height 6' (1.829 m), weight 130.5 kg, SpO2 98 %. CVP:  [10 mmHg-11 mmHg] 11 mmHg  Vent Mode: PRVC FiO2 (%):  [40 %] 40 % Set Rate:  [12 bmp] 12 bmp Vt Set:  [509 mL] 620 mL PEEP:  [5 cmH20] 5 cmH20 Plateau Pressure:  [17 cmH20-21 cmH20] 21 cmH20   Intake/Output Summary (Last 24 hours) at 02/12/2020 1035 Last data filed at 02/12/2020 0915 Gross per 24 hour  Intake 4344.58 ml  Output 800 ml  Net 3544.58 ml   Filed Weights   02/10/20 0500 02/11/20 0520 02/12/20 0406  Weight: (!) 144.8 kg 130.5 kg 130.5 kg      Examination: Obese wm sedated on Vent No jvd Oropharynx et in place Neck supple Lungs with very poor air movent  Bilaterally with coarse rhonchi esp on R  RRR no s3 or or sign murmur Abd obese poor excursion  Extr warm with no edema or clubbing noted/ PAS hose  Neuro  Sedated with proprofol/ fent RASS -2    pCXR 2/24 reviewed:  No improvement aeration       Resolved Hospital Problem list   Acute renal failure   Assessment & Plan:   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 -case  reviewed with Neurosurgery, given no contrasted images and inability to determine actual size of mass vs contribution of hemorrhage. Dr. Nickolas Madrid ([facility]) made aware of case as well to follow peripherally / for Tumor Board review, appreciate assistance. -underwent video bronch 2/16  -Remained intubated post bronchoscopy due to significant tumor presence  Plan: Minimal  endotracheal suctioning - ok to suction the et to just beyond the level of the end (reviewed with RT) If bleeding occurs advance ETT into left mainstem NOT RIGHT RT restarted 2/22 pm and q weekday thereafter/ discussed logistics with Dr Tammi Klippel who feels the pt should be back on dex due to head met not planning on CNS Radiation for now    Acute respiratory failure with hypoxemia   Wean PEEP and FiO2 for sats greater than 90%.per ards protocol  Head of bed elevated 30 degrees. Plateau pressures less than 30 cm H20 > achieved  SAT/SBT as tolerated  Ensure adequate pulmonary hygiene  VAP bundle  in place  PAD protocol    Suspected postobstructive pneumonia with right lung collapse  Plan:  d/c'd  zosyn 2/22 p completing 10 days rx >>  rec check pct if clinically looks infected >>> rx RT continue, no evidence of improved aeration of R lung but explained to fm 2/22 way to soon to "see if we'll win this battle... winning the war is another issue and much tougher fight" ie the big picture is not looking good but short term goal may still be achievable > get him off vent     Anemia Lab Results  Component Value Date   HGB 9.7 (L) 02/12/2020   HGB 10.6 (L) 02/10/2020   HGB 9.7 (L) 02/09/2020    Plan Trend CBC Monitor for bleeding  Transfuse for HGB < 7 D/c'd lovenox 2/22 due to risk of recurrent bleeding airway/ brain met   COPD Plan: Changed brovana to xopenex qid 2/22 due to RAF Added back pulmicort 2/22       Circulatory shock  - CVP:  [10 mmHg-11 mmHg] 10 mmHg Remains off neo    New onset Atrial  Fibrillation  -Developed during bronch  P: Continue Cardizem IV drip plus  Lopressor per ft  Keep intravasc volume and  Added digoxin 2/23 > improved rate control, continue daily rx per pharmacy dosing   Acute metabolics Encephalopathy   -Suspect largely due to his left frontal lobe lesion.  Plan: Neurology following  Seizure precautions   Decadron stopped 2/20 so added [facility] 2/22 > changed to dex 2/24 at RT rec  Stop kepra 2/23 with  EEG nl    Goals of care Plan: Ongoing discussion with family > they are conflicted about how low to do RT before deciding about changing to comfort care vs trach and continue complete RT.   Currently DNR Palliative care following   Nutrition Plan: Continue tube feeds.  Hypernatremia acute new since admit - rx free water  Best practice:  Diet: N.p.o.,  tube feed Pain/Anxiety/Delirium protocol (if indicated): Propofol, fentanyl VAP protocol (if indicated): Ordered DVT prophylaxis: SCDs only for now given bleeding risk brain and R MSB  GI prophylaxis: Protonix Glucose control:Monitor Mobility: Bed Code Status: Full Family Communication:  Daughter and wife updated 2/22  Disposition: ICU       LABS  Glucose Recent Labs  Lab 02/11/20 0805 02/11/20 1139 02/11/20 1646 02/11/20 1948 02/11/20 2302 02/12/20 0401  GLUCAP 154* 156* 140* 146* 128* 146*    BMET Recent Labs  Lab 02/09/20 0551 02/10/20 0528 02/12/20 0300  NA 146* 146* 149*  K 4.9 4.4 4.6  CL 116* 116* 118*  CO2 27 24 26   BUN 56* 59* 50*  CREATININE 1.16 1.04 0.71  GLUCOSE 118* 112* 136*    Liver Enzymes Recent Labs  Lab 02/08/20 0415  AST 11*  ALT 13  ALKPHOS 65  BILITOT 0.6  ALBUMIN 2.2*    Electrolytes Recent Labs  Lab 02/09/20 0551 02/09/20 1700 02/10/20 0528 02/12/20 0300  CALCIUM 8.7*  --  8.8* 8.4*  MG 2.8* 2.6* 2.4  --   PHOS 3.5 3.9 2.7  --     CBC Recent Labs  Lab 02/09/20 0551 02/10/20 0528 02/12/20 0300  WBC 13.7* 18.0* 11.9*   HGB 9.7* 10.6* 9.7*  HCT 35.0* 36.4* 33.9*  PLT 184 PLATELET CLUMPS NOTED ON SMEAR, COUNT APPEARS ADEQUATE 160    ABG No results for input(s): PHART, PCO2ART, PO2ART in the last 168 hours.  Coag's No results  for input(s): APTT, INR in the last 168 hours.  Sepsis Markers No results for input(s): LATICACIDVEN, PROCALCITON, O2SATVEN in the last 168 hours.  Cardiac Enzymes No results for input(s): TROPONINI, PROBNP in the last 168 hours.    The patient is critically ill with multiple organ systems failure and requires high complexity decision making for assessment and support, frequent evaluation and titration of therapies, application of advanced monitoring technologies and extensive interpretation of multiple databases. Critical Care Time devoted to patient care services described in this note is 35 minutes.      Christinia Gully, MD [facility] and [place] [phone] After 5:30 PM or weekends, use Beeper [phone]

## 2020-02-12 NOTE — Progress Notes (Signed)
RN spoke to daughter Jackelyn Poling & gave her updates. The family is upset that the Dr has not called them with information. The family wants to stop radiation as soon as possible if it has had no effect on the patient. RN will pass this information to the day shift RN. Family is expecting phone call from doctor tomorrow morning 02/13/20. Daughter Debbie's # is [phone]

## 2020-02-12 NOTE — Progress Notes (Addendum)
OT Cancellation Note/Discharge  Patient Details Name: Langley Ingalls MRN: [id number] DOB: 07-Jan-1951   Cancelled Treatment:    Reason Eval/Treat Not Completed: Patient not medically ready or clinically appropriate for skilled OT services. Per nurse patient will be discharging home with hospice services. Will discharge from OT caseload.   Reanne Nellums OTR/L   Lawanda Holzheimer 02/12/2020, 10:56 AM

## 2020-02-12 NOTE — Progress Notes (Signed)
Attempted to check in on Shane Avila today.  Currently off the floor.   Will f/u tomorrow.  [name], MD [place] Team [phone]  NO CHARGE NOTE

## 2020-02-13 ENCOUNTER — Ambulatory Visit
Admit: 2020-02-13 | Discharge: 2020-02-13 | Disposition: A | Discharge status: Home / Self Care | Payer: Medicare HMO | Attending: Radiation Oncology | Admitting: Radiation Oncology

## 2020-02-13 ENCOUNTER — Other Ambulatory Visit: Payer: Self-pay | Admitting: *Deleted

## 2020-02-13 ENCOUNTER — Telehealth: Payer: Self-pay | Admitting: Radiation Oncology

## 2020-02-13 ENCOUNTER — Inpatient Hospital Stay (HOSPITAL_COMMUNITY): Payer: Medicare HMO

## 2020-02-13 DIAGNOSIS — I4891 Unspecified atrial fibrillation: Secondary | ICD-10-CM

## 2020-02-13 LAB — CBC WITH DIFFERENTIAL/PLATELET
Abs Immature Granulocytes: 0.09 10*3/uL — ABNORMAL HIGH (ref 0.00–0.07)
Basophils Absolute: 0 10*3/uL (ref 0.0–0.1)
Basophils Relative: 0 %
Eosinophils Absolute: 0 10*3/uL (ref 0.0–0.5)
Eosinophils Relative: 0 %
HCT: 34.7 % — ABNORMAL LOW (ref 39.0–52.0)
Hemoglobin: 9.5 g/dL — ABNORMAL LOW (ref 13.0–17.0)
Immature Granulocytes: 1 %
Lymphocytes Relative: 2 %
Lymphs Abs: 0.2 10*3/uL — ABNORMAL LOW (ref 0.7–4.0)
MCH: 25.5 pg — ABNORMAL LOW (ref 26.0–34.0)
MCHC: 27.4 g/dL — ABNORMAL LOW (ref 30.0–36.0)
MCV: 93 fL (ref 80.0–100.0)
Monocytes Absolute: 0.4 10*3/uL (ref 0.1–1.0)
Monocytes Relative: 4 %
Neutro Abs: 9.9 10*3/uL — ABNORMAL HIGH (ref 1.7–7.7)
Neutrophils Relative %: 93 %
Platelets: 170 10*3/uL (ref 150–400)
RBC: 3.73 MIL/uL — ABNORMAL LOW (ref 4.22–5.81)
RDW: 17.6 % — ABNORMAL HIGH (ref 11.5–15.5)
WBC: 10.6 10*3/uL — ABNORMAL HIGH (ref 4.0–10.5)
nRBC: 0 % (ref 0.0–0.2)

## 2020-02-13 LAB — BASIC METABOLIC PANEL
Anion gap: 4 — ABNORMAL LOW (ref 5–15)
BUN: 49 mg/dL — ABNORMAL HIGH (ref 8–23)
CO2: 26 mmol/L (ref 22–32)
Calcium: 7.9 mg/dL — ABNORMAL LOW (ref 8.9–10.3)
Chloride: 122 mmol/L — ABNORMAL HIGH (ref 98–111)
Creatinine, Ser: 0.63 mg/dL (ref 0.61–1.24)
GFR calc Af Amer: >60 mL/min (ref >60)
GFR calc non Af Amer: >60 mL/min (ref >60)
Glucose, Bld: 140 mg/dL — ABNORMAL HIGH (ref 70–99)
Potassium: 4.7 mmol/L (ref 3.5–5.1)
Sodium: 152 mmol/L — ABNORMAL HIGH (ref 135–145)

## 2020-02-13 LAB — GLUCOSE, CAPILLARY
Glucose-Capillary: 199 mg/dL — ABNORMAL HIGH (ref 70–99)
Glucose-Capillary: 141 mg/dL — ABNORMAL HIGH (ref 70–99)
Glucose-Capillary: 145 mg/dL — ABNORMAL HIGH (ref 70–99)
Glucose-Capillary: 147 mg/dL — ABNORMAL HIGH (ref 70–99)
Glucose-Capillary: 144 mg/dL — ABNORMAL HIGH (ref 70–99)
Glucose-Capillary: 167 mg/dL — ABNORMAL HIGH (ref 70–99)

## 2020-02-13 LAB — MAGNESIUM: Magnesium: 2.3 mg/dL (ref 1.7–2.4)

## 2020-02-13 LAB — TRIGLYCERIDES: Triglycerides: 119 mg/dL (ref <150)

## 2020-02-13 IMAGING — DX DG CHEST 1V PORT
2 series · 2 of 2 positions shown · non-contrast
Comparison: Portable exam [TP] hours compared to [DATE]

CLINICAL DATA: Acute respiratory distress, history COPD, chronic
hypoxemic respiratory failure, hypertension, atrial fibrillation

EXAM:
PORTABLE CHEST 1 VIEW

[chest ap (1 of 2)]
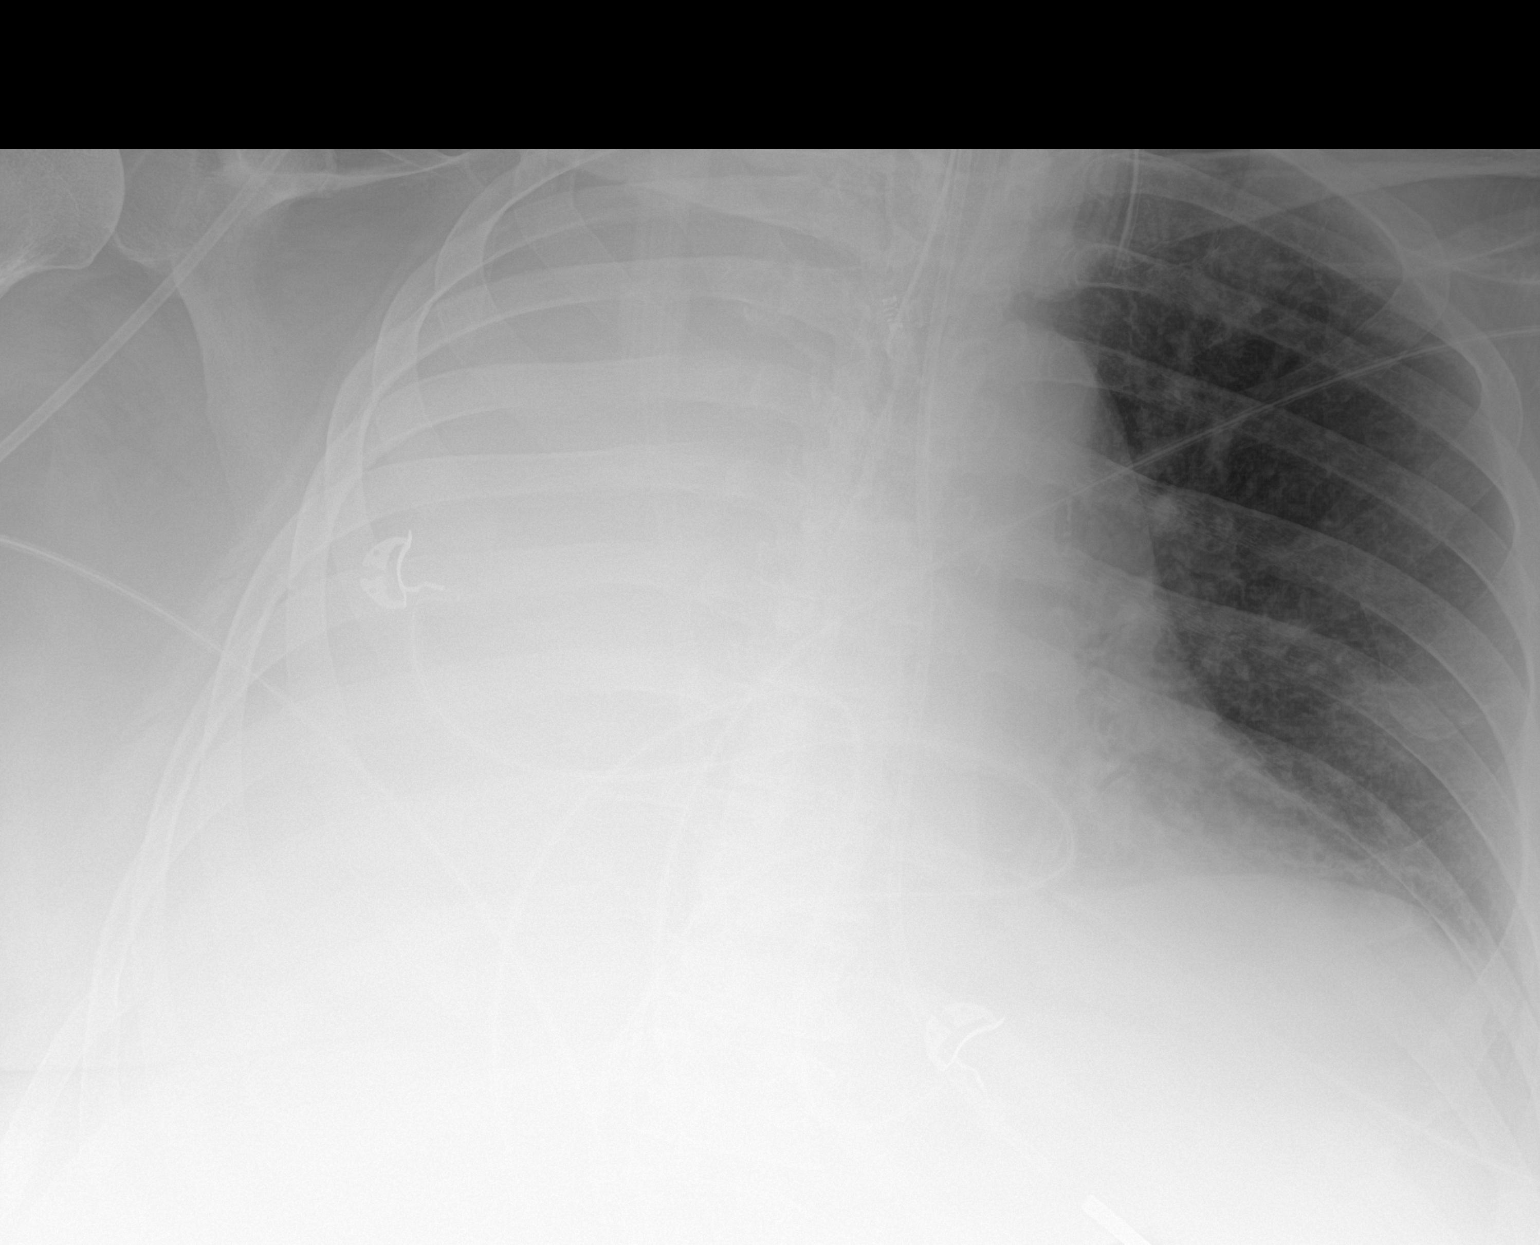

[chest ap (2 of 2)]
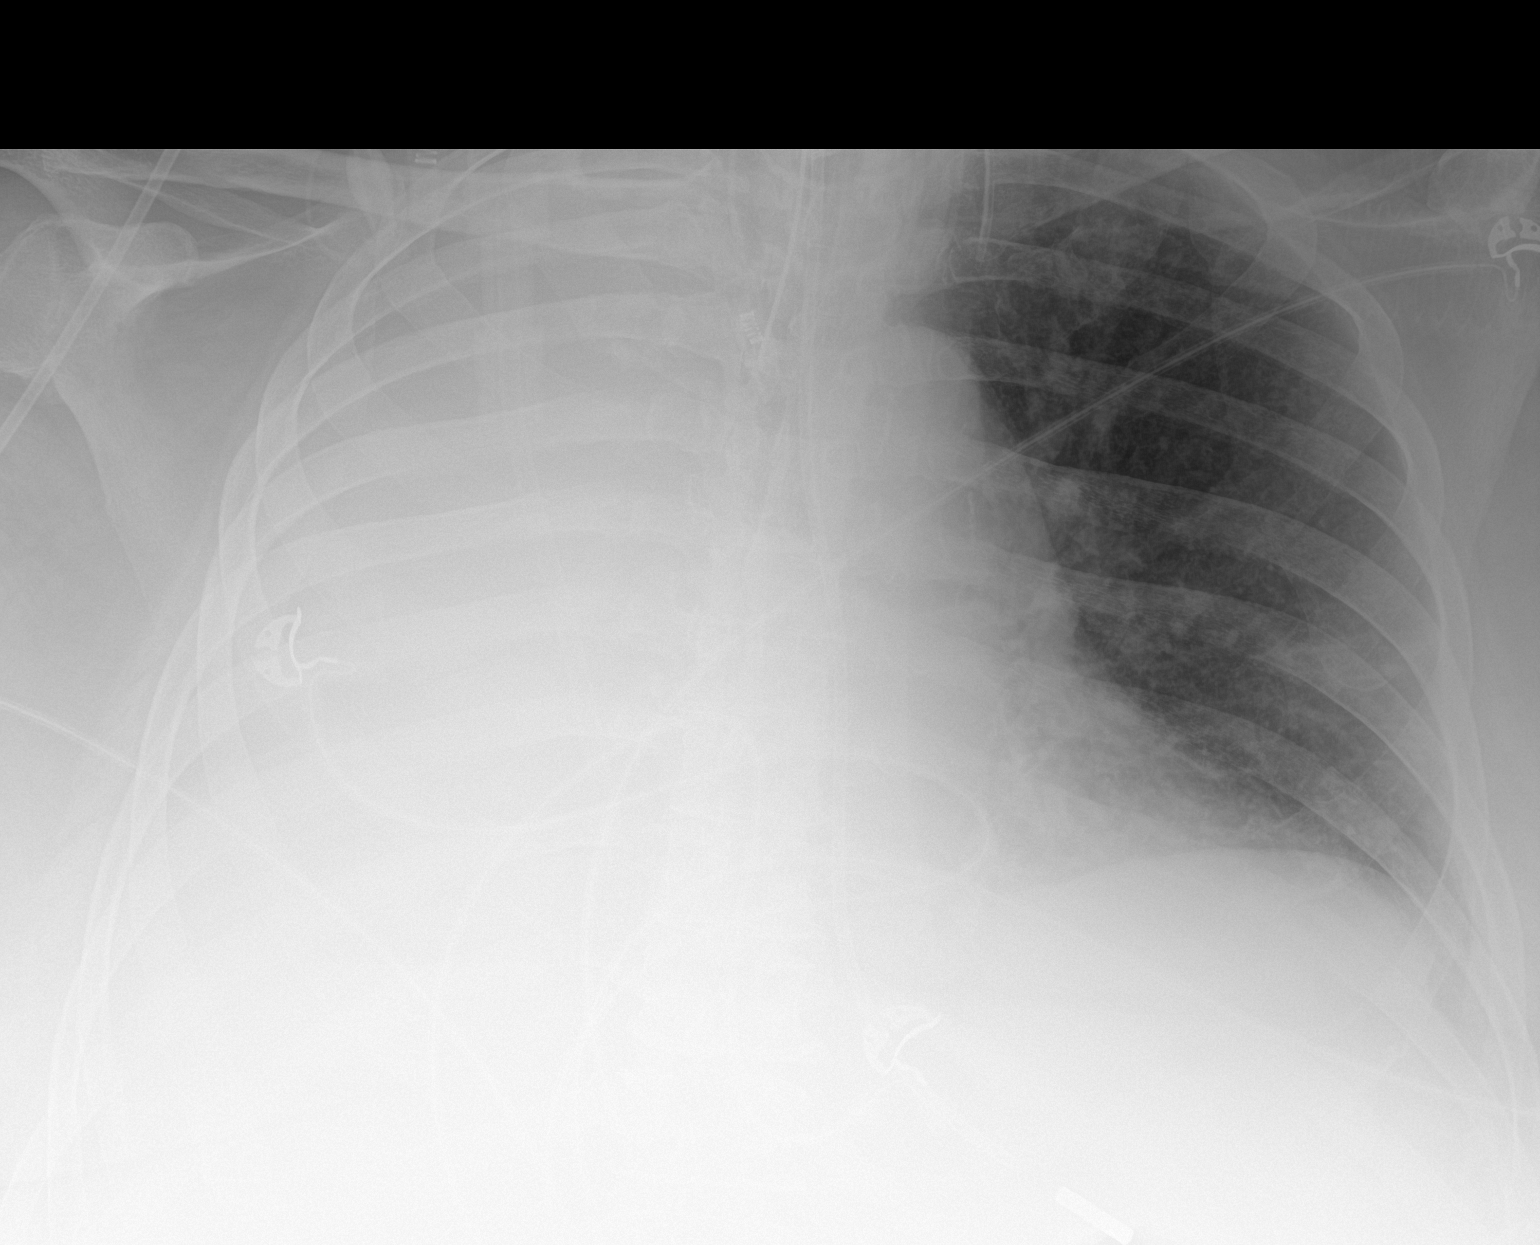

[2 of 2 positions shown; findings below may reference images not displayed]

FINDINGS: Tip of endotracheal tube projects 3.9 cm above carina.

Persistent complete opacification of the RIGHT hemithorax by
combination of effusion and atelectasis versus consolidation.

Subsegmental atelectasis LEFT base.

Remaining LEFT lung clear.

No pneumothorax.
IMPRESSION: Persistent complete opacification of the RIGHT hemithorax by
combination of effusion and atelectasis versus consolidation.

## 2020-02-13 MED ORDER — DEXTROSE 5 % IV SOLN: INTRAVENOUS | Status: DC

## 2020-02-13 MED ORDER — VANCOMYCIN HCL 1500 MG/300ML IV SOLN
1500.0000 mg | Freq: Two times a day (BID) | INTRAVENOUS | Status: DC
  Administered 2020-02-13 – 2020-02-16 (×6): 1500 mg via INTRAVENOUS
  Filled 2020-02-13 (×7): qty 300

## 2020-02-13 MED ORDER — VANCOMYCIN HCL 10 G IV SOLR
2500.0000 mg | Freq: Once | INTRAVENOUS | Status: AC
  Administered 2020-02-13: 2500 mg via INTRAVENOUS
  Filled 2020-02-13: qty 2000

## 2020-02-13 MED ORDER — VANCOMYCIN HCL 1500 MG/300ML IV SOLN
1500.0000 mg | Freq: Two times a day (BID) | INTRAVENOUS | Status: DC
  Filled 2020-02-13: qty 300

## 2020-02-13 MED ORDER — FREE WATER
200.0000 mL | Status: DC
  Administered 2020-02-13 – 2020-02-14 (×5): 200 mL

## 2020-02-13 MED ORDER — SODIUM CHLORIDE 0.9 % IV SOLN
2.0000 g | Freq: Three times a day (TID) | INTRAVENOUS | Status: DC
  Administered 2020-02-13 – 2020-02-16 (×10): 2 g via INTRAVENOUS
  Filled 2020-02-13 (×10): qty 2

## 2020-02-13 NOTE — Progress Notes (Signed)
Pt transported to and from radiation with no complications.

## 2020-02-13 NOTE — Progress Notes (Signed)
The proposed treatment discussed in cancer conference 2/25/21is for discussion purpose only and is not a binding recommendation.  The patient was not physically examined nor present for their treatment options.  Therefore, final treatment plans cannot be decided.  

## 2020-02-13 NOTE — Telephone Encounter (Signed)
It sounds like this has been addressed as documented by Dr. Melvyn Novas this morning, after you had spoken with the daughter and the agreement is to complete radiation treatments this week, take the weekend off and re-evaluate early next week and if no significant improvement in opening up the lung, they will move towards comfort care. Please call the daughter to confirm this information and see if they still have further questions that need to be addressed with Radiation Oncology aside from the under-treat visit later this week. Happy to help if I can but do not think there is much I can add that hasn't already been shared at this point.  Please inform the treatment machine once you have confirmed family wishes to proceed with daily treatment this week. Thank you!!!  Nicholos Johns, MMS, PA-C [facility]: [phone]  Fax: [phone]

## 2020-02-13 NOTE — Progress Notes (Signed)
eLink Physician-Brief Progress Note Patient Name: Shane Avila DOB: 03/03/1951 MRN: [id number]   Date of Service  02/13/2020  HPI/Events of Note  Request for AM lab orders.  eICU Interventions  Will order CBC with platelets, BMP and Mg++ level at 5 AM.      Intervention Category Major Interventions: Other:  Lysle Dingwall 02/13/2020, 4:23 AM

## 2020-02-13 NOTE — Progress Notes (Signed)
Dr Tammi Klippel and I spoke at length on 2/25 and I spoke with Jackelyn Poling (the other daughter who wants to speak for fm as she has medical background) > she  agrees to complete the RT this week, take the weekend off to see if any opening of R lung and if not change to comfort mode/ extubation 3/1 p wife visits this weekend and I have a change to explain directly to her how this all may  Work out as approaching a clear  "fork in the road"  Christinia Gully, MD Pulmonary and [place] [phone] After 5:30 PM or weekends, use Beeper [phone]

## 2020-02-13 NOTE — Telephone Encounter (Addendum)
Received multiple voicemail messages this morning from the family and ICU nurse, Judson Roch. Called back to [phone] and spoke with Asa Saunas and [name] over the speaker phone. I listened as they aired frustration about not having spoken with a physician since last week until this morning and "he said Daddy's lung hasn't opened up." The family goes onto express they don't want their father/husband to suffer. This RN explained these situations can be unpredictable but radiation has been proven to work in time. Reinforced that more time is needed for radiation to be effective. Explained the patient's lung is scanned daily for treatment purposes. When asked if there is change on the daily scan I verbalized my understanding there wasn't much but reinforced again there has not been enough time allowed. Explained their husband/father doesn't feel any pain associated with the radiation treatments.Encouraged at minimum the next three treatments be given to equate to ten then, re-evaluate where their father/husband stands.I answered all the family's questions to the best of my ability. Ultimately, Katharine Look, the patient's wife, requested all radiation treatments be stopped today. Explained this RN would share this information with the providers and the day shift nurse, Judson Roch. Family requesting a call about next steps in care asking "so what happens now." Family request future calls go to [phone] which is Verdis Frederickson, the daughter, phone because she can put everyone on three way.

## 2020-02-13 NOTE — Progress Notes (Signed)
Pharmacy Antibiotic Note  Shane Avila is a 69 y.o. male with lung mass and brain mets currently undergoing XRT. He is known to pharmacy from abx consults with this admission. He completed ten days of azithromycin and zosyn on 2/22 for PNA. Pharmacy was consulted on 01/25/2020 to start vancomycin and cefepime for suspected sepsis.  Today, 02/13/2020: - Tmax 101.2, wbc 10.6 -  scr 0.63 (crcl~100)  Plan: - cefepime 2 gm q8h - vancomycin 2500mg  IV x1, then 1500 mg q12h for est AUC 462 - monitor renal function ____________________________________  Height: 6' (182.9 cm) Weight: (!) 328 lb 11.3 oz (149.1 kg) IBW/kg (Calculated) : 77.6  Temp (24hrs), Avg:100.1 F (37.8 C), Min:99.5 F (37.5 C), Max:101.2 F (38.4 C)  Recent Labs  Lab 02/08/20 0415 02/09/20 0551 02/10/20 0528 02/12/20 0300 02/13/20 0415  WBC 16.7* 13.7* 18.0* 11.9* 10.6*  CREATININE 1.32* 1.16 1.04 0.71 0.63    Estimated Creatinine Clearance: 136.4 mL/min (by C-G formula based on SCr of 0.63 mg/dL).    Allergies  Allergen Reactions  . Codeine Nausea Only  . Xanax [Alprazolam] Other (See Comments)    Patient stated "I don't like the way this makes me feel." He couldn't hold up his head up for 3 days after taking it and made him "ill." patient can tolerate low-dose Clonazepam.    Antimicrobials this admission: 2/12 rocephin x1 2/12 azithromycin>> 2/20 2/13 zosyn >> 2/22 2/25 vanc>> 2/25 cefepime>>     Microbiology results: 2/12 blood- ngF 2/17 MRSA PCR neg 2/13 HIV NR 2/12 Covid neg  Thank you for allowing pharmacy to be a part of this patient's care.  Lynelle Doctor 02/13/2020 9:30 AM

## 2020-02-13 NOTE — Progress Notes (Addendum)
NAME:  Shane Avila, MRN:  [id number], DOB:  December 15, 1951, LOS: 67 ADMISSION DATE:  01/30/2020, CONSULTATION DATE: 02/01/2020 REFERRING MD: Dr. Tyrell Antonio, CHIEF COMPLAINT: Subcarinal mass  Brief History   69 year old former smoker (110 pack years) with a history of COPD and chronic hypoxemic respiratory failure on chronic O2 at 2 L/min.  Also with hypertension, hyperlipidemia, atrial fibrillation and depression.  He  wasundergoing work-up for a subcarinal, right hilar mass that was first discovered on CT chest 11/13/2019 at [facility].   He was evaluated in the ED at [facility] 2/11 for confusion and "bizarre behavior".  CT head revealed a left frontal small hyperdense area, question SAH versus mass.  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  His other work-up revealed acute renal failure (improving).   Underwent Bronchoscopy 2/16 for obstructed right mainstem with Dr. Valeta Harms, with evidence of fully obstructed right lung from tumor originating from the medial portion of the main carina. During procedure patient developed Rapid A-fib with RVR.   If the patient was to have significant amount of bleeding from the endotracheal tube the endotracheal tube should be advanced into the left mainstem for unilateral left-sided lung ventilation.  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, tx to [facility] for XRT 2/19 ETT tube exchange due to blown cuff 2/20 Dex d/cd  2/22 started on stress HC for low bp> changed back to dex per Tammi Klippel  Rec  2/24    Consults:  Neurology PCCM Radiation oncology Tammi Klippel)   Procedures:  Bronchoscopy 2/16, 2/19 ETT 2/16 >> Lt IJ CVL 2/16 >> Lt radial A aline 2/16 > 2/19 RT to Lung 2/17 and 18th held on 19th due to unstable rhythm  RT resumed 2/22   Significant Diagnostic Tests:  CT chest Oval Linsey) 01/30/2020 >> 5.1 x 4.8 cm subcarinal mass that impacts the main carina and extrinsically compresses the right mainstem bronchus, right upper lobe airway and bronchus intermedius.  Unclear whether there is an endobronchial lesion.  Significant right lower lobe volume loss/atelectasis/collapse vs post-obstructive PNA.  Small right effusion MRI Brain w/o 2/12 >> approximate 21m cortically based hemorrhagic mass inovlving the anterior left frontal lobe, mild vasogenic edema w/o significant regional mass effect MRI Brain w/o 2/13 >> motion degraded exam, unchanged 14 mm cortically based hemorrhagic mass  EEG 2/13  wnl  Micro Data:  SARSCov2 2/12 >> negative Blood 2/12 >> negative  Respiratory 2/12 >> never sent  MRSA PCR 2/17 >>> neg BC x 2 2/25 >>> UC  2/25 >>>  Antimicrobials:  Ceftriaxone 2/12 x1 Azithromycin 2/12 > 2/20  Zosyn 2/13 >  2/22 Maxepime 2/25 >>> Vanc  2/25 >>>   Scheduled Meds: . atorvastatin  40 mg Per Tube QHS  . budesonide (PULMICORT) nebulizer solution  0.5 mg Nebulization BID  . chlorhexidine gluconate (MEDLINE KIT)  15 mL Mouth Rinse BID  . Chlorhexidine Gluconate Cloth  6 each Topical Daily  . dexamethasone (DECADRON) injection  4 mg Intravenous Q6H  . digoxin  0.25 mg Per Tube Daily  . diltiazem  60 mg Per Tube Q6H  .  docusate  50 mg Per Tube Daily  . feeding supplement (PRO-STAT SUGAR FREE 64)  60 mL Per Tube QID  . folic acid  1 mg Per Tube Daily  . free water  200 mL Per Tube Q6H  . insulin aspart  0-9 Units Subcutaneous Q4H  . levalbuterol  0.63 mg Nebulization Q6H  . mouth rinse  15 mL Mouth Rinse 10 times per day  . metoprolol tartrate  25 mg Oral BID   . mupirocin cream   Topical Daily  . pantoprazole sodium  40 mg Per Tube Daily  . sennosides  5 mL Per Tube Daily  . sodium chloride flush  10-40 mL Intracatheter Q12H  . vitamin B-12  1,000 mcg Per Tube Daily   Continuous Infusions: . sodium chloride 10 mL/hr at 02/13/20 0411  . diltiazem (CARDIZEM) infusion 15 mg/hr (02/13/20 0411)  . feeding supplement (PIVOT 1.5 CAL) 40 mL/hr at 02/12/20 2000  . fentaNYL infusion INTRAVENOUS 100 mcg/hr (02/13/20 0411)  . propofol (DIPRIVAN) infusion 10 mcg/kg/min (02/13/20 0411)   PRN Meds:.sodium chloride, acetaminophen **OR** acetaminophen, docusate, fentaNYL, metoprolol tartrate, ondansetron **OR** ondansetron (ZOFRAN) IV, sodium chloride flush   Interim history/subjective:  Sedated on vent / increased bleeding per ET / new  fever off abx   Objective   Blood pressure 125/80, pulse 88, temperature (!) 101.2 F (38.4 C), temperature source Axillary, resp. rate 13, height 6' (1.829 m), weight (!) 149.1 kg, SpO2 93 %. CVP:  [8 mmHg-10 mmHg] 9 mmHg  Vent Mode: PRVC FiO2 (%):  [30 %-40 %] 40 % Set Rate:  [12 bmp] 12 bmp Vt Set:  [250 mL] 620 mL PEEP:  [5 cmH20] 5 cmH20 Plateau Pressure:  [20 cmH20-23 cmH20] 23 cmH20   Intake/Output Summary (Last 24 hours) at 02/13/2020 0900 Last data filed at 02/13/2020 0757 Gross per 24 hour  Intake 3038.78 ml  Output 3725 ml  Net -686.22 ml   Filed Weights   02/11/20 0520 02/12/20 0406 02/13/20 0413  Weight: 130.5 kg 130.5 kg (!) 149.1 kg      Examination: Obese wm nad on vent/ fentanyl /propofal drip  No jvd Oropharynx et  Neck supple Lungs with a junky insp/exp  rhonchi bilaterally RRR no s3 or or sign murmur Abd obese with very limited  excursion  Extr warm with trace  Edema   pCXR 2/25 > no better aeration on R            Resolved Hospital Problem list   Acute renal failure   Assessment & Plan:   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 -case reviewed with Neurosurgery, given no contrasted images and inability to determine actual size of mass vs contribution of hemorrhage. Dr. Nickolas Madrid ([place]) made aware of case as well to follow peripherally / for Tumor Board review, appreciate assistance. -underwent video bronch 2/16  -Remained intubated post bronchoscopy due to significant tumor presence  Plan: Minimal  endotracheal suctioning - ok to suction the et to just beyond the level of the end (reviewed with RT) If bleeding occurs advance ETT into left mainstem NOT RIGHT RT restarted 2/22 pm and q weekday thereafter/ discussed logistics with Dr Tammi Klippel who feels the pt should be back on dex due to head met not planning on CNS Radiation for now    Acute respiratory failure with hypoxema/ vent dep due to obst RMSB/MO? /copd  Wean PEEP and FiO2 for sats greater than 90%.per  ards protocol  Head of bed elevated 30 degrees. Plateau pressures less than 30 cm H20 > achieved  SAT/SBT as tolerated  Ensure adequate pulmonary hygiene  VAP bundle in place  PAD protocol Continue nebs     Suspected postobstructive pneumonia with right lung collapse  Plan:  d/c'd  zosyn 2/22 p completing 10 days rx >>  rec check pct if clinically looks infected >>> rx RT continue, no evidence of improved aeration of R lung but explained to fm 2/22 way to soon to "see if we'll win this battle... winning the war is another issue and much tougher fight" ie the big picture is not looking good but short term goal may still be achievable > get him off vent  - new fever 2/25 so repeat cultures/ start vanc/maxepime    Anemia Lab Results  Component Value Date   HGB 9.5 (L) 02/13/2020   HGB 9.7 (L) 02/12/2020   HGB 10.6 (L) 02/10/2020    Plan Trend CBC Monitor for bleeding  Transfuse for HGB < 7 D/c'd lovenox 2/22 due to risk of recurrent bleeding airway/ brain met   COPD Plan: Changed brovana to xopenex qid 2/22 due to RAF D/c 'd  pulmicort as already on Dex and risk iritating airway with high dose ICS       Circulatory shock  - CVP:  [8 mmHg-10 mmHg] 9 mmHg Remains off neo/ euvolemic by cvp     New onset Atrial Fibrillation  -Developed during bronch  P: Continue Cardizem IV drip plus  Lopressor per ft   Added digoxin 2/23 > improved rate control, continue daily rx per pharmacy dosing  Much better rate control despite fever     Acute metabolics Encephalopathy   -Suspect largely due to his left frontal lobe lesion.  Plan: Neurology following  Seizure precautions   Decadron stopped 2/20 so added [facility] 2/22 > changed to dex 2/24 at RT([place]) rec  Stopped  kepra 2/23 with  EEG nl    Goals of care Plan: Ongoing discussion with family > they are conflicted about how low to do RT before deciding about changing to comfort care vs trach and continue complete RT.   Currently DNR Palliative care following  Fm to discuss RT directly with Dr Tammi Klippel with questions regarding expectations for opening the airway > my present understanding is 10 treatments then regroup    Nutrition Plan: Continue tube feeds.   Hypernatremia acute new since admit - rx free water 2/24 but Na up further am 2/25 with neg I/0s  so rx increase free water and add d5W as well as add lantus is hyperglycemia becomes an issue    Best practice:  Diet: N.p.o.,  tube feed Pain/Anxiety/Delirium protocol (if indicated): Propofol, fentanyl VAP protocol (if indicated): Ordered DVT prophylaxis: SCDs only for now given bleeding risk brain and R MSB  GI prophylaxis: Protonix Glucose control:Monitor Mobility: Bed Code Status: Full Family Communication:  Daughter and wife updated 2/22  And directed to Dr Tammi Klippel 2/24 Dr Tammi Klippel and I spoke at length on 2/25 and I spoke with Jackelyn Poling (the other daughter who wants to speak for fm as she has medical background > agrees to complete the RT this week, take the weekend off to see if any opening of R lung  and if not change to comfort mode/ extubation 3/1   Disposition: ICU       LABS  Glucose Recent Labs  Lab 02/12/20 1213 02/12/20 1726 02/12/20 1912 02/12/20 2301 02/13/20  0350 02/13/20 0735  GLUCAP 151* 132* 134* 199* 141* 145*    BMET Recent Labs  Lab 02/10/20 0528 02/12/20 0300 02/13/20 0415  NA 146* 149* 152*  K 4.4 4.6 4.7  CL 116* 118* 122*  CO2 24 26 26   BUN 59* 50* 49*  CREATININE 1.04 0.71 0.63  GLUCOSE 112* 136* 140*    Liver Enzymes Recent Labs  Lab 02/08/20 0415  AST 11*  ALT 13  ALKPHOS 65  BILITOT 0.6  ALBUMIN 2.2*    Electrolytes Recent Labs  Lab 02/09/20 0551 02/09/20 0551 02/09/20 1700 02/10/20 0528 02/12/20 0300 02/13/20 0415  CALCIUM 8.7*   < >  --  8.8* 8.4* 7.9*  MG 2.8*   < > 2.6* 2.4  --  2.3  PHOS 3.5  --  3.9 2.7  --   --    < > = values in this interval not displayed.    CBC Recent Labs  Lab 02/10/20 0528 02/12/20 0300 02/13/20 0415  WBC 18.0* 11.9* 10.6*  HGB 10.6* 9.7* 9.5*  HCT 36.4* 33.9* 34.7*  PLT PLATELET CLUMPS NOTED ON SMEAR, COUNT APPEARS ADEQUATE 160 170    ABG No results for input(s): PHART, PCO2ART, PO2ART in the last 168 hours.  Coag's No results for input(s): APTT, INR in the last 168 hours.  Sepsis Markers No results for input(s): LATICACIDVEN, PROCALCITON, O2SATVEN in the last 168 hours.  Cardiac Enzymes No results for input(s): TROPONINI, PROBNP in the last 168 hours.    The patient is critically ill with multiple organ systems failure and requires high complexity decision making for assessment and support, frequent evaluation and titration of therapies, application of advanced monitoring technologies and extensive interpretation of multiple databases. Critical Care Time devoted to patient care services described in this note is 40 minutes.      Christinia Gully, MD Pulmonary and [place] [phone] After 5:30 PM or weekends, use Beeper  [phone]

## 2020-02-14 ENCOUNTER — Ambulatory Visit
Admit: 2020-02-14 | Discharge: 2020-02-14 | Disposition: A | Discharge status: Home / Self Care | Payer: Medicare HMO | Attending: Radiation Oncology | Admitting: Radiation Oncology

## 2020-02-14 DIAGNOSIS — J181 Lobar pneumonia, unspecified organism: Secondary | ICD-10-CM

## 2020-02-14 LAB — BASIC METABOLIC PANEL
Anion gap: 6 (ref 5–15)
BUN: 57 mg/dL — ABNORMAL HIGH (ref 8–23)
CO2: 27 mmol/L (ref 22–32)
Calcium: 8.8 mg/dL — ABNORMAL LOW (ref 8.9–10.3)
Chloride: 118 mmol/L — ABNORMAL HIGH (ref 98–111)
Creatinine, Ser: 0.66 mg/dL (ref 0.61–1.24)
GFR calc Af Amer: >60 mL/min (ref >60)
GFR calc non Af Amer: >60 mL/min (ref >60)
Glucose, Bld: 194 mg/dL — ABNORMAL HIGH (ref 70–99)
Potassium: 5.3 mmol/L — ABNORMAL HIGH (ref 3.5–5.1)
Sodium: 151 mmol/L — ABNORMAL HIGH (ref 135–145)

## 2020-02-14 LAB — CBC
HCT: 36.5 % — ABNORMAL LOW (ref 39.0–52.0)
Hemoglobin: 10.1 g/dL — ABNORMAL LOW (ref 13.0–17.0)
MCH: 25.6 pg — ABNORMAL LOW (ref 26.0–34.0)
MCHC: 27.7 g/dL — ABNORMAL LOW (ref 30.0–36.0)
MCV: 92.4 fL (ref 80.0–100.0)
Platelets: 160 10*3/uL (ref 150–400)
RBC: 3.95 MIL/uL — ABNORMAL LOW (ref 4.22–5.81)
RDW: 17.8 % — ABNORMAL HIGH (ref 11.5–15.5)
WBC: 13.8 10*3/uL — ABNORMAL HIGH (ref 4.0–10.5)
nRBC: 0 % (ref 0.0–0.2)

## 2020-02-14 LAB — TRIGLYCERIDES: Triglycerides: 95 mg/dL (ref <150)

## 2020-02-14 LAB — GLUCOSE, CAPILLARY
Glucose-Capillary: 164 mg/dL — ABNORMAL HIGH (ref 70–99)
Glucose-Capillary: 165 mg/dL — ABNORMAL HIGH (ref 70–99)
Glucose-Capillary: 173 mg/dL — ABNORMAL HIGH (ref 70–99)
Glucose-Capillary: 153 mg/dL — ABNORMAL HIGH (ref 70–99)
Glucose-Capillary: 157 mg/dL — ABNORMAL HIGH (ref 70–99)
Glucose-Capillary: 135 mg/dL — ABNORMAL HIGH (ref 70–99)

## 2020-02-14 LAB — URINE CULTURE
Culture: NO GROWTH
Special Requests: NORMAL

## 2020-02-14 MED ORDER — INSULIN GLARGINE 100 UNIT/ML ~~LOC~~ SOLN
10.0000 [IU] | Freq: Two times a day (BID) | SUBCUTANEOUS | Status: DC
  Administered 2020-02-14 – 2020-02-16 (×5): 10 [IU] via SUBCUTANEOUS
  Filled 2020-02-14 (×7): qty 0.1

## 2020-02-14 MED ORDER — VITAL HIGH PROTEIN PO LIQD
1000.0000 mL | ORAL | Status: DC
  Administered 2020-02-14 – 2020-02-16 (×3): 1000 mL

## 2020-02-14 MED ORDER — FREE WATER
400.0000 mL | Status: DC
  Administered 2020-02-14 – 2020-02-16 (×11): 400 mL

## 2020-02-14 MED ORDER — ADULT MULTIVITAMIN LIQUID CH
15.0000 mL | Freq: Every day | ORAL | Status: DC
  Administered 2020-02-14 – 2020-02-15 (×2): 15 mL
  Filled 2020-02-14 (×2): qty 15

## 2020-02-14 MED ORDER — BISOPROLOL FUMARATE 5 MG PO TABS
5.0000 mg | ORAL_TABLET | Freq: Two times a day (BID) | ORAL | Status: DC
  Administered 2020-02-14 – 2020-02-16 (×5): 5 mg via ORAL
  Filled 2020-02-14 (×5): qty 1

## 2020-02-14 MED ORDER — FUROSEMIDE 10 MG/ML IJ SOLN
40.0000 mg | Freq: Once | INTRAMUSCULAR | Status: AC
  Administered 2020-02-14: 40 mg via INTRAVENOUS
  Filled 2020-02-14: qty 4

## 2020-02-14 NOTE — Progress Notes (Signed)
[place] Radiation Oncology Dept Therapy Treatment Record Phone [phone]   Radiation Therapy was administered to Shane Avila on: 02/14/2020  4:13 PM and was treatment # 5 out of a planned course of 10 treatments.  Radiation Treatment  1). Beam photons with 6-10 energy  2). Brachytherapy None  3). Stereotactic Radiosurgery None  4). Other Radiation None     Shane Avila [name], RT (T)

## 2020-02-14 NOTE — Consult Note (Signed)
[facility] Nurse Consult Note: Reason for Consult: Small area of hyperpigmentation, non blanching. DTPI vs SCALE (Skin changes at life's end) to left buttock.  Entire buttocks are mottled, there are dry lesions at contralateral sides of gluteal cleft that are not open. Wound type: DTPI vs SCALE at left buttock, friction injury at gluteal cleft Pressure Injury POA: No Measurement: 1cm round area of deeper pigmented skin than surround skin, non blanching Wound bed:N/A Drainage (amount, consistency, odor) N/A Periwound:N/A Dressing procedure/placement/frequency: Patient is on a therapeutic mattress replacement with low air loss feature, is critically ill. Turning and repositioning is in place. He is intubated.  He is undergoing palliative radiation therapy, see notes for POC and family wishes.  [place] nursing team will follow, seeing every 7-10 days and will remain available to this patient, the nursing and medical teams.  Please re-consult if needed between visits. Thanks, Maudie Flakes, MSN, RN, [place], Arther Abbott  Pager# [phone]

## 2020-02-14 NOTE — Progress Notes (Signed)
  Radiation Oncology         [phone]) [phone] ________________________________  Name: Shane Avila MRN: [id number]  Date: 02/07/2020  DOB: 05-30-1951  Chart Note:  Mr. Shane Avila has completed 8 radiation treatments to his chest in hopes of helping reopen his right mainstem bronchus.  He has been ventilator dependent throughout the course of his radiation treatment.  We image his chest with cone beam CT as shown below prior to each fraction for treatment of alignment.  Throughout his treatment, his right lung has remained opacified.  On the imaging yesterday and today, a few small air bubbles became visible within the right opacified lung near the hilum, potentially beginning to represent some opening of the airway.  It is not clear whether the small air pockets will expand or not.  I will also note as shown in the image below, the patient has also developed what appears to be airspace disease in the left lower lung, radiographically consistent with pneumonia.  I spoke with Dr. Shyrl Avila this week, to review current treatment plans, prognosis, and patient disposition.  Additional radiation therapy may or may not lead to reopening of the right mainstem bronchus to facilitate extubation.  The patient's prognosis remains poor at this point and his performance status at baseline and currently is very low.  It is my understanding that the patient family will review whether or not to continue radiation treatment or move towards comfort care this weekend.  We will support their decision, either way.   ________________________________  Sheral Apley. Tammi Klippel, M.D.

## 2020-02-14 NOTE — Progress Notes (Signed)
NAME:  Shane Avila, MRN:  [id number], DOB:  Feb 21, 1951, LOS: 23 ADMISSION DATE:  02/02/2020, CONSULTATION DATE: 02/01/2020 REFERRING MD: Dr. Tyrell Antonio, CHIEF COMPLAINT: Subcarinal mass  Brief History   69 year old former smoker (110 pack years) with a history of COPD and chronic hypoxemic respiratory failure on chronic O2 at 2 L/min.  Also with hypertension, hyperlipidemia, atrial fibrillation and depression.  He  wasundergoing work-up for a subcarinal, right hilar mass that was first discovered on CT chest 11/13/2019 at [place].   He was evaluated in the ED at [facility] 2/11 for confusion and "bizarre behavior".  CT head revealed a left frontal small hyperdense area, question SAH versus mass.  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  His other work-up revealed acute renal failure (improving).   Underwent Bronchoscopy 2/16 for obstructed right mainstem with Dr. Valeta Harms, with evidence of fully obstructed right lung from tumor originating from the medial portion of the main carina. During procedure patient developed Rapid A-fib with RVR.   If the patient was to have significant amount of bleeding from the endotracheal tube the endotracheal tube should be advanced into the left mainstem for unilateral left-sided lung ventilation.  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, tx to [facility] for XRT 2/19 ETT tube exchange due to blown cuff 2/20 Dex d/cd  2/22 started on stress HC for low bp> changed back to dex per Tammi Klippel  Rec  2/24    Consults:  Neurology PCCM Radiation oncology Tammi Klippel)   Procedures:  Bronchoscopy 2/16, 2/19 ETT 2/16 >> Lt IJ CVL 2/16 >> Lt radial A aline 2/16 > 2/19 RT to Lung 2/17 and 18th held on 19th due to unstable rhythm  RT resumed 2/22 - last rx 2/26  D/c cardizem drip 2/5   Significant Diagnostic Tests:  CT chest Oval Linsey) 01/30/2020 >> 5.1 x 4.8 cm subcarinal mass that impacts the main carina and extrinsically compresses the right mainstem bronchus, right upper lobe airway and bronchus intermedius.  Unclear whether there is an endobronchial lesion.  Significant right lower lobe volume loss/atelectasis/collapse vs post-obstructive PNA.  Small right effusion MRI Brain w/o 2/12 >> approximate 51m cortically based hemorrhagic mass inovlving the anterior left frontal lobe, mild vasogenic edema w/o significant regional mass effect MRI Brain w/o 2/13 >> motion degraded exam, unchanged 14 mm cortically based hemorrhagic mass  EEG 2/13  wnl  Micro Data:  SARSCov2 2/12 >> negative Blood 2/12 >> negative  Respiratory 2/12 >> never sent  MRSA PCR 2/17 >>> neg BC x 2 2/25 >>> UC  2/25 >>> Trach asp 2/25 >> few wbc's no org seen >>>  Antimicrobials:  Ceftriaxone 2/12 x1 Azithromycin 2/12 > 2/20  Zosyn 2/13 >  2/22 Maxepime 2/25 >>> Vanc  2/25 >>>   Scheduled Meds: . atorvastatin  40 mg Per Tube QHS  . chlorhexidine gluconate (MEDLINE KIT)  15 mL Mouth Rinse BID  . Chlorhexidine Gluconate Cloth  6 each Topical Daily  . dexamethasone (DECADRON) injection  4 mg Intravenous Q6H  . digoxin  0.25 mg Per Tube Daily  .  diltiazem  60 mg Per Tube Q6H  . docusate  50 mg Per Tube Daily  . feeding supplement (PRO-STAT SUGAR FREE 64)  60 mL Per Tube QID  . folic acid  1 mg Per Tube Daily  . free water  200 mL Per Tube Q4H  . insulin aspart  0-9 Units Subcutaneous Q4H  . levalbuterol  0.63 mg Nebulization Q6H  . mouth rinse  15 mL Mouth Rinse 10 times per day  . metoprolol tartrate   25 mg Oral BID  . mupirocin cream   Topical Daily  . pantoprazole sodium  40 mg Per Tube Daily  . sennosides  5 mL Per Tube Daily  . sodium chloride flush  10-40 mL Intracatheter Q12H  . vitamin B-12  1,000 mcg Per Tube Daily   Continuous Infusions: . sodium chloride Stopped (02/13/20 0957)  . ceFEPime (MAXIPIME) IV Stopped (02/14/20 0157)  . dextrose 75 mL/hr at 02/14/20 0700  . diltiazem (CARDIZEM) infusion Stopped (02/13/20 1342)  . feeding supplement (PIVOT 1.5 CAL) 40 mL/hr at 02/12/20 2000  . fentaNYL infusion INTRAVENOUS Stopped (02/14/20 0408)  . propofol (DIPRIVAN) infusion 15 mcg/kg/min (02/14/20 0700)  . vancomycin Stopped (02/14/20 0042)   PRN Meds:.sodium chloride, acetaminophen **OR** acetaminophen, docusate, fentaNYL, metoprolol tartrate, ondansetron **OR** ondansetron (ZOFRAN) IV, sodium chloride flush   Interim history/subjective:   sedated on vent/ more rapid afib this am p dc cardizem drip 2/25  No fever since am 2/25   Objective   Blood pressure 125/69, pulse 72, temperature 98.7 F (37.1 C), temperature source Oral, resp. rate 15, height 6' (1.829 m), weight (!) 148.2 kg, SpO2 (!) 87 %. CVP:  [10 mmHg-22 mmHg] 22 mmHg  Vent Mode: PRVC FiO2 (%):  [40 %] 40 % Set Rate:  [12 bmp] 12 bmp Vt Set:  [151 mL] 620 mL PEEP:  [5 cmH20] 5 cmH20 Plateau Pressure:  [18 cmH20-25 cmH20] 20 cmH20   Intake/Output Summary (Last 24 hours) at 02/14/2020 0901 Last data filed at 02/14/2020 0700 Gross per 24 hour  Intake 3828.98 ml  Output 3305 ml  Net 523.98 ml   Filed Weights   02/12/20 0406 02/13/20 0413 02/14/20 0452  Weight: 130.5 kg (!) 149.1 kg (!) 148.2 kg      Examination:  Obese wm sedated on vent  Pt alert, approp nad @ 30 degrees hob No jvd Oropharynx et Neck supple Lungs with a coarse insp and  Exp rhonchi bilaterally RRR no s3 or or sign murmur Abd obese with poor  excursion  Extr warm with trace pitting  edema / pas in place          Resolved  Hospital Problem list   Acute renal failure   Assessment & Plan:   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 -case reviewed with Neurosurgery, given no contrasted images and inability to determine actual size of mass vs contribution of hemorrhage. Dr. Nickolas Madrid ([place]) made aware of case as well to follow peripherally / for Tumor Board review, appreciate assistance. -underwent video bronch 2/16  -Remained intubated post bronchoscopy due to significant tumor presence  Plan: Minimal  endotracheal suctioning - ok to suction the et to just beyond the level of the end (reviewed with RT) If bleeding occurs advance ETT into left mainstem NOT RIGHT RT restarted 2/22 pm and q weekday thereafter/ discussed logistics with Dr Tammi Klippel who feels the pt should be back on dex due to head met  not planning on CNS Radiation for now - plan is to stop RT 2/26 and give him the weekend off, have fm visit, and if not improving consider shift to comfort care and terminally wean 2/28 or 3/1  (fm seeking more info from palliative care as to how this is going to work     Acute respiratory failure with hypoxema/ vent dep due to obst RMSB/MO? /copd  Wean PEEP and FiO2 for sats greater than 90%.per ards protocol  Head of bed elevated 30 degrees. Plateau pressures less than 30 cm H20 > achieved  SAT/SBT as tolerated  Ensure adequate pulmonary hygiene  VAP bundle in place  PAD protocol Continue nebs     Suspected postobstructive pneumonia with right lung collapse  Plan:  d/c'd  zosyn 2/22 p completing 10 days rx >>  rec check pct if clinically looks infected >>> rx RT continue, no evidence of improved aeration of R lung but explained to fm 2/22 way to soon to "see if we'll win this battle... winning the war is another issue and much tougher fight" ie the big picture is not looking good but short term goal may still be achievable > get him off vent  - new fever  2/25 so repeat cultures/ started vanc/maxepime per flow sheet     Anemia Lab Results  Component Value Date   HGB 10.1 (L) 02/14/2020   HGB  02/14/2020    QUESTIONABLE RESULTS, RECOMMEND RECOLLECT TO VERIFY   HGB 9.5 (L) 02/13/2020    Plan Trend CBC Monitor for bleeding  Transfuse for HGB < 7 D/c'd lovenox 2/22 due to risk of recurrent bleeding airway/ brain met   COPD Plan: Changed brovana to xopenex qid 2/22 due to RAF D/c 'd pulmicort as already on Dex and risk iritating airway with high dose ICS       Circulatory shock  - CVP:  [10 mmHg-22 mmHg] 22 mmHg Remains off neo/ developing edema so diuresis ok now      New onset Atrial Fibrillation  -Developed during bronch  P: Continue Cardizem IV drip stopped 2/25 so just on cardizem / Lopressor per ft   Added digoxin 2/23 > improved rate control, continue daily rx per pharmacy dosing  >>> change to bisoprolol for most selective BB available am 2/26       Acute metabolics Encephalopathy   -Suspect largely due to his left frontal lobe lesion.  Plan: Neurology following  Seizure precautions   Decadron stopped 2/20 so added [facility] 2/22 > changed back to dex 2/24 at RT([place]) rec  Stopped  kepra 2/23 with  EEG nl    Goals of care Plan: Currently DNR Palliative care following  ? Wean off vent 2/28 pm or 3/1 am if not making progress     Nutrition Plan: Continue tube feeds/ added lantus 2/26 .   Hypernatremia acute new since admit - rx free water 2/24 but Na up further am 2/26 so rx increase free water and d5w 2/26   Best practice:  Diet: N.p.o.,  tube feed Pain/Anxiety/Delirium protocol (if indicated): Propofol, fentanyl VAP protocol (if indicated): Ordered DVT prophylaxis: SCDs only for now given bleeding risk brain and R MSB  GI prophylaxis: Protonix Glucose control:Monitor Mobility: Bed Code Status: Full Family Communication:  Discussed directly with pt's wife Disposition: ICU       LABS   Glucose Recent Labs  Lab 02/13/20 1149 02/13/20 1530 02/13/20 1950 02/13/20 2317 02/14/20 0453 02/14/20 0735  GLUCAP 147* 144* 164*  167* 165* 173*    BMET Recent Labs  Lab 02/13/20 0415 02/14/20 0445 02/14/20 0722  NA 152* QUESTIONABLE RESULTS, RECOMMEND RECOLLECT TO VERIFY 151*  K 4.7 QUESTIONABLE RESULTS, RECOMMEND RECOLLECT TO VERIFY 5.3*  CL 122* QUESTIONABLE RESULTS, RECOMMEND RECOLLECT TO VERIFY 118*  CO2 26 QUESTIONABLE RESULTS, RECOMMEND RECOLLECT TO VERIFY 27  BUN 49* QUESTIONABLE RESULTS, RECOMMEND RECOLLECT TO VERIFY 57*  CREATININE 0.63 QUESTIONABLE RESULTS, RECOMMEND RECOLLECT TO VERIFY 0.66  GLUCOSE 140* QUESTIONABLE RESULTS, RECOMMEND RECOLLECT TO VERIFY 194*    Liver Enzymes Recent Labs  Lab 02/08/20 0415  AST 11*  ALT 13  ALKPHOS 65  BILITOT 0.6  ALBUMIN 2.2*    Electrolytes Recent Labs  Lab 02/09/20 0551 02/09/20 0551 02/09/20 1700 02/10/20 0528 02/12/20 0300 02/13/20 0415 02/14/20 0445 02/14/20 0722  CALCIUM 8.7*   < >  --  8.8*   < > 7.9* QUESTIONABLE RESULTS, RECOMMEND RECOLLECT TO VERIFY 8.8*  MG 2.8*   < > 2.6* 2.4  --  2.3  --   --   PHOS 3.5  --  3.9 2.7  --   --   --   --    < > = values in this interval not displayed.    CBC Recent Labs  Lab 02/13/20 0415 02/14/20 0445 02/14/20 0722  WBC 10.6* QUESTIONABLE RESULTS, RECOMMEND RECOLLECT TO VERIFY 13.8*  HGB 9.5* QUESTIONABLE RESULTS, RECOMMEND RECOLLECT TO VERIFY 10.1*  HCT 34.7* QUESTIONABLE RESULTS, RECOMMEND RECOLLECT TO VERIFY 36.5*  PLT 170 QUESTIONABLE RESULTS, RECOMMEND RECOLLECT TO VERIFY 160    ABG No results for input(s): PHART, PCO2ART, PO2ART in the last 168 hours.  Coag's No results for input(s): APTT, INR in the last 168 hours.  Sepsis Markers No results for input(s): LATICACIDVEN, PROCALCITON, O2SATVEN in the last 168 hours.  Cardiac Enzymes No results for input(s): TROPONINI, PROBNP in the last 168 hours.    The patient is critically ill with  multiple organ systems failure and requires high complexity decision making for assessment and support, frequent evaluation and titration of therapies, application of advanced monitoring technologies and extensive interpretation of multiple databases. Critical Care Time devoted to patient care services described in this note is  35  minutes.      Christinia Gully, MD Pulmonary and [place] [phone] After 5:30 PM or weekends, use [facility] [phone]

## 2020-02-14 NOTE — Progress Notes (Signed)
Nutrition Follow-up  DOCUMENTATION CODES:   Morbid obesity  INTERVENTION:  - will adjust TF regimen: Vital High Protein @ 40 ml/hr with 60 ml prostat QID. - this regimen + kcal from current propofol rate will provide 2087 kcal (103% estimated kcal need), 204 grams protein, and 802 ml free water.  - will order 15 ml multivitamin/day.  - free water flush to be per MD/NP.    NUTRITION DIAGNOSIS:   Inadequate oral intake related to inability to eat as evidenced by NPO status. -ongoing  GOAL:   Provide needs based on ASPEN/SCCM guidelines -met with TF regimen  MONITOR:   Vent status, TF tolerance, Labs, Weight trends, Skin  ASSESSMENT:   69 y.o. male with medical history of A. fib, R lung mass, and smoker. He presented to [facility] due to insomnia x4 days, chronic cough. His family reported that he was diagnosed with lung mass that he was to have biopsied but he went into cardiac arrest during initial attempt. Biopsy was re-scheduled but patient missed the appointment. His wife reported that he has been acting bizarre and talking nonsense.  Patient remains intubated with small bore NGT in place. He is receiving Pivot 1.5 @ 40 ml/hr with 60 ml prostat QID and 400 ml free water every 4 hours (free water starting today at 1200). This regimen provides 2240 kcal, 210 grams protein, and 3120 ml free water.   Per chart review, weight today is +5.6 kg/13 lb compared to admission (2/13) weight. Moderate pitting edema to BUE and deep pitting edema to BLE.  Free water flush was not ordered by RD.   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 Medications reviewed; 50 mg colace/day, 1 mg folvite/day, 40 mg IV lasix/day, sliding scale novolog, 10 units lantus BID, 40 mg protonix per tube/day, 5 ml senokot/day, 1000 mcg cyanocobalamin per tube/day.  IVF; D5 @ 100 ml/hr (408 kcal). Drips; propofol @ 15 mcg/kg/min, fentanyl @ 100 mcg/hr.     Diet Order:   Diet Order            Diet NPO time specified  Diet effective midnight              EDUCATION NEEDS:   No education needs have been identified at this time  Skin:  Skin Assessment: Skin Integrity Issues: Skin Integrity Issues:: DTI DTI: L buttocks (new documentation 2/25)  Last BM:  2/21  Height:   Ht Readings from Last 1 Encounters:  02/13/20 6' (1.829 m)    Weight:   Wt Readings from Last 1 Encounters:  02/14/20 (!) 148.2 kg    Ideal Body Weight:  80.9 kg  BMI:  Body mass index is 44.31 kg/m.  Estimated Nutritional Needs:   Kcal:  1780-2022 kcal  Protein:  >/= 202 grams  Fluid:  >/= 2 L/day     Jarome Matin,  MS, RD, LDN, CNSC Inpatient Clinical Dietitian RD pager # available in [place]  After hours/weekend pager # available in [facility]

## 2020-02-14 NOTE — Progress Notes (Signed)
Daily Progress Note   Patient Name: Shane Avila       Date: 02/14/2020 DOB: 11-14-1951  Age: 69 y.o. MRN#: [id number] Attending Physician: Tanda Rockers, MD Primary Care Physician: Bonnita Nasuti, MD Admit Date: 01/27/2020  Reason for Consultation/Follow-up: Establishing goals of care  Subjective:  Remains on the vent, wife at bedside, call placed and discussed with daughter, Shane Avila as well.  Discussed his clinical course and family desire to meet Sunday morning to discuss his condition with consideration for transition to comfort care.    Length of Stay: 14  Current Medications: Scheduled Meds:  . bisoprolol  5 mg Oral Q12H  . chlorhexidine gluconate (MEDLINE KIT)  15 mL Mouth Rinse BID  . Chlorhexidine Gluconate Cloth  6 each Topical Daily  . dexamethasone (DECADRON) injection  4 mg Intravenous Q6H  . digoxin  0.25 mg Per Tube Daily  . diltiazem  60 mg Per Tube Q6H  . docusate  50 mg Per Tube Daily  . feeding supplement (PRO-STAT SUGAR FREE 64)  60 mL Per Tube QID  . feeding supplement (VITAL HIGH PROTEIN)  1,000 mL Per Tube Q24H  . folic acid  1 mg Per Tube Daily  . free water  400 mL Per Tube Q4H  . insulin aspart  0-9 Units Subcutaneous Q4H  . insulin glargine  10 Units Subcutaneous BID  . levalbuterol  0.63 mg Nebulization Q6H  . mouth rinse  15 mL Mouth Rinse 10 times per day  . multivitamin  15 mL Per Tube Daily  . mupirocin cream   Topical Daily  . pantoprazole sodium  40 mg Per Tube Daily  . sennosides  5 mL Per Tube Daily  . sodium chloride flush  10-40 mL Intracatheter Q12H  . vitamin B-12  1,000 mcg Per Tube Daily    Continuous Infusions: . sodium chloride Stopped (02/13/20 0957)  . ceFEPime (MAXIPIME) IV Stopped (02/14/20 1756)  . dextrose 100 mL/hr at  02/14/20 1727  . fentaNYL infusion INTRAVENOUS 100 mcg/hr (02/14/20 2108)  . propofol (DIPRIVAN) infusion 15 mcg/kg/min (02/14/20 2114)  . vancomycin Stopped (02/14/20 1323)    PRN Meds: sodium chloride, acetaminophen **OR** acetaminophen, docusate, fentaNYL, metoprolol tartrate, ondansetron **OR** ondansetron (ZOFRAN) IV, sodium chloride flush  Physical Exam         Remains on  vent Sedated Abdomen appears distended Has trace generalized edema Monitor noted.   Vital Signs: BP 113/73   Pulse 84   Temp 99.8 F (37.7 C) (Oral)   Resp 17   Ht 6' (1.829 m)   Wt (!) 148.2 kg   SpO2 95%   BMI 44.31 kg/m  SpO2: SpO2: 95 % O2 Device: O2 Device: Ventilator O2 Flow Rate: O2 Flow Rate (L/min): 2 L/min  Intake/output summary:   Intake/Output Summary (Last 24 hours) at 02/14/2020 2118 Last data filed at 02/14/2020 1727 Gross per 24 hour  Intake 2891.51 ml  Output 4000 ml  Net -1108.49 ml   LBM: Last BM Date: 02/09/20 Baseline Weight: Weight: (!) 142.6 kg Most recent weight: Weight: (!) 148.2 kg       Palliative Assessment/Data:    Flowsheet Rows     Most Recent Value  Intake Tab  Referral Department  Critical care  Unit at Time of Referral  ICU  Palliative Care Primary Diagnosis  Cancer  Date Notified  02/06/20  Palliative Care Type  New Palliative care  Reason for referral  Clarify Goals of Care  Date of Admission  02/05/2020  Date first seen by Palliative Care  02/07/20  # of days Palliative referral response time  1 Day(s)  # of days IP prior to Palliative referral  6  Clinical Assessment  Psychosocial & Spiritual Assessment  Palliative Care Outcomes      Patient Active Problem List   Diagnosis Date Noted  . Pressure injury of skin 02/12/2020  . Atrial fibrillation, new onset ([place]) 02/12/2020  . Acute respiratory failure with hypoxemia ([place]) 02/10/2020  . Shock circulatory ([place]) 02/10/2020  . History of ETT   . Brain mass 02/01/2020  . Acute encephalopathy  02/01/2020  . 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 AKI (acute kidney injury) ([place]) 02/15/2020  . Primary cancer of right lower lobe of lung ([place]) 01/27/2020  . Lobar pneumonia ([place]) 01/24/2020    Palliative Care Assessment & Plan   Patient Profile:  69 yo gentleman with COPD, HTN HLD, A fib and depression. History of COVID infection in October 2020. Found to have a lung mass, brain mets, right lung occlusion. S/p bronchoscopy on 2/16 and transfer to [facility] for initiation of XRT.  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 Acute vent dependent hypoxic resp failure Suspected R lung collapse, postobstructive PNA.  MRI brain with hemorrhagic mass  Recommendations/Plan: Patient to undergo last radiation today Remains on the vent, continue current mode of care.  - Briefly reviewed clinical course and potential plan for one way extubation.  Plan for family meeting to discuss his condition and next steps on Sunday.  His wife, 2 daughters, and granddaughter to attend.  Tentative plan per family is for 1100.  See Dr. Gustavus Bryant note regarding his conversation with family as well.   Code Status:    Code Status Orders  (From admission, onward)         Start     Ordered   02/06/20 1141  Do not attempt resuscitation (DNR)  Continuous    Question Answer Comment  In the event of cardiac or respiratory ARREST Do not call a "code blue"   In the event of cardiac or respiratory ARREST Do not perform Intubation, CPR, defibrillation or ACLS   In the event of cardiac or respiratory ARREST Use medication by any route, position,  wound care, and other measures to relive pain and suffering. May use oxygen, suction and manual treatment of airway obstruction as needed for comfort.      02/06/20 1140        Code Status History    Date Active Date Inactive Code Status Order ID Comments User Context   02/10/2020  1720 02/06/2020 1140 Full Code [id number]  Elmarie Shiley, MD Inpatient   Advance Care Planning Activity      Prognosis:  guarded   Discharge Planning: To Be Determined  Care plan was discussed with  Wife, daughter Shane Avila on the phone  Thank you for allowing the Palliative Medicine Team to assist in the care of this patient.   Total Time 25 Prolonged Time Billed No       Greater than 50%  of this time was spent counseling and coordinating care related to the above assessment and plan.  [name], MD  Please contact Palliative Medicine Team phone at [phone] for questions and concerns.

## 2020-02-15 ENCOUNTER — Inpatient Hospital Stay (HOSPITAL_COMMUNITY): Payer: Medicare HMO

## 2020-02-15 LAB — GLUCOSE, CAPILLARY
Glucose-Capillary: 137 mg/dL — ABNORMAL HIGH (ref 70–99)
Glucose-Capillary: 138 mg/dL — ABNORMAL HIGH (ref 70–99)
Glucose-Capillary: 144 mg/dL — ABNORMAL HIGH (ref 70–99)
Glucose-Capillary: 146 mg/dL — ABNORMAL HIGH (ref 70–99)
Glucose-Capillary: 147 mg/dL — ABNORMAL HIGH (ref 70–99)
Glucose-Capillary: 148 mg/dL — ABNORMAL HIGH (ref 70–99)
Glucose-Capillary: 153 mg/dL — ABNORMAL HIGH (ref 70–99)

## 2020-02-15 LAB — CULTURE, RESPIRATORY W GRAM STAIN
Culture: NORMAL
Special Requests: NORMAL

## 2020-02-15 LAB — BASIC METABOLIC PANEL
Anion gap: 6 (ref 5–15)
BUN: 58 mg/dL — ABNORMAL HIGH (ref 8–23)
CO2: 26 mmol/L (ref 22–32)
Calcium: 8.5 mg/dL — ABNORMAL LOW (ref 8.9–10.3)
Chloride: 113 mmol/L — ABNORMAL HIGH (ref 98–111)
Creatinine, Ser: 0.7 mg/dL (ref 0.61–1.24)
GFR calc Af Amer: >60 mL/min (ref >60)
GFR calc non Af Amer: >60 mL/min (ref >60)
Glucose, Bld: 148 mg/dL — ABNORMAL HIGH (ref 70–99)
Potassium: 5.1 mmol/L (ref 3.5–5.1)
Sodium: 145 mmol/L (ref 135–145)

## 2020-02-15 LAB — CBC
HCT: 36.8 % — ABNORMAL LOW (ref 39.0–52.0)
Hemoglobin: 10.2 g/dL — ABNORMAL LOW (ref 13.0–17.0)
MCH: 25.8 pg — ABNORMAL LOW (ref 26.0–34.0)
MCHC: 27.7 g/dL — ABNORMAL LOW (ref 30.0–36.0)
MCV: 92.9 fL (ref 80.0–100.0)
Platelets: 157 10*3/uL (ref 150–400)
RBC: 3.96 MIL/uL — ABNORMAL LOW (ref 4.22–5.81)
RDW: 17.5 % — ABNORMAL HIGH (ref 11.5–15.5)
WBC: 14.2 10*3/uL — ABNORMAL HIGH (ref 4.0–10.5)
nRBC: 0 % (ref 0.0–0.2)

## 2020-02-15 LAB — TRIGLYCERIDES: Triglycerides: 78 mg/dL (ref <150)

## 2020-02-15 IMAGING — DX DG ABDOMEN 1V
1 series · 1 of 1 positions shown · non-contrast
Comparison: [DATE]

CLINICAL DATA: Feeding tube placement.

EXAM:
ABDOMEN - 1 VIEW

[abdomen kub]
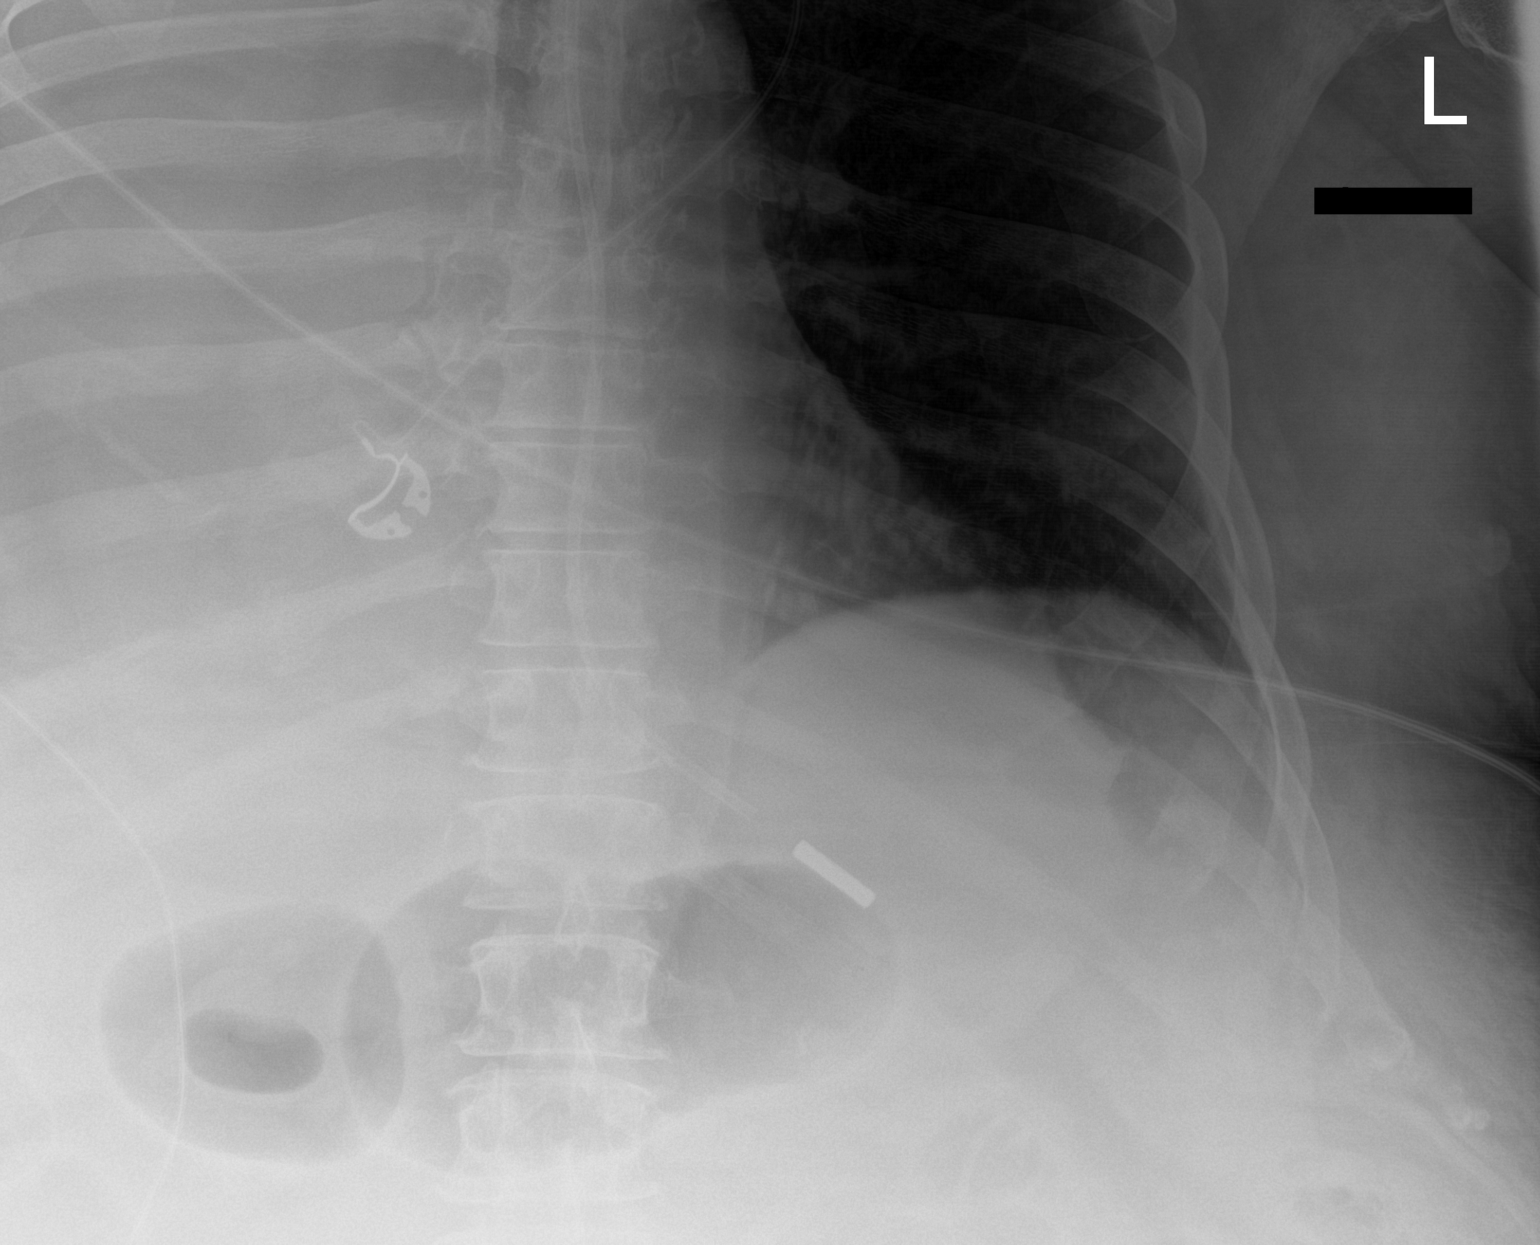

[1 of 1 positions shown; findings below may reference images not displayed]

FINDINGS: A small bore feeding tube is identified with tip overlying the
proximal stomach. Advancement recommended.

Opacified RIGHT hemithorax again noted.
IMPRESSION: Small bore feeding tube with tip overlying the proximal stomach.
Recommend advancement.

## 2020-02-15 IMAGING — DX DG CHEST 1V PORT
1 series · 1 of 1 positions shown · non-contrast
Comparison: [DATE]

CLINICAL DATA: Central line placement

EXAM:
PORTABLE CHEST 1 VIEW

[chest ap]
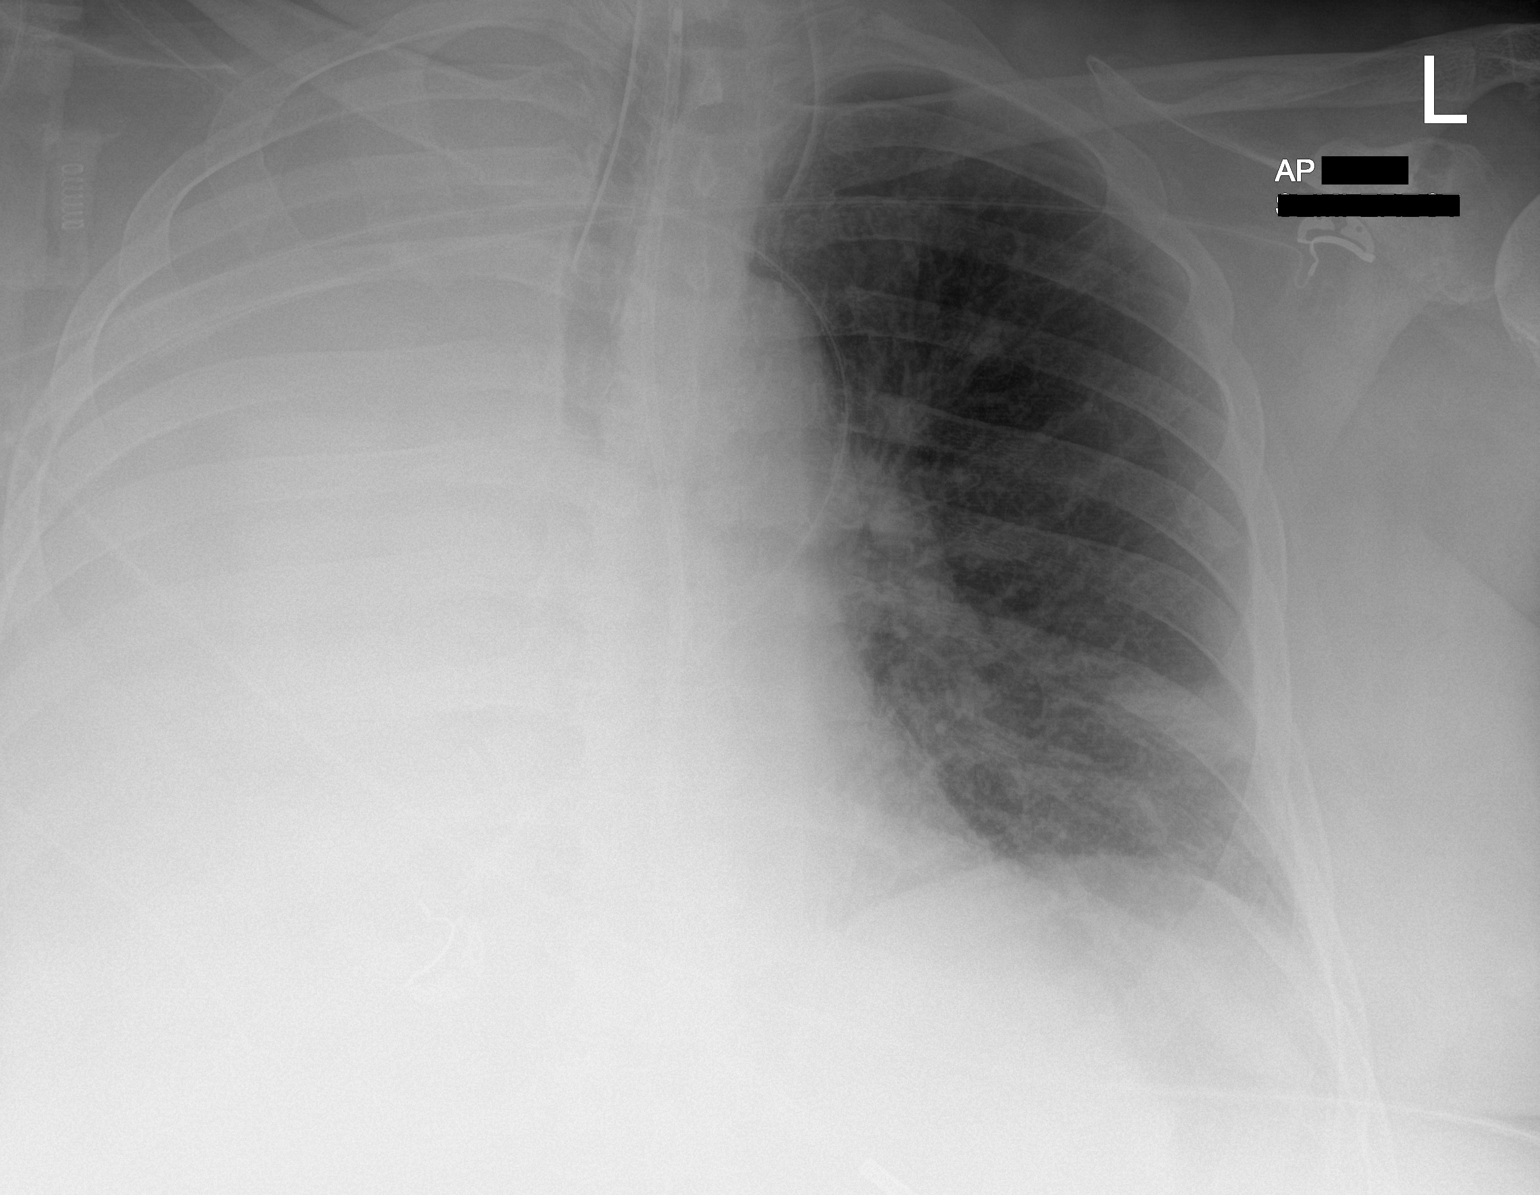

[1 of 1 positions shown; findings below may reference images not displayed]

FINDINGS: Endotracheal tube in good position. Feeding tube in place with the
tip not visualized.

Left jugular central venous catheter tip in the left innominate vein
unchanged.

Complete opacification of the right chest without aerated lung. No
mediastinal shift. Mild left lower lobe atelectasis.
IMPRESSION: No interval change.  Complete opacification right lung.

## 2020-02-15 MED ORDER — FUROSEMIDE 10 MG/ML IJ SOLN
40.0000 mg | Freq: Once | INTRAMUSCULAR | Status: AC
  Administered 2020-02-15: 40 mg via INTRAVENOUS
  Filled 2020-02-15: qty 4

## 2020-02-15 MED ORDER — DILTIAZEM HCL 60 MG PO TABS
90.0000 mg | ORAL_TABLET | Freq: Four times a day (QID) | ORAL | Status: DC
  Administered 2020-02-15 – 2020-02-16 (×5): 90 mg
  Filled 2020-02-15 (×5): qty 1

## 2020-02-15 NOTE — Progress Notes (Signed)
eLink Physician-Brief Progress Note Patient Name: Shane Avila DOB: [birth year] MRN: [id number]   Date of Service  02/15/2020  HPI/Events of Note  Recently placed central line is non-functional  eICU Interventions  CXR to verify line is in the right place.        Shane Avila 02/15/2020, 6:21 AM

## 2020-02-15 NOTE — Progress Notes (Signed)
Dr. Melvyn Novas paged and made aware of the patient's heart rate now maintaining Afib in the 120-130's with frequent bursts up to the 150-160's. PO cardizem dose had been increased this AM. No new orders received at this time. Continuing to monitor

## 2020-02-15 NOTE — Progress Notes (Signed)
NAME:  Shane Avila, MRN:  [id number], DOB:  1951-11-09, LOS: 46 ADMISSION DATE:  02/05/2020, CONSULTATION DATE: 02/01/2020 REFERRING MD: Dr. Tyrell Antonio, CHIEF COMPLAINT: Subcarinal mass  Brief History   69 year old former smoker (110 pack years) with a history of COPD and chronic hypoxemic respiratory failure on chronic O2 at 2 L/min.  Also with hypertension, hyperlipidemia, atrial fibrillation and depression.  He  wasundergoing work-up for a subcarinal, right hilar mass that was first discovered on CT chest 11/13/2019 at [place].   He was evaluated in the ED at [facility] 2/11 for confusion and "bizarre behavior".  CT head revealed a left frontal small hyperdense area, question SAH versus mass.  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  His other work-up revealed acute renal failure (improving).   Underwent Bronchoscopy 2/16 for obstructed right mainstem with Dr. Valeta Harms, with evidence of fully obstructed right lung from tumor originating from the medial portion of the main carina. During procedure patient developed Rapid A-fib with RVR.   If the patient was to have significant amount of bleeding from the endotracheal tube the endotracheal tube should be advanced into the left mainstem for unilateral left-sided lung ventilation.  Do NOT ADVANCE the endotracheal tube into the right mainstem  NO SUCTIONING on the mechanical ventilator into the airway  Past Medical History  COPD Chronic hypoxemic respiratory failure Hypertension Hyperlipidemia Atrial fibrillation Depression COVID-19 pneumonia in October 2020, did not require hospital admission  [place] [facility] Events   2/12 Admit  2/16 Bronch with diagnosis of Sq cell cancer, tx to [facility] for XRT 2/19 ETT tube exchange due to blown cuff 2/20 Dex d/cd  2/22 started on stress HC for low bp> changed back to dex per Tammi Klippel  Rec  2/24    Consults:  Neurology PCCM Radiation oncology Tammi Klippel)   Procedures:  Bronchoscopy 2/16, 2/19 ETT 2/16 >> Lt IJ CVL 2/16 >> Lt radial A aline 2/16 > 2/19 RT to Lung 2/17 and 18th held on 19th due to unstable rhythm  RT resumed 2/22 - last rx 2/26  D/c cardizem drip 2/5   Significant Diagnostic Tests:  CT chest [place]) 01/30/2020 >> 5.1 x 4.8 cm subcarinal mass that impacts the main carina and extrinsically compresses the right mainstem bronchus, right upper lobe airway and bronchus intermedius.  Unclear whether there is an endobronchial lesion.  Significant right lower lobe volume loss/atelectasis/collapse vs post-obstructive PNA.  Small right effusion MRI Brain w/o 2/12 >> approximate 17m cortically based hemorrhagic mass inovlving the anterior left frontal lobe, mild vasogenic edema w/o significant regional mass effect MRI Brain w/o 2/13 >> motion degraded exam, unchanged 14 mm cortically based hemorrhagic mass  EEG 2/13  wnl  Micro Data:  SARSCov2 2/12 >> negative Blood 2/12 >> negative  Respiratory 2/12 >> never sent  MRSA PCR 2/17 >>> neg BC x 2 2/25 >>> UC  2/25  Neg  Trach asp 2/25 >> few wbc's no org seen >>>  Antimicrobials:  Ceftriaxone 2/12 x1 Azithromycin 2/12 > 2/20  Zosyn 2/13 >  2/22 Maxepime 2/25 >>> Vanc  2/25 >>>   Scheduled Meds: . bisoprolol  5 mg Oral Q12H  . chlorhexidine gluconate (MEDLINE KIT)  15 mL Mouth Rinse BID  . Chlorhexidine Gluconate Cloth  6 each Topical Daily  . dexamethasone (DECADRON) injection  4 mg Intravenous Q6H  . digoxin  0.25 mg Per Tube Daily  .  diltiazem  60 mg Per Tube Q6H  . docusate  50 mg Per Tube Daily  . feeding supplement (PRO-STAT SUGAR FREE 64)  60 mL Per Tube QID  . feeding supplement (VITAL HIGH PROTEIN)  1,000 mL Per Tube Q24H  . folic acid  1 mg Per Tube Daily  . free water  400 mL Per Tube Q4H  . insulin aspart  0-9 Units Subcutaneous Q4H  . insulin glargine  10 Units Subcutaneous BID  .  levalbuterol  0.63 mg Nebulization Q6H  . mouth rinse  15 mL Mouth Rinse 10 times per day  . multivitamin  15 mL Per Tube Daily  . mupirocin cream   Topical Daily  . pantoprazole sodium  40 mg Per Tube Daily  . sennosides  5 mL Per Tube Daily  . sodium chloride flush  10-40 mL Intracatheter Q12H  . vitamin B-12  1,000 mcg Per Tube Daily   Continuous Infusions: . sodium chloride Stopped (02/13/20 0957)  . ceFEPime (MAXIPIME) IV Stopped (02/15/20 0132)  . dextrose 100 mL/hr at 02/15/20 0900  . fentaNYL infusion INTRAVENOUS 100 mcg/hr (02/15/20 0900)  . propofol (DIPRIVAN) infusion 15 mcg/kg/min (02/15/20 0900)  . vancomycin Stopped (02/14/20 2316)   PRN Meds:.sodium chloride, acetaminophen **OR** acetaminophen, docusate, fentaNYL, metoprolol tartrate, ondansetron **OR** ondansetron (ZOFRAN) IV, sodium chloride flush   Interim history/subjective:  Spiked again this am/ remains sedated on vent/ rapid afib with fever   Objective   Blood pressure 140/76, pulse (!) 39, temperature (!) 101 F (38.3 C), temperature source Oral, resp. rate (!) 24, height 6' (1.829 m), weight (!) 150 kg, SpO2 97 %. CVP:  [14 mmHg-18 mmHg] 14 mmHg  Vent Mode: PRVC FiO2 (%):  [40 %] 40 % Set Rate:  [12 bmp] 12 bmp Vt Set:  [409 mL] 620 mL PEEP:  [5 cmH20] 5 cmH20 Plateau Pressure:  [24 cmH20-34 cmH20] 25 cmH20   Intake/Output Summary (Last 24 hours) at 02/15/2020 0919 Last data filed at 02/15/2020 0900 Gross per 24 hour  Intake 3594.38 ml  Output 5525 ml  Net -1930.62 ml   Filed Weights   02/13/20 0413 02/14/20 0452 02/15/20 0408  Weight: (!) 149.1 kg (!) 148.2 kg (!) 150 kg      Examination: Pt sedated on vent / hob 30 degrees  No jvd Oropharynx et/NG  in place Neck supple Lungs with junky ronchi bilaterally/ absent bs on R  RRR no s3 or or sign murmur Abd obese poor  excursion  Extr warm with trace edema or clubbing noted    pCXR 2/27 : no aeration on R         Resolved Hospital  Problem list   Acute renal failure   Assessment & Plan:   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 -case reviewed with Neurosurgery, given no contrasted images and inability to determine actual size of mass vs contribution of hemorrhage. Dr. Nickolas Madrid ([place]) made aware of case as well to follow peripherally / for Tumor Board review, appreciate assistance. -underwent video bronch 2/16  -Remained intubated post bronchoscopy due to significant tumor presence  Plan: Minimal  endotracheal suctioning - ok to suction the et to just beyond the level of the end (reviewed with RT) If bleeding occurs advance ETT into left mainstem NOT RIGHT RT restarted 2/22 pm and q weekday thereafter/ discussed logistics with Dr Tammi Klippel who feels the pt should be back on dex due to head met  not planning on CNS Radiation for now -   stopped RT 2/26 and giving  him the weekend off, have fm visit today and tomorrow, and if not improving consider shift to comfort care and terminally wean 2/28 or 3/1  (fm seeking more info from palliative care as to how this is going to work)    Acute respiratory failure with hypoxema/ vent dep due to obst RMSB/MO? /copd  Wean PEEP and FiO2 for sats greater than 90%.per ards protocol  Head of bed elevated 30 degrees. Plateau pressures less than 30 cm H20 > achieved  Ensure adequate pulmonary hygiene  VAP bundle in place  PAD protocol Continue nebs     Suspected postobstructive pneumonia with right lung collapse  Plan:  d/c'd  zosyn 2/22 p completing 10 days rx   >>> rx RT continue, no evidence of improved aeration of R lung but explained to fm 2/22 way to soon to "see if we'll win this battle... winning the war is another issue and much tougher fight" ie the big picture is not looking good but short term goal may still be achievable > get him off vent  - new fever 2/25 so repeat cultures/ started vanc/maxepime per flow sheet  > new LL as dz on CT chest  Done for RT planning on 2/26 noted but covered for HCAP already > no change     Anemia Lab Results  Component Value Date   HGB 10.2 (L) 02/15/2020   HGB 10.1 (L) 02/14/2020   HGB  02/14/2020    QUESTIONABLE RESULTS, RECOMMEND RECOLLECT TO VERIFY    Plan Trend CBC Monitor for bleeding  Transfuse for HGB < 7 D/c'd lovenox 2/22 due to risk of recurrent bleeding airway/ brain met   COPD Plan: Changed brovana to xopenex qid 2/22 due to RAF D/c 'd pulmicort as already on Dex and risk iritating airway with high dose ICS       Circulatory shock  - CVP:  [14 mmHg-18 mmHg] 14 mmHg Remains off neo       New onset Atrial Fibrillation  -Developed during bronch  P:  Cardizem IV drip stopped 2/25 so just on cardizem per ft/ bisoprolol 5 mg bid   Added digoxin 2/23 > improved rate control, continue daily rx per pharmacy dosing  >>>  change to bisoprolol for most selective BB available am 2/26  >> > increased cardizem 2/27       Acute metabolic Encephalopathy   -Suspect largely due to his left frontal lobe lesion.  Plan:  Decadron stopped 2/20 so added [facility] 2/22 > changed back to dex 2/24 at RT([place]) rec  Stopped  kepra 2/23 with  EEG nl    Goals of care Plan: Currently DNR Palliative care following  ? Wean off vent 2/28 pm or 3/1 am if not making progress     Nutrition Plan: Continue tube feeds/ added lantus 2/26 .   Hypernatremia acute new since admit - rx free water 2/24 but Na up further am 2/26 so rx increase free water and d5w 2/26  - improving 2/27 > no change rx   Best practice:  Diet: N.p.o.,  tube feed Pain/Anxiety/Delirium protocol (if indicated): Propofol, fentanyl VAP protocol (if indicated): Ordered DVT prophylaxis: SCDs only for now given bleeding risk brain and R MSB  GI prophylaxis: Protonix Glucose control:Monitor Mobility: Bed Code Status: Full Family Communication:  Discussed directly with pt's wife 2/26 plan for  weekend Disposition: ICU  LABS  Glucose Recent Labs  Lab 02/14/20 1133 02/14/20 1720 02/14/20 2023 02/14/20 2337 02/15/20 0405 02/15/20 0823  GLUCAP 153* 157* 135* 148* 137* 144*    BMET Recent Labs  Lab 02/14/20 0445 02/14/20 0722 02/15/20 0548  NA QUESTIONABLE RESULTS, RECOMMEND RECOLLECT TO VERIFY 151* 145  K QUESTIONABLE RESULTS, RECOMMEND RECOLLECT TO VERIFY 5.3* 5.1  CL QUESTIONABLE RESULTS, RECOMMEND RECOLLECT TO VERIFY 118* 113*  CO2 QUESTIONABLE RESULTS, RECOMMEND RECOLLECT TO VERIFY 27 26  BUN QUESTIONABLE RESULTS, RECOMMEND RECOLLECT TO VERIFY 57* 58*  CREATININE QUESTIONABLE RESULTS, RECOMMEND RECOLLECT TO VERIFY 0.66 0.70  GLUCOSE QUESTIONABLE RESULTS, RECOMMEND RECOLLECT TO VERIFY 194* 148*    Liver Enzymes No results for input(s): AST, ALT, ALKPHOS, BILITOT, ALBUMIN in the last 168 hours.  Electrolytes Recent Labs  Lab 02/09/20 0551 02/09/20 0551 02/09/20 1700 02/10/20 0528 02/12/20 0300 02/13/20 0415 02/13/20 0415 02/14/20 0445 02/14/20 0722 02/15/20 0548  CALCIUM 8.7*   < >  --  8.8*   < > 7.9*   < > QUESTIONABLE RESULTS, RECOMMEND RECOLLECT TO VERIFY 8.8* 8.5*  MG 2.8*   < > 2.6* 2.4  --  2.3  --   --   --   --   PHOS 3.5  --  3.9 2.7  --   --   --   --   --   --    < > = values in this interval not displayed.    CBC Recent Labs  Lab 02/14/20 0445 02/14/20 0722 02/15/20 0700  WBC QUESTIONABLE RESULTS, RECOMMEND RECOLLECT TO VERIFY 13.8* 14.2*  HGB QUESTIONABLE RESULTS, RECOMMEND RECOLLECT TO VERIFY 10.1* 10.2*  HCT QUESTIONABLE RESULTS, RECOMMEND RECOLLECT TO VERIFY 36.5* 36.8*  PLT QUESTIONABLE RESULTS, RECOMMEND RECOLLECT TO VERIFY 160 157    ABG No results for input(s): PHART, PCO2ART, PO2ART in the last 168 hours.  Coag's No results for input(s): APTT, INR in the last 168 hours.  Sepsis Markers No results for input(s): LATICACIDVEN, PROCALCITON, O2SATVEN in the last 168 hours.  Cardiac Enzymes No results for  input(s): TROPONINI, PROBNP in the last 168 hours.      Christinia Gully, MD Pulmonary and [place] [facility] [place] [phone] After 5:30 PM or weekends, use [facility] [phone]

## 2020-02-16 ENCOUNTER — Inpatient Hospital Stay (HOSPITAL_COMMUNITY): Payer: Medicare HMO

## 2020-02-16 LAB — GLUCOSE, CAPILLARY
Glucose-Capillary: 160 mg/dL — ABNORMAL HIGH (ref 70–99)
Glucose-Capillary: 165 mg/dL — ABNORMAL HIGH (ref 70–99)
Glucose-Capillary: 156 mg/dL — ABNORMAL HIGH (ref 70–99)

## 2020-02-16 LAB — TRIGLYCERIDES: Triglycerides: 133 mg/dL (ref <150)

## 2020-02-16 IMAGING — DX DG CHEST 1V PORT
2 series · 2 of 2 positions shown · non-contrast
Comparison: [DATE]

CLINICAL DATA: Pleural effusion

EXAM:
PORTABLE CHEST 1 VIEW

[chest ap (1 of 2)]
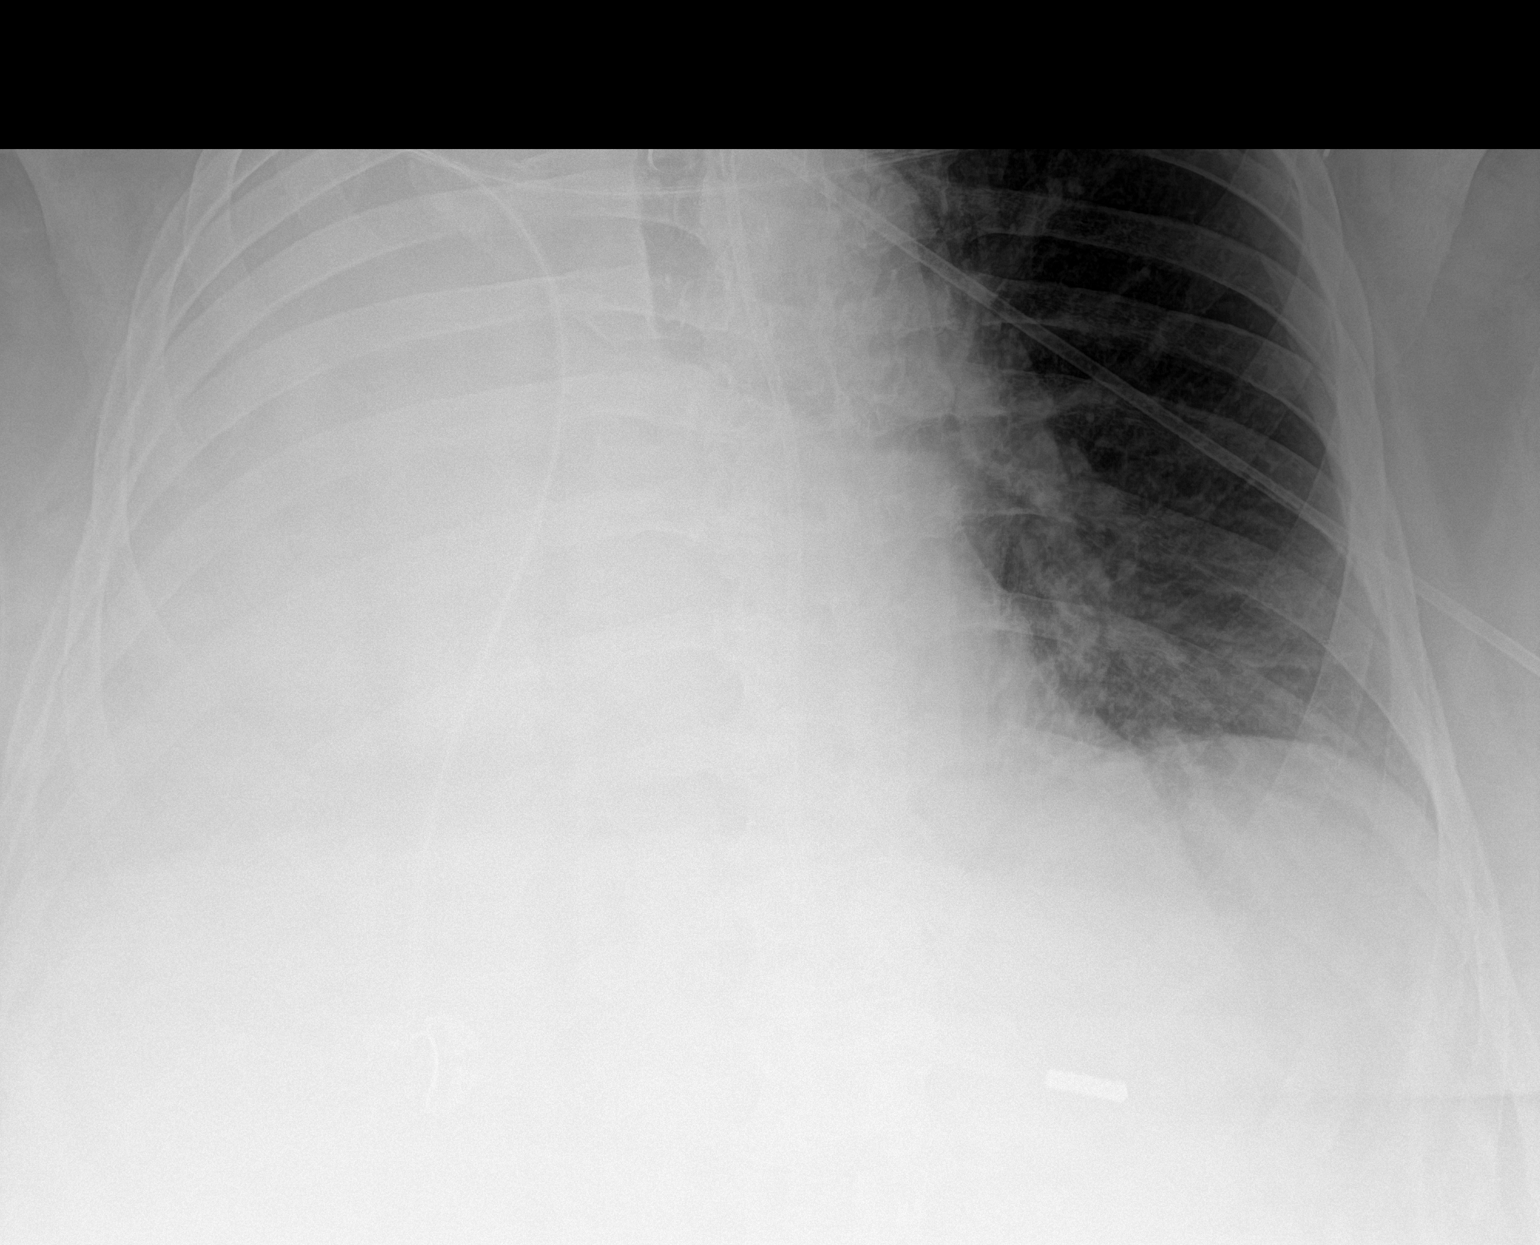

[chest ap (2 of 2)]
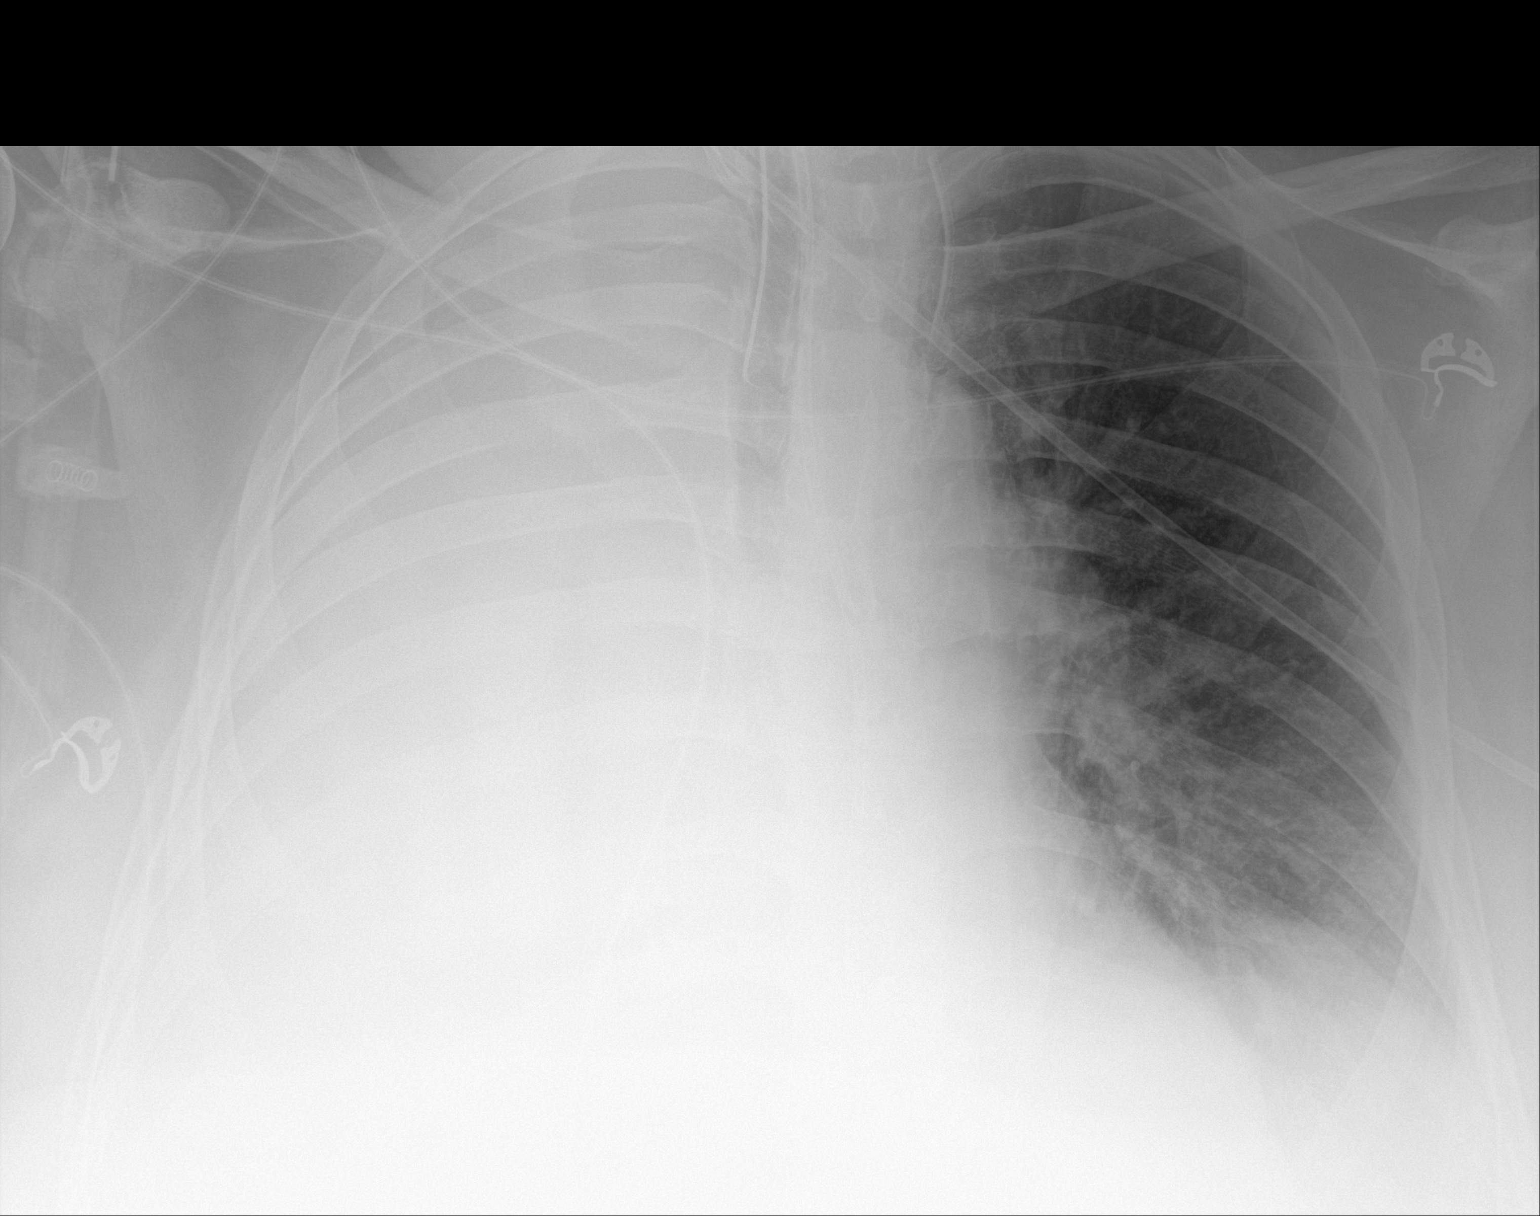

[2 of 2 positions shown; findings below may reference images not displayed]

FINDINGS: Unchanged AP portable radiograph with total opacification of the
right hemithorax, and a small, layering left pleural effusion.
Unchanged support apparatus. Heart and mediastinum are predominantly
obscured.
IMPRESSION: Unchanged AP portable radiograph with total opacification of the
right hemithorax and a small, layering left pleural effusion.
Unchanged support apparatus.

## 2020-02-16 MED ORDER — GLYCOPYRROLATE 0.2 MG/ML IJ SOLN
0.4000 mg | INTRAMUSCULAR | Status: DC | PRN
  Administered 2020-02-16: 14:00:00 0.4 mg via INTRAVENOUS
  Filled 2020-02-16 (×2): qty 2

## 2020-02-16 MED ORDER — FUROSEMIDE 10 MG/ML IJ SOLN
40.0000 mg | Freq: Once | INTRAMUSCULAR | Status: AC
  Administered 2020-02-16: 40 mg via INTRAVENOUS
  Filled 2020-02-16: qty 4

## 2020-02-16 MED ORDER — FREE WATER
400.0000 mL | Freq: Four times a day (QID) | Status: DC
  Administered 2020-02-16: 400 mL

## 2020-02-16 MED ORDER — POLYVINYL ALCOHOL 1.4 % OP SOLN
1.0000 [drp] | Freq: Four times a day (QID) | OPHTHALMIC | Status: DC | PRN
  Filled 2020-02-16: qty 15

## 2020-02-16 MED ORDER — DEXTROSE 5 % IV SOLN: INTRAVENOUS | Status: DC

## 2020-02-16 MED ORDER — LORAZEPAM 2 MG/ML IJ SOLN: 1.0000 mg | INTRAMUSCULAR | Status: DC | PRN

## 2020-02-16 MED ORDER — DIPHENHYDRAMINE HCL 50 MG/ML IJ SOLN: 25.0000 mg | INTRAMUSCULAR | Status: DC | PRN

## 2020-02-16 MED ORDER — MORPHINE SULFATE (PF) 4 MG/ML IV SOLN: 4.0000 mg | INTRAVENOUS | Status: DC | PRN

## 2020-02-17 ENCOUNTER — Encounter: Payer: Self-pay | Admitting: Radiation Oncology

## 2020-02-17 ENCOUNTER — Ambulatory Visit: Payer: Medicare HMO

## 2020-02-17 NOTE — Progress Notes (Signed)
Pharmacy Antibiotic Note  Shane Avila is a 69 y.o. male with lung mass and brain mets currently undergoing XRT. He is known to pharmacy from abx consults with this admission. He completed ten days of azithromycin and zosyn on 2/22 for PNA. Pharmacy was consulted on to start vancomycin and cefepime for suspected sepsis.  Today, 2020-02-19: - Afebrile -  scr 0.63 (crcl~100) - WBC remains elevated- 14.8  Plan: - Continue cefepime 2 gm q8h - Continue Vancomycin 1500 mg q12h for goal AUC 400-550 - monitor renal function - F/U plans after family discussion, planned abx LOT ____________________________________  Height: 6' (182.9 cm) Weight: (!) 333 lb 12.4 oz (151.4 kg) IBW/kg (Calculated) : 77.6  Temp (24hrs), Avg:99 F (37.2 C), Min:97.8 F (36.6 C), Max:99.9 F (37.7 C)  Recent Labs  Lab 02/12/20 0300 02/13/20 0415 02/14/20 0445 02/14/20 0722 02/15/20 0548 02/15/20 0700  WBC 11.9* 10.6* QUESTIONABLE RESULTS, RECOMMEND RECOLLECT TO VERIFY 13.8*  --  14.2*  CREATININE 0.71 0.63 QUESTIONABLE RESULTS, RECOMMEND RECOLLECT TO VERIFY 0.66 0.70  --     Estimated Creatinine Clearance: 137.6 mL/min (by C-G formula based on SCr of 0.7 mg/dL).    Allergies  Allergen Reactions  . Codeine Nausea Only  . Xanax [Alprazolam] Other (See Comments)    Patient stated "I don't like the way this makes me feel." He couldn't hold up his head up for 3 days after taking it and made him "ill." patient can tolerate low-dose Clonazepam.    Antimicrobials this admission: 2/12 rocephin x1 2/12 azithromycin>> 2/20 2/13 zosyn >> 2/22 2/25 vanc>> 2/25 cefepime>>     Microbiology results: 2/12 blood- ngF 2/17 MRSA PCR neg 2/13 HIV NR 2/12 Covid neg  Thank you for allowing pharmacy to be a part of this patient's care.  Netta Cedars PharmD, BCPS 02-19-2020 1:56 PM

## 2020-02-17 NOTE — Progress Notes (Signed)
Daily Progress Note   Patient Name: Shane Avila       Date: 02-27-2020 DOB: [birth year]  Age: 69 y.o. MRN#: [id number] Attending Physician: Tanda Rockers, MD Primary Care Physician: Bonnita Nasuti, MD Admit Date: 02/06/2020  Reason for Consultation/Follow-up: Establishing goals of care  Subjective: Remains on the vent.  I met today with family including wife, 2 daughters, and granddaughter.  I introduced palliative care as specialized medical care for people living with serious illness. It focuses on providing relief from the symptoms and stress of a serious illness. The goal is to improve quality of life for both the patient and the family.  Values and goals of care important to patient and family were attempted to be elicited.  Family reports that the most important things to him have always been his family and his faith.  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  We discussed clinical course as well as wishes moving forward in regard to his care plan this hospitalization.  We reviewed his diagnosis of cancer, finding of brain mets, finding of respiratory lesion, and subsequent bronchoscopy with prolonged ventilation.  Discussed that hope at the onset of agreeing to radiation was that the tumor would shrink enough for him to be liberated from ventilator.  We reviewed the most recent CT picture and note from Dr. Tammi Klippel and the unknown about if this is something that will improve with time and more therapy.  Family expressed that they do not think that he would want to continue with current course and would not have wanted to be intubated for any period of time.   We discussed difference between a aggressive medical intervention path and a palliative, comfort  focused care path.  Discussed option to continue with current interventions including continuation of ventilation and possibility of more radiation therapy versus transitioning to comfort care and liberating from ventilator with understanding he will likely die within a very short period of time following extubation.   Family believes that his decision if he were able to understand his situation would be to withdraw life support and focus on comfort.  Questions and concerns addressed.    Length of Stay: 16  Current Medications: Scheduled Meds:  . bisoprolol  5 mg Oral Q12H  . chlorhexidine gluconate (MEDLINE KIT)  15 mL Mouth Rinse BID  . Chlorhexidine Gluconate Cloth  6 each Topical Daily  . dexamethasone (DECADRON) injection  4 mg Intravenous Q6H  . digoxin  0.25 mg Per Tube Daily  . diltiazem  90 mg Per Tube Q6H  . docusate  50 mg Per Tube Daily  . feeding supplement (PRO-STAT SUGAR FREE 64)  60 mL Per Tube QID  . feeding supplement (VITAL HIGH PROTEIN)  1,000 mL Per Tube Q24H  . folic acid  1 mg Per Tube Daily  . free water  400 mL Per Tube Q6H  . insulin aspart  0-9 Units Subcutaneous Q4H  . insulin glargine  10 Units Subcutaneous BID  . mouth rinse  15 mL Mouth Rinse 10 times per day  . multivitamin  15 mL Per Tube Daily  . mupirocin cream   Topical Daily  . pantoprazole sodium  40 mg Per Tube Daily  . sennosides  5 mL Per Tube Daily  . sodium chloride flush  10-40 mL Intracatheter Q12H  . vitamin B-12  1,000 mcg Per Tube Daily    Continuous Infusions: . sodium chloride Stopped (02/13/20 0957)  . ceFEPime (MAXIPIME) IV Stopped (03-09-20 0951)  . dextrose 10 mL/hr at 03-09-2020 1304  . dextrose 20 mL/hr at 03/09/20 1324  . fentaNYL infusion INTRAVENOUS 100 mcg/hr (2020/03/09 1304)  . propofol (DIPRIVAN) infusion 20 mcg/kg/min (03/09/20 1304)  . vancomycin Stopped (2020/03/09 1224)    PRN Meds: sodium chloride, acetaminophen **OR** acetaminophen, diphenhydrAMINE, docusate,  fentaNYL, glycopyrrolate, LORazepam, metoprolol tartrate, morphine injection, ondansetron **OR** ondansetron (ZOFRAN) IV, polyvinyl alcohol, sodium chloride flush  Physical Exam         Remains on vent Sedated Abdomen appears distended Has trace generalized edema Monitor noted.   Vital Signs: BP 102/71   Pulse 96   Temp 98.6 F (37 C) (Oral)   Resp (!) 21   Ht 6' (1.829 m)   Wt (!) 151.4 kg   SpO2 94%   BMI 45.27 kg/m  SpO2: SpO2: 94 % O2 Device: O2 Device: Room Air O2 Flow Rate: O2 Flow Rate (L/min): 2 L/min  Intake/output summary:   Intake/Output Summary (Last 24 hours) at 2020/03/09 1602 Last data filed at 03-09-2020 1304 Gross per 24 hour  Intake 7019.66 ml  Output 3525 ml  Net 3494.66 ml   LBM: Last BM Date: 2020/03/09 Baseline Weight: Weight: (!) 142.6 kg Most recent weight: Weight: (!) 151.4 kg       Palliative Assessment/Data:    Flowsheet Rows     Most Recent Value  Intake Tab  Referral Department  Critical care  Unit at Time of Referral  ICU  Palliative Care Primary Diagnosis  Cancer  Date Notified  02/06/20  Palliative Care Type  New Palliative care  Reason for referral  Clarify Goals of Care  Date of Admission  02/03/2020  Date first seen by Palliative Care  02/07/20  # of days Palliative referral response time  1 Day(s)  # of days IP prior to Palliative referral  6  Clinical Assessment  Psychosocial & Spiritual Assessment  Palliative Care Outcomes      Patient Active Problem List   Diagnosis Date Noted  . Pressure injury of skin 02/12/2020  . Atrial fibrillation, new onset ([place]) 02/12/2020  . Acute respiratory failure with hypoxemia ([place]) 02/10/2020  . Shock circulatory ([place]) 02/10/2020  . History of ETT   . Brain mass 02/01/2020  . Acute encephalopathy 02/01/2020  . 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 AKI (acute kidney injury) ([place]) 01/22/2020  . Primary cancer of right lower lobe of lung ([place]) 02/01/2020  .  Lobar pneumonia ([place]) 02/05/2020    Palliative Care Assessment & Plan   Patient Profile:  69 yo gentleman with COPD, HTN HLD, A fib and depression. History of COVID infection in October 2020. Found to have a lung mass, brain mets, right lung occlusion. S/p bronchoscopy on 2/16 and transfer to [facility] for initiation of XRT.  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 Acute vent dependent hypoxic resp failure Suspected R lung collapse, postobstructive PNA.  MRI brain with hemorrhagic mass  Recommendations/Plan: -Met with family today to discuss options of care moving forward.  Plan will be for one-way extubation later today. -Discussed with Dr. Melvyn Novas and bedside staff.     Code Status Orders  (From admission, onward)         Start     Ordered   02/06/20 1141  Do not attempt resuscitation (DNR)  Continuous    Question Answer Comment  In the event of cardiac or respiratory ARREST Do not call a "code blue"   In the event of cardiac or respiratory ARREST Do not perform Intubation, CPR, defibrillation or ACLS   In the event of cardiac or respiratory ARREST Use medication by any route, position, wound care, and other measures to relive pain and suffering. May use oxygen, suction and manual treatment of airway obstruction as needed for comfort.      02/06/20 1140        Code Status History    Date Active Date Inactive Code Status Order ID Comments User Context   02/14/2020 1720 02/06/2020 1140 Full Code [id number]  Elmarie Shiley, MD Inpatient   Advance Care Planning Activity      Prognosis:  poor   Discharge Planning: Anticipate hospital death  Care plan was discussed with  Wife, daughters and granddaughter  Thank you for allowing the Palliative Medicine Team to assist in the care of this patient.   Total Time 50 Prolonged Time Billed No       Greater than 50%  of this time was spent counseling and  coordinating care related to the above assessment and plan.  [name], MD  Please contact Palliative Medicine Team phone at [phone] for questions and concerns.

## 2020-02-17 NOTE — Progress Notes (Signed)
Name: Shane Avila  Location: [facility]  Petra Kuba of call: End of life/ Bereavement  Summary: Chaplain provided silent-supportive care, and prayer  Pastoral Interventions: Prayer at bedside with the Pt's Doctor, Nurse, and family; done according to the faith tradition of the family.

## 2020-02-17 NOTE — Progress Notes (Signed)
  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  The central chest was initially treated to 6 Gy in one fraction to initiate a brisk response 2.  The central chest was continued with two more fractions of 4 Gy to try to get tumor regression 3.  Following these larger fraction sizes, the central chest was continued with 5 more fractions of conventional 2 Gy per fraction to a total dose of 24 Gy  Beams/energy:      1.  The central chest was initially treated a 3D five field arrangements with static gantry angles zero, 180, 40, 164 and a dynamic arc 2.  The central chest was continued with a 3D five field arrangements with static gantry angles 312, 180, 42 and 222  3.  The central chest was continued with a 3D five field arrangements with static gantry angles 312, 180, 42 and 222   Narrative: The patient tolerated radiation treatment relatively well.   However, his right mainstem bronchus remained totally occluded throughout treatment with complete opacification of the right lung.  He suffered multiple organ failure.  Ultimately, a decision was made by the patient's family and care team to transition from active treatment to comfort care.  He was extubated for comfort care on 2/28 and expired peacefully with family at the bedside.  ________________________________  Sheral Apley Tammi Klippel, M.D.

## 2020-02-17 NOTE — Progress Notes (Signed)
Palliative Care Progress note  Present at the bedside for extubation and for comfort care following extubation.  Following extubation, Shane Avila displayed limited respiratory drive.  He appeared comfortable but did have audible secretions.  Attempts made to reposition and use drying agents (robinul).    Family present throughout and provided supportive presence.  Respirations spaced out and ceased.  On exam: Pupils fixed and dilated No palpable central pulses No withdrawal to stimuli No auscultated respiratory or cardiac activity for greater than one minute. Cardiac monitoring reveals asystole.  Time of death: [place], [place] Team [phone]

## 2020-02-17 NOTE — Procedures (Signed)
Extubation Procedure Note  Patient Details:   Name: Kollin Udell DOB: 04-Dec-1951 MRN: [id number]   Airway Documentation:    Vent end date: March 07, 2020 Vent end time: 1445   Evaluation  O2 sats: currently acceptable Complications: No apparent complications Patient did tolerate procedure well. Bilateral Breath Sounds: Coarse crackles   No   Patient extubated to comfort care. Family at bedside with patient.   Lamonte Sakai 2020/03/07, 2:47 PM

## 2020-02-17 NOTE — Progress Notes (Addendum)
NAME:  Shane Avila, MRN:  [id number], DOB:  03-04-1951, LOS: 58 ADMISSION DATE:  01/28/2020, CONSULTATION DATE: 02/01/2020 REFERRING MD: Dr. Tyrell Antonio, CHIEF COMPLAINT: Subcarinal mass  Brief History   69 year old former smoker (110 pack years) with a history of COPD and chronic hypoxemic respiratory failure on chronic O2 at 2 L/min.  Also with hypertension, hyperlipidemia, atrial fibrillation and depression.  He  wasundergoing work-up for a subcarinal, right hilar mass that was first discovered on CT chest 11/13/2019 at [place].   He was evaluated in the ED at [facility] 2/11 for confusion and "bizarre behavior".  CT head revealed a left frontal small hyperdense area, question SAH versus mass.  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  His other work-up revealed acute renal failure (improving).   Underwent Bronchoscopy 2/16 for obstructed right mainstem with Dr. Valeta Harms, with evidence of fully obstructed right lung from tumor originating from the medial portion of the main carina. During procedure patient developed Rapid A-fib with RVR.   If the patient was to have significant amount of bleeding from the endotracheal tube the endotracheal tube should be advanced into the left mainstem for unilateral left-sided lung ventilation.  Do NOT ADVANCE the endotracheal tube into the right mainstem  NO SUCTIONING on the mechanical ventilator into the airway  Past Medical History  COPD Chronic hypoxemic respiratory failure Hypertension Hyperlipidemia Atrial fibrillation Depression COVID-19 pneumonia in October 2020, did not require hospital admission  [facility] Events   2/12 Admit  2/16 Bronch with diagnosis of Sq cell cancer, [facility] 2/19 ETT tube exchange due to blown cuff 2/20 Dex d/cd  2/22 started on stress HC for low bp> changed back to dex per Tammi Klippel  Rec  2/24    Consults:  Neurology PCCM Radiation oncology Tammi Klippel)   Procedures:  Bronchoscopy 2/16, 2/19 ETT 2/16 >> Lt IJ CVL 2/16 >> Lt radial A aline 2/16 > 2/19 RT to Lung 2/17 and 18th held on 19th due to unstable rhythm  RT resumed 2/22 - last rx 2/26  D/c cardizem drip 2/25   Significant Diagnostic Tests:  CT chest [place]) 01/30/2020 >> 5.1 x 4.8 cm subcarinal mass that impacts the main carina and extrinsically compresses the right mainstem bronchus, right upper lobe airway and bronchus intermedius.  Unclear whether there is an endobronchial lesion.  Significant right lower lobe volume loss/atelectasis/collapse vs post-obstructive PNA.  Small right effusion MRI Brain w/o 2/12 >> approximate 10m cortically based hemorrhagic mass inovlving the anterior left frontal lobe, mild vasogenic edema w/o significant regional mass effect MRI Brain w/o 2/13 >> motion degraded exam, unchanged 14 mm cortically based hemorrhagic mass  EEG 2/13  wnl  Micro Data:  SARSCov2 2/12 >> negative Blood 2/12 >> negative  Respiratory 2/12 >> never sent  MRSA PCR 2/17 >>> neg BC x 2 2/25 >>> UC  2/25  Neg  Trach asp 2/25 >> few wbc's no org seen >>> Neg  Antimicrobials:  Ceftriaxone 2/12 x1 Azithromycin 2/12 > 2/20  Zosyn 2/13 >  2/22 Maxepime 2/25 >>> Vanc  2/25 >>>   Scheduled Meds: . bisoprolol  5 mg Oral Q12H  . chlorhexidine gluconate (MEDLINE KIT)  15 mL Mouth Rinse BID  . Chlorhexidine Gluconate Cloth  6 each Topical Daily  . dexamethasone (DECADRON) injection  4 mg Intravenous Q6H  . digoxin  0.25 mg Per Tube  Daily  . diltiazem  90 mg Per Tube Q6H  . docusate  50 mg Per Tube Daily  . feeding supplement (PRO-STAT SUGAR FREE 64)  60 mL Per Tube QID  . feeding supplement (VITAL HIGH PROTEIN)  1,000 mL Per Tube Q24H  . folic acid  1 mg Per Tube Daily  . free water  400 mL Per Tube Q4H  . insulin aspart  0-9 Units Subcutaneous Q4H  . insulin glargine  10 Units Subcutaneous BID  .  levalbuterol  0.63 mg Nebulization Q6H  . mouth rinse  15 mL Mouth Rinse 10 times per day  . multivitamin  15 mL Per Tube Daily  . mupirocin cream   Topical Daily  . pantoprazole sodium  40 mg Per Tube Daily  . sennosides  5 mL Per Tube Daily  . sodium chloride flush  10-40 mL Intracatheter Q12H  . vitamin B-12  1,000 mcg Per Tube Daily   Continuous Infusions: . sodium chloride Stopped (02/13/20 0957)  . ceFEPime (MAXIPIME) IV 200 mL/hr at Mar 01, 2020 0155  . dextrose 100 mL/hr at March 01, 2020 0710  . fentaNYL infusion INTRAVENOUS 200 mcg/hr (2020/03/01 0710)  . propofol (DIPRIVAN) infusion 30 mcg/kg/min (01-Mar-2020 0710)  . vancomycin Stopped (March 01, 2020 0100)   PRN Meds:.sodium chloride, acetaminophen **OR** acetaminophen, docusate, fentaNYL, metoprolol tartrate, ondansetron **OR** ondansetron (ZOFRAN) IV, sodium chloride flush   Interim history/subjective:   sedated on vent   Objective   Blood pressure 113/61, pulse 100, temperature 97.8 F (36.6 C), temperature source Oral, resp. rate 12, height 6' (1.829 m), weight (!) 151.4 kg, SpO2 91 %.   CVP:  [19 mmHg-23 mmHg] 19 mmHg  Vent Mode: PRVC FiO2 (%):  [30 %-40 %] 40 % Set Rate:  [12 bmp] 12 bmp Vt Set:  [620 mL] 620 mL PEEP:  [5 cmH20] 5 cmH20 Plateau Pressure:  [23 cmH20-27 cmH20] 26 cmH20   Intake/Output Summary (Last 24 hours) at 03-01-2020 0900 Last data filed at 01-Mar-2020 0710 Gross per 24 hour  Intake 7847.7 ml  Output 4525 ml  Net 3322.7 ml   Filed Weights   02/14/20 0452 02/15/20 0408 03/01/2020 0600  Weight: (!) 148.2 kg (!) 150 kg (!) 151.4 kg      Examination: Sedate/ 30 degrees hob. 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 -case reviewed with Neurosurgery, given no contrasted images and inability to determine actual size of mass vs contribution of hemorrhage. Dr. Nickolas Madrid ([place]) made aware of case as well to follow peripherally / for Tumor Board review, appreciate assistance. -underwent video bronch 2/16  -Remained intubated post bronchoscopy due to significant tumor presence  Plan: Minimal  endotracheal suctioning - ok to suction the et to just beyond the level of the end (reviewed with RT) If bleeding occurs advance ETT into left mainstem NOT RIGHT RT restarted 2/22 pm and q weekday thereafter/ discussed logistics with Dr Tammi Klippel who feels the pt should be back on dex due to head met not planning on CNS Radiation for now -   stopped RT 2/26 - fm meeting with Palliative care  to consider what will likely be terminal wean today    Acute respiratory failure with hypoxema/ vent dep due to obst RMSB/MO? /copd  Wean PEEP and FiO2 for sats greater than 90%.per ards protocol  Head of bed elevated 30 degrees. Plateau pressures less than 30 cm H20 > achieved  Ensure adequate pulmonary hygiene  VAP bundle in place  PAD protocol Continue nebs     Suspected postobstructive pneumonia with right lung collapse  Plan:  d/c'd  zosyn 2/22 p completing 10 days rx   >>> rx RT continue, no evidence of improved aeration of R lung but explained to fm 2/22 way to soon to "see if we'll win this battle... winning the war is another issue and much tougher fight" ie the big picture is not looking good but short term goal may still be achievable > get him off vent  - new fever 2/25 so repeated cultures/ started vanc/maxepime per flow sheet > new LL as dz on CT chest  Done for RT planning on 2/26 noted but covered for HCAP already  (see micro flow sheet) with neg sputum culture obtained just prior to restarting abx    Anemia Lab Results  Component Value Date   HGB 10.2  (L) 02/15/2020   HGB 10.1 (L) 02/14/2020   HGB  02/14/2020    QUESTIONABLE RESULTS, RECOMMEND RECOLLECT TO VERIFY    Plan Trend CBC Monitor for bleeding  Transfuse for HGB < 7 D/c'd lovenox 2/22 due to risk of recurrent bleeding airway/ brain met   COPD Plan: Changed brovana to xopenex qid 2/22 due to RAF D/c 'd pulmicort as already on Dex and risk iritating airway with high dose ICS       Circulatory shock  - CVP:  [19 mmHg-23 mmHg] 19 mmHg Remains off neo    May be a bit vol overloaded > lasix 40 mg IV am 2/28    New onset Atrial Fibrillation  -Developed during bronch  P:  Cardizem IV drip stopped 2/25 so just on cardizem per ft/ bisoprolol 5 mg bid   Added digoxin 2/23 > improved rate control, continue daily rx per pharmacy dosing  >>>  changed to bisoprolol for most selective BB available am 2/26  >> > increased cardizem 2/27       Acute metabolic Encephalopathy   -Suspect largely due to his left frontal lobe lesion.  Plan:  Decadron stopped 2/20 so added [facility] 2/22 > changed back to dex 2/24 at RT([place]) rec  Stopped  kepra 2/23 with  EEG nl    Goals of care Plan: Currently DNR Palliative care following  ? Wean off vent 2/28 pm or 3/1 am if not making progress     Nutrition Plan: Continue tube feeds/ added lantus 2/26 .   Hypernatremia acute new since admit - rx free water 2/24 but Na up further am 2/26 so rx increase free water and d5w 2/26  - resolved 2/28 > kvo d52    SCALE left buttock, not POA  - no evidence of  Deep tissue pressure injury      Best practice:  Diet: N.p.o.,  tube feed Pain/Anxiety/Delirium protocol (if indicated): Propofol, fentanyl VAP protocol (if indicated): Ordered DVT prophylaxis: SCDs only for now given bleeding risk brain and R MSB friable tumor GI prophylaxis: Protonix Glucose control:Monitor Mobility: Bed Code Status: Full Family Communication:  Discussed directly with pt's wife 2/26 plan for weekend including  palliative care involvement  Disposition: ICU  LABS  Glucose Recent Labs  Lab 02/15/20 1236 02/15/20 1623 02/15/20 1948 02/15/20 2339 2020/03/04 0345 03-04-20 0740  GLUCAP 147* 146* 138* 153* 160* 165*    BMET Recent Labs  Lab 02/14/20 0445 02/14/20 0722 02/15/20 0548  NA QUESTIONABLE RESULTS, RECOMMEND RECOLLECT TO VERIFY 151* 145  K QUESTIONABLE RESULTS, RECOMMEND RECOLLECT TO VERIFY 5.3* 5.1  CL QUESTIONABLE RESULTS, RECOMMEND RECOLLECT TO VERIFY 118* 113*  CO2 QUESTIONABLE RESULTS, RECOMMEND RECOLLECT TO VERIFY 27 26  BUN QUESTIONABLE RESULTS, RECOMMEND RECOLLECT TO VERIFY 57* 58*  CREATININE QUESTIONABLE RESULTS, RECOMMEND RECOLLECT TO VERIFY 0.66 0.70  GLUCOSE QUESTIONABLE RESULTS, RECOMMEND RECOLLECT TO VERIFY 194* 148*    Liver Enzymes No results for input(s): AST, ALT, ALKPHOS, BILITOT, ALBUMIN in the last 168 hours.  Electrolytes Recent Labs  Lab 02/09/20 1700 02/10/20 0528 02/12/20 0300 02/13/20 0415 02/13/20 0415 02/14/20 0445 02/14/20 0722 02/15/20 0548  CALCIUM  --  8.8*   < > 7.9*   < > QUESTIONABLE RESULTS, RECOMMEND RECOLLECT TO VERIFY 8.8* 8.5*  MG 2.6* 2.4  --  2.3  --   --   --   --   PHOS 3.9 2.7  --   --   --   --   --   --    < > = values in this interval not displayed.    CBC Recent Labs  Lab 02/14/20 0445 02/14/20 0722 02/15/20 0700  WBC QUESTIONABLE RESULTS, RECOMMEND RECOLLECT TO VERIFY 13.8* 14.2*  HGB QUESTIONABLE RESULTS, RECOMMEND RECOLLECT TO VERIFY 10.1* 10.2*  HCT QUESTIONABLE RESULTS, RECOMMEND RECOLLECT TO VERIFY 36.5* 36.8*  PLT QUESTIONABLE RESULTS, RECOMMEND RECOLLECT TO VERIFY 160 157    ABG No results for input(s): PHART, PCO2ART, PO2ART in the last 168 hours.  Coag's No results for input(s): APTT, INR in the last 168 hours.  Sepsis Markers No results for input(s): LATICACIDVEN, PROCALCITON, O2SATVEN in the last 168 hours.  Cardiac Enzymes No results for input(s): TROPONINI, PROBNP in the last 168  hours.      Christinia Gully, MD Pulmonary and [place] [phone] After 5:30 PM or weekends, use [facility] [phone]

## 2020-02-17 DEATH — deceased

## 2020-02-18 ENCOUNTER — Ambulatory Visit: Payer: Medicare HMO

## 2020-02-18 LAB — CULTURE, BLOOD (ROUTINE X 2)
Culture: NO GROWTH
Culture: NO GROWTH
Special Requests: ADEQUATE
Special Requests: ADEQUATE

## 2020-02-19 ENCOUNTER — Ambulatory Visit: Payer: Medicare HMO

## 2020-02-20 ENCOUNTER — Ambulatory Visit: Payer: Medicare HMO

## 2020-02-21 ENCOUNTER — Ambulatory Visit: Payer: Medicare HMO

## 2020-02-24 ENCOUNTER — Ambulatory Visit: Payer: Medicare HMO

## 2020-02-25 ENCOUNTER — Ambulatory Visit: Payer: Medicare HMO

## 2020-02-26 ENCOUNTER — Ambulatory Visit: Payer: Medicare HMO

## 2020-02-26 ENCOUNTER — Telehealth: Payer: Self-pay | Admitting: Radiation Oncology

## 2020-02-26 NOTE — Telephone Encounter (Signed)
Received call from Mrs. Shane Avila requesting someone complete paperwork reference passing of Mr. No. Phoned back. No answer. No option to leave a voicemail message. Will try again later.

## 2020-02-26 NOTE — Telephone Encounter (Signed)
Second attempt made to return Shane Avila's call. Shane Avila identifies as the patient's daughter. She answered the phone and explained she thought she needed Dr. Tammi Klippel to fill out FMLA paperwork for her but has since learned her work doesn't require it. She verbalized thanks for the care we provided her father. Expressed my condolences. Encouraged her to call back with any future needs reference the radiation her father received. She verbalized understanding and expressed appreciation for the call back.

## 2020-02-27 ENCOUNTER — Ambulatory Visit: Payer: Medicare HMO

## 2020-02-28 ENCOUNTER — Ambulatory Visit: Payer: Medicare HMO

## 2020-03-02 ENCOUNTER — Ambulatory Visit: Payer: Medicare HMO

## 2020-03-03 ENCOUNTER — Ambulatory Visit: Payer: Medicare HMO

## 2020-03-04 ENCOUNTER — Ambulatory Visit: Payer: Medicare HMO

## 2020-03-05 ENCOUNTER — Ambulatory Visit: Payer: Medicare HMO

## 2020-03-06 ENCOUNTER — Ambulatory Visit: Payer: Medicare HMO

## 2020-03-09 ENCOUNTER — Ambulatory Visit: Payer: Medicare HMO

## 2020-03-10 ENCOUNTER — Ambulatory Visit: Payer: Medicare HMO

## 2020-03-11 ENCOUNTER — Ambulatory Visit: Payer: Medicare HMO

## 2020-03-12 ENCOUNTER — Ambulatory Visit: Payer: Medicare HMO

## 2020-03-13 ENCOUNTER — Ambulatory Visit: Payer: Medicare HMO

## 2020-03-19 NOTE — Discharge Summary (Signed)
Physician Discharge Summary         Patient ID: Franciscojavier Wronski MRN: [id number] DOB/AGE: Oct 26, 1951 69 y.o.  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  Also with hypertension, hyperlipidemia, atrial fibrillation and depression.  He  wasundergoing work-up for a subcarinal, right hilar mass that was first discovered on CT chest 11/13/2019 at [place].   He was evaluated in the ED at [facility] 2/11 for confusion and "bizarre behavior".  CT head revealed a left frontal small hyperdense area, question SAH versus mass.  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  His other work-up revealed acute renal failure (improving).   Underwent Bronchoscopy 2/16 for obstructed right mainstem with Dr. Valeta Harms, with evidence of fully obstructed right lung from tumor originating from the medial portion of the main carina. During procedure patient developed Rapid A-fib with RVR and remained intubated.   He was treated with abx, bronchodilators, abx and RT as above but failed to improve and made NCB at fm request and compassionately extubated  2/28 and expired same day with comfort measures in place per the palliative care team.    Signed: Christinia Gully 02/20/2020, 8:19 AM
